# Patient Record
Sex: Male | Born: 1950 | Race: Black or African American | Hispanic: No | Smoking: Never smoker
Health system: Southern US, Community
[De-identification: ages and names within clinical notes are randomized; demographics above are authoritative.]

## PROBLEM LIST (undated history)

## (undated) DIAGNOSIS — G709 Myoneural disorder, unspecified: Secondary | ICD-10-CM

## (undated) DIAGNOSIS — F1910 Other psychoactive substance abuse, uncomplicated: Secondary | ICD-10-CM

## (undated) DIAGNOSIS — N183 Chronic kidney disease, stage 3 (moderate): Secondary | ICD-10-CM

## (undated) DIAGNOSIS — I10 Essential (primary) hypertension: Secondary | ICD-10-CM

## (undated) DIAGNOSIS — J45909 Unspecified asthma, uncomplicated: Secondary | ICD-10-CM

## (undated) DIAGNOSIS — K219 Gastro-esophageal reflux disease without esophagitis: Secondary | ICD-10-CM

## (undated) DIAGNOSIS — F1021 Alcohol dependence, in remission: Secondary | ICD-10-CM

## (undated) DIAGNOSIS — D518 Other vitamin B12 deficiency anemias: Secondary | ICD-10-CM

## (undated) DIAGNOSIS — E785 Hyperlipidemia, unspecified: Secondary | ICD-10-CM

## (undated) DIAGNOSIS — D509 Iron deficiency anemia, unspecified: Secondary | ICD-10-CM

## (undated) DIAGNOSIS — Z8719 Personal history of other diseases of the digestive system: Secondary | ICD-10-CM

## (undated) DIAGNOSIS — D649 Anemia, unspecified: Secondary | ICD-10-CM

## (undated) DIAGNOSIS — J309 Allergic rhinitis, unspecified: Secondary | ICD-10-CM

## (undated) DIAGNOSIS — F528 Other sexual dysfunction not due to a substance or known physiological condition: Secondary | ICD-10-CM

## (undated) DIAGNOSIS — E538 Deficiency of other specified B group vitamins: Secondary | ICD-10-CM

## (undated) DIAGNOSIS — M25569 Pain in unspecified knee: Secondary | ICD-10-CM

## (undated) DIAGNOSIS — Z86010 Personal history of colonic polyps: Secondary | ICD-10-CM

## (undated) HISTORY — DX: Essential (primary) hypertension: I10

## (undated) HISTORY — DX: Alcohol dependence, in remission: F10.21

## (undated) HISTORY — PX: OTHER SURGICAL HISTORY: SHX169

## (undated) HISTORY — DX: Unspecified asthma, uncomplicated: J45.909

## (undated) HISTORY — DX: Other vitamin B12 deficiency anemias: D51.8

## (undated) HISTORY — DX: Hyperlipidemia, unspecified: E78.5

## (undated) HISTORY — PX: VASECTOMY: SHX75

## (undated) HISTORY — DX: Other sexual dysfunction not due to a substance or known physiological condition: F52.8

## (undated) HISTORY — DX: Other psychoactive substance abuse, uncomplicated: F19.10

## (undated) HISTORY — DX: Gastro-esophageal reflux disease without esophagitis: K21.9

## (undated) HISTORY — DX: Anemia, unspecified: D64.9

## (undated) HISTORY — DX: Deficiency of other specified B group vitamins: E53.8

## (undated) HISTORY — DX: Pain in unspecified knee: M25.569

## (undated) HISTORY — PX: COLONOSCOPY: SHX174

## (undated) HISTORY — DX: Personal history of colonic polyps: Z86.010

## (undated) HISTORY — DX: Chronic kidney disease, stage 3 (moderate): N18.3

## (undated) HISTORY — DX: Morbid (severe) obesity due to excess calories: E66.01

## (undated) HISTORY — DX: Allergic rhinitis, unspecified: J30.9

## (undated) HISTORY — DX: Iron deficiency anemia, unspecified: D50.9

---

## 1998-09-12 ENCOUNTER — Emergency Department (HOSPITAL_COMMUNITY): Admission: EM | Admit: 1998-09-12 | Discharge: 1998-09-12 | Payer: Self-pay

## 1998-10-28 ENCOUNTER — Emergency Department (HOSPITAL_COMMUNITY): Admission: EM | Admit: 1998-10-28 | Discharge: 1998-10-28 | Payer: Self-pay | Admitting: Emergency Medicine

## 2000-05-26 ENCOUNTER — Emergency Department (HOSPITAL_COMMUNITY): Admission: EM | Admit: 2000-05-26 | Discharge: 2000-05-26 | Payer: Self-pay | Admitting: Emergency Medicine

## 2000-06-09 ENCOUNTER — Emergency Department (HOSPITAL_COMMUNITY): Admission: EM | Admit: 2000-06-09 | Discharge: 2000-06-10 | Payer: Self-pay | Admitting: Emergency Medicine

## 2000-06-10 ENCOUNTER — Encounter: Payer: Self-pay | Admitting: Emergency Medicine

## 2000-07-13 ENCOUNTER — Emergency Department (HOSPITAL_COMMUNITY): Admission: EM | Admit: 2000-07-13 | Discharge: 2000-07-13 | Payer: Self-pay | Admitting: Internal Medicine

## 2000-08-07 ENCOUNTER — Encounter: Payer: Self-pay | Admitting: Emergency Medicine

## 2000-08-07 ENCOUNTER — Emergency Department (HOSPITAL_COMMUNITY): Admission: EM | Admit: 2000-08-07 | Discharge: 2000-08-07 | Payer: Self-pay | Admitting: Emergency Medicine

## 2000-08-25 ENCOUNTER — Emergency Department (HOSPITAL_COMMUNITY): Admission: EM | Admit: 2000-08-25 | Discharge: 2000-08-25 | Payer: Self-pay | Admitting: Internal Medicine

## 2000-08-25 ENCOUNTER — Encounter: Payer: Self-pay | Admitting: Internal Medicine

## 2000-10-19 ENCOUNTER — Emergency Department (HOSPITAL_COMMUNITY): Admission: EM | Admit: 2000-10-19 | Discharge: 2000-10-20 | Payer: Self-pay | Admitting: Emergency Medicine

## 2000-10-21 ENCOUNTER — Emergency Department (HOSPITAL_COMMUNITY): Admission: EM | Admit: 2000-10-21 | Discharge: 2000-10-22 | Payer: Self-pay | Admitting: Emergency Medicine

## 2000-10-21 ENCOUNTER — Encounter: Payer: Self-pay | Admitting: Emergency Medicine

## 2000-12-17 ENCOUNTER — Emergency Department (HOSPITAL_COMMUNITY): Admission: EM | Admit: 2000-12-17 | Discharge: 2000-12-17 | Payer: Self-pay | Admitting: Emergency Medicine

## 2000-12-17 ENCOUNTER — Inpatient Hospital Stay (HOSPITAL_COMMUNITY): Admission: EM | Admit: 2000-12-17 | Discharge: 2000-12-21 | Payer: Self-pay | Admitting: Emergency Medicine

## 2000-12-20 ENCOUNTER — Encounter: Payer: Self-pay | Admitting: Internal Medicine

## 2001-08-18 ENCOUNTER — Encounter: Payer: Self-pay | Admitting: Pulmonary Disease

## 2001-08-18 ENCOUNTER — Ambulatory Visit (HOSPITAL_COMMUNITY): Admission: RE | Admit: 2001-08-18 | Discharge: 2001-08-18 | Payer: Self-pay | Admitting: Pulmonary Disease

## 2001-08-28 ENCOUNTER — Emergency Department (HOSPITAL_COMMUNITY): Admission: EM | Admit: 2001-08-28 | Discharge: 2001-08-28 | Payer: Self-pay | Admitting: Emergency Medicine

## 2001-08-28 ENCOUNTER — Encounter: Payer: Self-pay | Admitting: Emergency Medicine

## 2002-07-15 ENCOUNTER — Emergency Department (HOSPITAL_COMMUNITY): Admission: EM | Admit: 2002-07-15 | Discharge: 2002-07-16 | Payer: Self-pay | Admitting: Emergency Medicine

## 2002-07-16 ENCOUNTER — Encounter: Payer: Self-pay | Admitting: Emergency Medicine

## 2003-02-04 ENCOUNTER — Encounter: Payer: Self-pay | Admitting: Gastroenterology

## 2003-04-17 ENCOUNTER — Encounter: Payer: Self-pay | Admitting: Emergency Medicine

## 2003-04-17 ENCOUNTER — Emergency Department (HOSPITAL_COMMUNITY): Admission: EM | Admit: 2003-04-17 | Discharge: 2003-04-18 | Payer: Self-pay | Admitting: Emergency Medicine

## 2003-04-25 ENCOUNTER — Emergency Department (HOSPITAL_COMMUNITY): Admission: EM | Admit: 2003-04-25 | Discharge: 2003-04-25 | Payer: Self-pay | Admitting: Emergency Medicine

## 2004-06-21 ENCOUNTER — Encounter: Admission: RE | Admit: 2004-06-21 | Discharge: 2004-06-21 | Payer: Self-pay | Admitting: Internal Medicine

## 2004-06-30 ENCOUNTER — Encounter: Admission: RE | Admit: 2004-06-30 | Discharge: 2004-06-30 | Payer: Self-pay | Admitting: Internal Medicine

## 2005-11-28 ENCOUNTER — Ambulatory Visit: Payer: Self-pay | Admitting: Internal Medicine

## 2005-11-28 LAB — CONVERTED CEMR LAB: PSA: 0.57 ng/mL

## 2006-07-08 ENCOUNTER — Ambulatory Visit: Payer: Self-pay | Admitting: Internal Medicine

## 2006-07-14 ENCOUNTER — Ambulatory Visit: Payer: Self-pay | Admitting: Internal Medicine

## 2006-08-14 ENCOUNTER — Ambulatory Visit: Payer: Self-pay | Admitting: Gastroenterology

## 2006-09-02 ENCOUNTER — Encounter (INDEPENDENT_AMBULATORY_CARE_PROVIDER_SITE_OTHER): Payer: Self-pay | Admitting: Specialist

## 2006-09-02 ENCOUNTER — Ambulatory Visit: Payer: Self-pay | Admitting: Gastroenterology

## 2007-09-21 ENCOUNTER — Ambulatory Visit: Payer: Self-pay | Admitting: Internal Medicine

## 2007-09-21 LAB — CONVERTED CEMR LAB
ALT: 17 units/L (ref 0–53)
AST: 23 units/L (ref 0–37)
Albumin: 4 g/dL (ref 3.5–5.2)
Alkaline Phosphatase: 54 units/L (ref 39–117)
BUN: 6 mg/dL (ref 6–23)
Basophils Absolute: 0 10*3/uL (ref 0.0–0.1)
Basophils Relative: 0.5 % (ref 0.0–1.0)
Bilirubin Urine: NEGATIVE
Bilirubin, Direct: 0.1 mg/dL (ref 0.0–0.3)
CO2: 31 meq/L (ref 19–32)
Calcium: 9.3 mg/dL (ref 8.4–10.5)
Chloride: 107 meq/L (ref 96–112)
Cholesterol: 204 mg/dL (ref 0–200)
Creatinine, Ser: 1.3 mg/dL (ref 0.4–1.5)
Direct LDL: 126.7 mg/dL
Eosinophils Absolute: 0.1 10*3/uL (ref 0.0–0.6)
Eosinophils Relative: 1.5 % (ref 0.0–5.0)
GFR calc Af Amer: 74 mL/min
GFR calc non Af Amer: 61 mL/min
Glucose, Bld: 97 mg/dL (ref 70–99)
HCT: 35.2 % — ABNORMAL LOW (ref 39.0–52.0)
HDL: 39.8 mg/dL (ref >39.0)
Hemoglobin, Urine: NEGATIVE
Hemoglobin: 11.5 g/dL — ABNORMAL LOW (ref 13.0–17.0)
Ketones, ur: NEGATIVE mg/dL
Leukocytes, UA: NEGATIVE
Lymphocytes Relative: 41.4 % (ref 12.0–46.0)
MCHC: 32.8 g/dL (ref 30.0–36.0)
MCV: 80.2 fL (ref 78.0–100.0)
Monocytes Absolute: 0.4 10*3/uL (ref 0.2–0.7)
Monocytes Relative: 5.2 % (ref 3.0–11.0)
Neutro Abs: 3.8 10*3/uL (ref 1.4–7.7)
Neutrophils Relative %: 51.4 % (ref 43.0–77.0)
Nitrite: NEGATIVE
PSA: 0.6 ng/mL
PSA: 0.6 ng/mL (ref 0.10–4.00)
Platelets: 265 10*3/uL (ref 150–400)
Potassium: 4.5 meq/L (ref 3.5–5.1)
RBC: 4.39 M/uL (ref 4.22–5.81)
RDW: 14.2 % (ref 11.5–14.6)
Sodium: 145 meq/L (ref 135–145)
Specific Gravity, Urine: 1.015 (ref 1.000–1.03)
Total Bilirubin: 0.8 mg/dL (ref 0.3–1.2)
Total CHOL/HDL Ratio: 5.1
Total Protein, Urine: NEGATIVE mg/dL
Total Protein: 7.5 g/dL (ref 6.0–8.3)
Triglycerides: 188 mg/dL — ABNORMAL HIGH (ref 0–149)
Urine Glucose: NEGATIVE mg/dL
Urobilinogen, UA: 0.2 (ref 0.0–1.0)
VLDL: 38 mg/dL (ref 0–40)
WBC: 7.3 10*3/uL (ref 4.5–10.5)
pH: 7.5 (ref 5.0–8.0)

## 2007-09-24 ENCOUNTER — Ambulatory Visit: Payer: Self-pay | Admitting: Internal Medicine

## 2007-10-03 ENCOUNTER — Encounter: Payer: Self-pay | Admitting: Internal Medicine

## 2007-10-03 DIAGNOSIS — J309 Allergic rhinitis, unspecified: Secondary | ICD-10-CM | POA: Insufficient documentation

## 2007-10-03 DIAGNOSIS — Z860100 Personal history of colon polyps, unspecified: Secondary | ICD-10-CM | POA: Insufficient documentation

## 2007-10-03 DIAGNOSIS — F528 Other sexual dysfunction not due to a substance or known physiological condition: Secondary | ICD-10-CM

## 2007-10-03 DIAGNOSIS — I10 Essential (primary) hypertension: Secondary | ICD-10-CM | POA: Insufficient documentation

## 2007-10-03 DIAGNOSIS — J45909 Unspecified asthma, uncomplicated: Secondary | ICD-10-CM

## 2007-10-03 DIAGNOSIS — E785 Hyperlipidemia, unspecified: Secondary | ICD-10-CM

## 2007-10-03 DIAGNOSIS — F1021 Alcohol dependence, in remission: Secondary | ICD-10-CM | POA: Insufficient documentation

## 2007-10-03 DIAGNOSIS — K219 Gastro-esophageal reflux disease without esophagitis: Secondary | ICD-10-CM

## 2007-10-03 HISTORY — DX: Personal history of colon polyps, unspecified: Z86.0100

## 2007-10-03 HISTORY — DX: Gastro-esophageal reflux disease without esophagitis: K21.9

## 2007-10-03 HISTORY — DX: Allergic rhinitis, unspecified: J30.9

## 2007-10-03 HISTORY — DX: Hyperlipidemia, unspecified: E78.5

## 2007-10-03 HISTORY — DX: Personal history of colonic polyps: Z86.010

## 2007-10-03 HISTORY — DX: Morbid (severe) obesity due to excess calories: E66.01

## 2007-10-03 HISTORY — DX: Unspecified asthma, uncomplicated: J45.909

## 2007-10-03 HISTORY — DX: Essential (primary) hypertension: I10

## 2007-10-03 HISTORY — DX: Other sexual dysfunction not due to a substance or known physiological condition: F52.8

## 2007-10-03 HISTORY — DX: Alcohol dependence, in remission: F10.21

## 2007-10-08 ENCOUNTER — Ambulatory Visit: Payer: Self-pay | Admitting: Internal Medicine

## 2007-10-08 ENCOUNTER — Encounter: Payer: Self-pay | Admitting: Internal Medicine

## 2007-10-08 DIAGNOSIS — D649 Anemia, unspecified: Secondary | ICD-10-CM

## 2007-10-08 DIAGNOSIS — M25569 Pain in unspecified knee: Secondary | ICD-10-CM | POA: Insufficient documentation

## 2007-10-08 HISTORY — DX: Anemia, unspecified: D64.9

## 2007-10-08 HISTORY — DX: Pain in unspecified knee: M25.569

## 2007-10-13 ENCOUNTER — Encounter: Payer: Self-pay | Admitting: Internal Medicine

## 2007-12-17 HISTORY — PX: OTHER SURGICAL HISTORY: SHX169

## 2007-12-30 ENCOUNTER — Encounter: Payer: Self-pay | Admitting: Internal Medicine

## 2008-01-27 ENCOUNTER — Encounter: Payer: Self-pay | Admitting: Internal Medicine

## 2008-01-27 ENCOUNTER — Encounter: Admission: RE | Admit: 2008-01-27 | Discharge: 2008-01-27 | Payer: Self-pay | Admitting: Orthopedic Surgery

## 2008-02-24 ENCOUNTER — Encounter: Payer: Self-pay | Admitting: Internal Medicine

## 2008-04-27 ENCOUNTER — Encounter: Payer: Self-pay | Admitting: Internal Medicine

## 2008-06-20 ENCOUNTER — Telehealth (INDEPENDENT_AMBULATORY_CARE_PROVIDER_SITE_OTHER): Payer: Self-pay | Admitting: *Deleted

## 2008-09-27 ENCOUNTER — Ambulatory Visit: Payer: Self-pay | Admitting: Internal Medicine

## 2008-09-28 LAB — CONVERTED CEMR LAB
ALT: 14 units/L (ref 0–53)
AST: 19 units/L (ref 0–37)
Albumin: 4.1 g/dL (ref 3.5–5.2)
Alkaline Phosphatase: 52 units/L (ref 39–117)
BUN: 10 mg/dL (ref 6–23)
Basophils Absolute: 0.1 10*3/uL (ref 0.0–0.1)
Basophils Relative: 1.1 % (ref 0.0–3.0)
Bilirubin Urine: NEGATIVE
Bilirubin, Direct: 0.1 mg/dL (ref 0.0–0.3)
CO2: 30 meq/L (ref 19–32)
Calcium: 9 mg/dL (ref 8.4–10.5)
Chloride: 106 meq/L (ref 96–112)
Cholesterol: 174 mg/dL (ref 0–200)
Creatinine, Ser: 1.3 mg/dL (ref 0.4–1.5)
Direct LDL: 96.8 mg/dL
Eosinophils Absolute: 0.1 10*3/uL (ref 0.0–0.7)
Eosinophils Relative: 1.6 % (ref 0.0–5.0)
Folate: 5.8 ng/mL
GFR calc Af Amer: 73 mL/min
GFR calc non Af Amer: 61 mL/min
Glucose, Bld: 92 mg/dL (ref 70–99)
HCT: 33 % — ABNORMAL LOW (ref 39.0–52.0)
HDL: 37 mg/dL — ABNORMAL LOW (ref >39.0)
Hemoglobin, Urine: NEGATIVE
Hemoglobin: 11.2 g/dL — ABNORMAL LOW (ref 13.0–17.0)
Iron: 51 ug/dL (ref 42–165)
Ketones, ur: NEGATIVE mg/dL
Leukocytes, UA: NEGATIVE
Lymphocytes Relative: 38.8 % (ref 12.0–46.0)
MCHC: 33.9 g/dL (ref 30.0–36.0)
MCV: 80.7 fL (ref 78.0–100.0)
Monocytes Absolute: 0.4 10*3/uL (ref 0.1–1.0)
Monocytes Relative: 5.2 % (ref 3.0–12.0)
Neutro Abs: 3.6 10*3/uL (ref 1.4–7.7)
Neutrophils Relative %: 53.3 % (ref 43.0–77.0)
Nitrite: NEGATIVE
PSA: 0.55 ng/mL (ref 0.10–4.00)
Platelets: 245 10*3/uL (ref 150–400)
Potassium: 4.4 meq/L (ref 3.5–5.1)
RBC: 4.09 M/uL — ABNORMAL LOW (ref 4.22–5.81)
RDW: 14.4 % (ref 11.5–14.6)
Saturation Ratios: 12.2 % — ABNORMAL LOW (ref 20.0–50.0)
Sodium: 143 meq/L (ref 135–145)
Specific Gravity, Urine: 1.025 (ref 1.000–1.03)
TSH: 3.13 microintl units/mL (ref 0.35–5.50)
Total Bilirubin: 0.7 mg/dL (ref 0.3–1.2)
Total CHOL/HDL Ratio: 4.7
Total Protein, Urine: NEGATIVE mg/dL
Total Protein: 7.1 g/dL (ref 6.0–8.3)
Transferrin: 298.6 mg/dL (ref >=212.0)
Triglycerides: 246 mg/dL (ref 0–149)
Urine Glucose: NEGATIVE mg/dL
Urobilinogen, UA: 0.2 (ref 0.0–1.0)
VLDL: 49 mg/dL — ABNORMAL HIGH (ref 0–40)
Vitamin B-12: 181 pg/mL — ABNORMAL LOW (ref 211–911)
WBC: 6.9 10*3/uL (ref 4.5–10.5)
pH: 6 (ref 5.0–8.0)

## 2008-10-06 ENCOUNTER — Ambulatory Visit: Payer: Self-pay | Admitting: Internal Medicine

## 2008-10-06 DIAGNOSIS — E538 Deficiency of other specified B group vitamins: Secondary | ICD-10-CM

## 2008-10-06 HISTORY — DX: Deficiency of other specified B group vitamins: E53.8

## 2008-11-24 ENCOUNTER — Encounter: Payer: Self-pay | Admitting: Internal Medicine

## 2009-03-16 HISTORY — PX: OTHER SURGICAL HISTORY: SHX169

## 2009-04-18 ENCOUNTER — Inpatient Hospital Stay (HOSPITAL_COMMUNITY): Admission: RE | Admit: 2009-04-18 | Discharge: 2009-04-23 | Payer: Self-pay | Admitting: Orthopedic Surgery

## 2009-09-25 ENCOUNTER — Telehealth: Payer: Self-pay | Admitting: Internal Medicine

## 2009-09-26 ENCOUNTER — Telehealth: Payer: Self-pay | Admitting: Internal Medicine

## 2009-10-30 ENCOUNTER — Telehealth: Payer: Self-pay | Admitting: Internal Medicine

## 2009-11-23 ENCOUNTER — Ambulatory Visit: Payer: Self-pay | Admitting: Internal Medicine

## 2009-11-23 LAB — CONVERTED CEMR LAB
ALT: 18 units/L (ref 0–53)
AST: 22 units/L (ref 0–37)
Albumin: 3.9 g/dL (ref 3.5–5.2)
Alkaline Phosphatase: 52 units/L (ref 39–117)
BUN: 5 mg/dL — ABNORMAL LOW (ref 6–23)
Basophils Absolute: 0 10*3/uL (ref 0.0–0.1)
Basophils Relative: 0.7 % (ref 0.0–3.0)
Bilirubin Urine: NEGATIVE
Bilirubin, Direct: 0.1 mg/dL (ref 0.0–0.3)
CO2: 30 meq/L (ref 19–32)
Calcium: 9.2 mg/dL (ref 8.4–10.5)
Chloride: 107 meq/L (ref 96–112)
Cholesterol: 187 mg/dL (ref 0–200)
Creatinine, Ser: 1.4 mg/dL (ref 0.4–1.5)
Eosinophils Absolute: 0.1 10*3/uL (ref 0.0–0.7)
Eosinophils Relative: 1.5 % (ref 0.0–5.0)
GFR calc non Af Amer: 67.1 mL/min (ref >60)
Glucose, Bld: 94 mg/dL (ref 70–99)
HCT: 30.9 % — ABNORMAL LOW (ref 39.0–52.0)
HDL: 45.5 mg/dL (ref >39.00)
Hemoglobin, Urine: NEGATIVE
Hemoglobin: 10 g/dL — ABNORMAL LOW (ref 13.0–17.0)
Ketones, ur: NEGATIVE mg/dL
LDL Cholesterol: 118 mg/dL — ABNORMAL HIGH (ref 0–99)
Leukocytes, UA: NEGATIVE
Lymphocytes Relative: 41 % (ref 12.0–46.0)
Lymphs Abs: 2.6 10*3/uL (ref 0.7–4.0)
MCHC: 32.4 g/dL (ref 30.0–36.0)
MCV: 79 fL (ref 78.0–100.0)
Monocytes Absolute: 0.3 10*3/uL (ref 0.1–1.0)
Monocytes Relative: 5.5 % (ref 3.0–12.0)
Neutro Abs: 3.3 10*3/uL (ref 1.4–7.7)
Neutrophils Relative %: 51.3 % (ref 43.0–77.0)
Nitrite: NEGATIVE
PSA: 0.68 ng/mL (ref 0.10–4.00)
Platelets: 242 10*3/uL (ref 150.0–400.0)
Potassium: 4.5 meq/L (ref 3.5–5.1)
RBC: 3.91 M/uL — ABNORMAL LOW (ref 4.22–5.81)
RDW: 16.4 % — ABNORMAL HIGH (ref 11.5–14.6)
Sodium: 145 meq/L (ref 135–145)
Specific Gravity, Urine: 1.015 (ref 1.000–1.030)
TSH: 1.56 microintl units/mL (ref 0.35–5.50)
Total Bilirubin: 0.7 mg/dL (ref 0.3–1.2)
Total CHOL/HDL Ratio: 4
Total Protein, Urine: NEGATIVE mg/dL
Total Protein: 7.4 g/dL (ref 6.0–8.3)
Triglycerides: 120 mg/dL (ref 0.0–149.0)
Urine Glucose: NEGATIVE mg/dL
Urobilinogen, UA: 0.2 (ref 0.0–1.0)
VLDL: 24 mg/dL (ref 0.0–40.0)
WBC: 6.3 10*3/uL (ref 4.5–10.5)
pH: 6.5 (ref 5.0–8.0)

## 2009-11-24 LAB — CONVERTED CEMR LAB
Folate: 4.8 ng/mL
Iron: 38 ug/dL — ABNORMAL LOW (ref 42–165)
Saturation Ratios: 9.1 % — ABNORMAL LOW (ref 20.0–50.0)
Transferrin: 298.9 mg/dL (ref 212.0–360.0)
Vitamin B-12: 188 pg/mL — ABNORMAL LOW (ref 211–911)

## 2009-11-27 ENCOUNTER — Ambulatory Visit: Payer: Self-pay | Admitting: Internal Medicine

## 2009-11-27 DIAGNOSIS — D509 Iron deficiency anemia, unspecified: Secondary | ICD-10-CM | POA: Insufficient documentation

## 2009-11-27 HISTORY — DX: Iron deficiency anemia, unspecified: D50.9

## 2009-12-04 ENCOUNTER — Ambulatory Visit: Payer: Self-pay | Admitting: Gastroenterology

## 2009-12-04 ENCOUNTER — Encounter (INDEPENDENT_AMBULATORY_CARE_PROVIDER_SITE_OTHER): Payer: Self-pay | Admitting: *Deleted

## 2009-12-04 DIAGNOSIS — D518 Other vitamin B12 deficiency anemias: Secondary | ICD-10-CM

## 2009-12-04 HISTORY — DX: Other vitamin B12 deficiency anemias: D51.8

## 2009-12-04 LAB — CONVERTED CEMR LAB: IgA: 404 mg/dL — ABNORMAL HIGH (ref 68–378)

## 2009-12-05 LAB — CONVERTED CEMR LAB: Tissue Transglutaminase Ab, IgA: 1.3 units (ref <7)

## 2009-12-25 ENCOUNTER — Telehealth: Payer: Self-pay | Admitting: Internal Medicine

## 2010-01-03 ENCOUNTER — Ambulatory Visit: Payer: Self-pay | Admitting: Gastroenterology

## 2010-01-03 HISTORY — PX: POLYPECTOMY: SHX149

## 2010-01-03 LAB — HM COLONOSCOPY

## 2010-01-04 ENCOUNTER — Encounter: Payer: Self-pay | Admitting: Gastroenterology

## 2010-05-01 ENCOUNTER — Telehealth: Payer: Self-pay | Admitting: Internal Medicine

## 2011-01-02 ENCOUNTER — Telehealth: Payer: Self-pay | Admitting: Internal Medicine

## 2011-01-06 ENCOUNTER — Encounter: Payer: Self-pay | Admitting: Internal Medicine

## 2011-01-17 NOTE — Procedures (Signed)
Summary: Colonoscopy and biopsy   Colonoscopy  Procedure date:  09/02/2006  Findings:      Results: Polyp.  Results: Diverticulosis.       Location:  [facility].    Procedures Next Due Date:    Colonoscopy: 09/2011  Colonoscopy  Procedure date:  09/02/2006  Findings:      Results: Polyp.  Results: Diverticulosis.       Location:  [facility].    Procedures Next Due Date:    Colonoscopy: 09/2011 Patient Name: Darrell Hoffman, Darrell Hoffman MRN:  Procedure Procedures: Colonoscopy CPT: [phone].    with Hot Biopsy(s)CPT: [id number].  Personnel: Endoscopist: Venita Lick. Russella Dar, MD, Clementeen Graham.  Exam Location: Exam performed in Outpatient Clinic. Outpatient  Patient Consent: Procedure, Alternatives, Risks and Benefits discussed, consent obtained, from patient. Consent was obtained by the RN.  Indications  Surveillance of: Adenomatous Polyp(s). Initial polypectomy was performed in 2004. in Feb. Largest polyp removed was > 19 mm. Pathology of worst  polyp: tubulovillous adenoma.  History  Current Medications: Patient is not currently taking Coumadin.  Pre-Exam Physical: Performed Sep 02, 2006. Cardio-pulmonary exam, Rectal exam, HEENT exam , Abdominal exam, Mental status exam WNL.  Comments: Pt. history reviewed/updated, physical exam performed prior to initiation of sedation?Yes Exam Exam: Extent of exam reached: Cecum, extent intended: Cecum.  The cecum was identified by appendiceal orifice and IC valve. Time to Cecum: 00:02: 04. Time for Withdrawl: 00:09:35. Colon retroflexion performed. ASA Classification: II. Tolerance: excellent.  Monitoring: Pulse and BP monitoring, Oximetry used. Supplemental O2 given.  Colon Prep Used MoviPrep for colon prep. Prep results: excellent.  Sedation Meds: Patient assessed and found to be appropriate for moderate (conscious) sedation. Fentanyl 75 mcg. given IV. Versed 6 mg. given IV.  Findings DIVERTICULOSIS: Transverse  Colon. Not bleeding. ICD9: Diverticulosis: 562.10. Comments: single wide mouthed diverticulum.  POLYP: Transverse Colon, Maximum size: 3 mm. sessile polyp. Procedure:  hot biopsy, removed, retrieved, Polyp sent to pathology. ICD9: Colon Polyps: 211.3.  NORMAL EXAM: Cecum to Hepatic Flexure.  NORMAL EXAM: Splenic Flexure to Rectum.   Assessment  Diagnoses: 562.10: Diverticulosis.  211.3: Colon Polyps.   Events  Unplanned Interventions: No intervention was required.  Unplanned Events: There were no complications. Plans  Post Exam Instructions: Post sedation instructions given. No aspirin or non-steroidal containing medications: 2 weeks.  Medication Plan: Await pathology.  Patient Education: Patient given standard instructions for: Polyps. Diverticulosis.  Disposition: After procedure patient sent to recovery. After recovery patient sent home.  Scheduling/Referral: Colonoscopy, to [facility] T. Russella Dar, MD, [facility], around Sep 03, 2011.    This report was created from the original endoscopy report, which was reviewed and signed by the above listed endoscopist.    cc: [name], MD         SP Surgical Pathology - STATUS: Final             By: Windy Kalata,      Perform Date: 104Sep07 00:01  Ordered By: Rica Records Date: 19Sep07 14:06  Facility: [facility]                               Department: CPATH  Service Report Text  [facility]   [address] [address]   [place], [place] [address]   Telephone [phone] or [phone] Fax [phone]  REPORT OF SURGICAL PATHOLOGY    Case #: [id number]   Patient Name: [name], [name]   Office Chart Number: [id number]    MRN: [id number]   Pathologist: Alden Server A. Delila Spence, MD   DOB/Age 19-Oct-1951 (Age: 60) Gender: M   Date Taken: 09/02/2006   Date Received: 09/03/2006    FINAL DIAGNOSIS    ***MICROSCOPIC EXAMINATION AND DIAGNOSIS***    TRANSVERSE COLON, POLYP(S):  HYPERPLASTIC POLYP(S). NO   ADENOMATOUS CHANGE OR MALIGNANCY IDENTIFIED.    cf   Date Reported: 09/04/2006 Alden Server A. Delila Spence, MD   *** Electronically Signed Out By EAA ***    Clinical information   History of colon polyp. R/O adenomatous changes (jy)    specimen(s) obtained   Colon, polyp(s), transverse    Gross Description   Received in formalin is a tan, soft tissue fragment that is   submitted in toto. Size: 0.2 cm. One block.   (TB:caf 09/03/06)    cf/

## 2011-01-17 NOTE — Consult Note (Signed)
Summary: [facility]   Imported By: Esmeralda Links D'jimraou 01/05/2008 12:02:12  _____________________________________________________________________  External Attachment:    Type:   Image     Comment:   External Document

## 2011-01-17 NOTE — Progress Notes (Signed)
  Phone Note Call from Patient Call back at Home Phone [phone]   Caller: [phone] Call For: [name] MD Summary of Call: Pt sched appt for CPX w/Dr Jonny Ruiz, the next available slot is April,pt made appt in April. Pt needs heartburn meds called in to pharmacy as he is out. Initial call taken by: Verdell Face,  January 02, 2011 9:48 AM    Prescriptions: OMEPRAZOLE 20 MG CPDR (OMEPRAZOLE) 1 by mouth 2 times a day  #60 x 4   Entered by:   Scharlene Gloss CMA (AAMA)   Authorized by:   [name] MD   Signed by:   Scharlene Gloss CMA (AAMA) on 01/02/2011   Method used:   Faxed to ...       [facility] [phone]* (retail)       [address]       [address]       [place], [place]  [address]       Ph: [phone]       Fax: [phone]   RxID:   [id number]

## 2011-01-17 NOTE — Assessment & Plan Note (Signed)
Summary: CPX/ NWS /#   Vital Signs:  Patient profile:   60 year old male Height:      65 inches Weight:      218 pounds BMI:     36.41 O2 Sat:      98 % on Room air Temp:     98.5 degrees F oral Pulse rate:   74 / minute BP sitting:   142 / 84  (left arm) Cuff size:   large  Vitals Entered ByZella Ball Ewing (November 27, 2009 10:50 AM)  O2 Flow:  Room air  CC: Adult Physical/RE   CC:  Adult Physical/RE.  History of Present Illness: overall doing well, no complaints  Problems Prior to Update: 1)  Anemia-iron Deficiency  (ICD-280.9) 2)  B12 Deficiency  (ICD-266.2) 3)  Preventive Health Care  (ICD-V70.0) 4)  Knee Pain, Right  (ICD-719.46) 5)  Family History of Cad Male 1st Degree Relative <60  (ICD-V16.49) 6)  Anemia-nos  (ICD-285.9) 7)  Alcohol Abuse, Hx of  (ICD-V11.3) 8)  Colonic Polyps, Hx of  (ICD-V12.72) 9)  Hyperlipidemia  (ICD-272.4) 10)  Hypertension  (ICD-401.9) 11)  Erectile Dysfunction  (ICD-302.72) 12)  Morbid Obesity  (ICD-278.01) 13)  Allergic Rhinitis  (ICD-477.9) 14)  Gerd  (ICD-530.81) 15)  Asthma  (ICD-493.90)  Medications Prior to Update: 1)  Advair Diskus 500-50 Mcg/dose Misc (Fluticasone-Salmeterol) .Marland Kitchen.. 1 Puff Two Times A Day 2)  Cialis 20 Mg Tabs (Tadalafil) .Marland Kitchen.. 1 By Mouth Once Daily As Needed 3)  Fluticasone Propionate 50 Mcg/act Susp (Fluticasone Propionate) .... 2 Spray/side Once Daily 4)  Lansoprazole 30 Mg Cpdr (Lansoprazole) .Marland Kitchen.. 1po Once Daily 5)  Pravastatin Sodium 40 Mg Tabs (Pravastatin Sodium) .... Take 1 Tablet By Mouth Once A Day 6)  Proventil Hfa 108 (90 Base) Mcg/act Aers (Albuterol Sulfate) .... Inhale 2 Puff As Directed Four Times A Day 7)  Singulair 10 Mg Tabs (Montelukast Sodium) .Marland Kitchen.. 1 By Mouth Once Daily 8)  Diclofenac Sodium 75 Mg  Tbec (Diclofenac Sodium) .Marland Kitchen.. 1 By Mouth Two Times A Day Prn 9)  Cyanocobalamin 1000 Mcg Q Mo 10)  Adult Aspirin Ec Low Strength 81 Mg Tbec (Aspirin) .Marland Kitchen.. 1 By Mouth Once Daily 11)   Omeprazole 20 Mg Cpdr (Omeprazole) .Marland Kitchen.. 1 By Mouth 2 Times A Day  Current Medications (verified): 1)  Advair Diskus 500-50 Mcg/dose Misc (Fluticasone-Salmeterol) .Marland Kitchen.. 1 Puff Two Times A Day 2)  Cialis 20 Mg Tabs (Tadalafil) .Marland Kitchen.. 1 By Mouth Once Daily As Needed 3)  Fluticasone Propionate 50 Mcg/act Susp (Fluticasone Propionate) .... 2 Spray/side Once Daily 4)  Lansoprazole 30 Mg Cpdr (Lansoprazole) .Marland Kitchen.. 1po Once Daily 5)  Pravastatin Sodium 40 Mg Tabs (Pravastatin Sodium) .... Take 1 Tablet By Mouth Once A Day 6)  Proventil Hfa 108 (90 Base) Mcg/act Aers (Albuterol Sulfate) .... Inhale 2 Puff As Directed Four Times A Day 7)  Singulair 10 Mg Tabs (Montelukast Sodium) .Marland Kitchen.. 1 By Mouth Once Daily 8)  Diclofenac Sodium 75 Mg  Tbec (Diclofenac Sodium) .Marland Kitchen.. 1 By Mouth Two Times A Day Prn 9)  Cyanocobalamin 1000 Mcg Q Mo 10)  Adult Aspirin Ec Low Strength 81 Mg Tbec (Aspirin) .Marland Kitchen.. 1 By Mouth Once Daily 11)  Omeprazole 20 Mg Cpdr (Omeprazole) .Marland Kitchen.. 1 By Mouth 2 Times A Day 12)  Ferrous Sulfate 325 (65 Fe) Mg Tabs (Ferrous Sulfate) .Marland Kitchen.. 1po Once Daily  Allergies (verified): No Known Drug Allergies  Past History:  Family History: Last updated: 10/06/2008 Family History of CAD Male  1st degree relative - MI at 63 yo sister with SLE  2 cousins with DM mother with cancer ? breast ocusin and uncle with ETOH sister with HTN  Social History: Last updated: 10/06/2008 Never Smoked Alcohol use-no Divorced 2 children work Games developer - [employer]  Risk Factors: Smoking Status: never (10/08/2007)  Past Medical History: Asthma GERD Allergic rhinitis Obesity Erectile Dysfunction Hypertension Hyperlipidemia Colonic polyps, hx of Hx of Alcohol Dependence Anemia-NOS Anemia-iron deficiency  Past Surgical History: Vasectomy Bilateral Hydrocele Repair s/p right knee surgury   - TKR  - april 2010 s/p left knee arthroscopy 2009  Review of Systems  The patient denies  anorexia, fever, weight loss, weight gain, vision loss, decreased hearing, hoarseness, chest pain, syncope, dyspnea on exertion, peripheral edema, prolonged cough, headaches, hemoptysis, abdominal pain, melena, hematochezia, severe indigestion/heartburn, hematuria, incontinence, muscle weakness, suspicious skin lesions, transient blindness, difficulty walking, depression, unusual weight change, abnormal bleeding, enlarged lymph nodes, and angioedema.         all otherwise negative per pt - except for some hand joint pains off and on  Physical Exam  General:  alert and overweight-appearing.   Head:  normocephalic and atraumatic.   Eyes:  vision grossly intact, pupils equal, and pupils round.   Ears:  R ear normal and L ear normal.   Nose:  no external deformity and no nasal discharge.   Mouth:  no gingival abnormalities and pharynx pink and moist.   Neck:  supple and no masses.   Lungs:  normal respiratory effort and normal breath sounds.   Heart:  normal rate and regular rhythm.   Abdomen:  soft, non-tender, and normal bowel sounds.   Msk:  no joint tenderness and no joint swelling.   Extremities:  no edema, no erythema  Neurologic:  cranial nerves II-XII intact and strength normal in all extremities.     Impression & Recommendations:  Problem # 1:  Preventive Health Care (ICD-V70.0)  Overall doing well, up to date, counseled on routine health concerns for screening and prevention, immunizations up to date or declined, labs reviewed, ecg reviewed  Orders: EKG w/ Interpretation (93000)  Problem # 2:  B12 DEFICIENCY (ICD-266.2) to start b12 shots monthly  Problem # 3:  ANEMIA-IRON DEFICIENCY (ICD-280.9)  His updated medication list for this problem includes:    Ferrous Sulfate 325 (65 Fe) Mg Tabs (Ferrous sulfate) .Marland Kitchen... 1po once daily  Orders: Gastroenterology Referral (GI)  Complete Medication List: 1)  Advair Diskus 500-50 Mcg/dose Misc (Fluticasone-salmeterol) .Marland Kitchen.. 1 puff  two times a day 2)  Cialis 20 Mg Tabs (Tadalafil) .Marland Kitchen.. 1 by mouth once daily as needed 3)  Fluticasone Propionate 50 Mcg/act Susp (Fluticasone propionate) .... 2 spray/side once daily 4)  Lansoprazole 30 Mg Cpdr (Lansoprazole) .Marland Kitchen.. 1po once daily 5)  Pravastatin Sodium 40 Mg Tabs (Pravastatin sodium) .... Take 1 tablet by mouth once a day 6)  Proventil Hfa 108 (90 Base) Mcg/act Aers (Albuterol sulfate) .... Inhale 2 puff as directed four times a day 7)  Singulair 10 Mg Tabs (Montelukast sodium) .Marland Kitchen.. 1 by mouth once daily 8)  Diclofenac Sodium 75 Mg Tbec (Diclofenac sodium) .Marland Kitchen.. 1 by mouth two times a day prn 9)  Cyanocobalamin 1000 Mcg Q Mo  10)  Adult Aspirin Ec Low Strength 81 Mg Tbec (Aspirin) .Marland Kitchen.. 1 by mouth once daily 11)  Omeprazole 20 Mg Cpdr (Omeprazole) .Marland Kitchen.. 1 by mouth 2 times a day 12)  Ferrous Sulfate 325 (65 Fe) Mg Tabs (Ferrous  sulfate) .Marland Kitchen.. 1po once daily  Other Orders: Admin 1st Vaccine (91478) Flu Vaccine 66yrs + [phone])  Patient Instructions: 1)  you had the flu shot today 2)  your EKG was good today 3)  you had the B12 shot today 4)  please make nurse visit appt's for montly b12 shots in the future 5)  start OTC iron sulfate 325 nmg - 1 per day 6)  You will be contacted about the referral(s) to: GI 7)  Please schedule a follow-up appointment in 1 year or sooner if needed 8)  Check your Blood Pressure regularly. If it is above 140/90: you should make an appointment.    Flu Vaccine Consent Questions     Do you have a history of severe allergic reactions to this vaccine? no    Any prior history of allergic reactions to egg and/or gelatin? no    Do you have a sensitivity to the preservative Thimersol? no    Do you have a past history of Guillan-Barre Syndrome? no    Do you currently have an acute febrile illness? no    Have you ever had a severe reaction to latex? no    Vaccine information given and explained to patient? yes    Are you currently pregnant? no    Lot  Number:[id number]   Exp Date:06/14/2010   Site Given  Left Deltoid IMflu   Appended Document: Immunization Entry      Medication Administration  Injection # 1:    Medication: Vit B12 1000 mcg    Diagnosis: B12 DEFICIENCY (ICD-266.2)    Route: IM    Site: R deltoid    Exp Date: 08/2011    Lot #: [id number]    Mfr: [employer] Regent    Given by: Zella Ball Ewing (November 27, 2009 11:48 AM)  Orders Added: 1)  Admin of Therapeutic Inj  intramuscular or subcutaneous [96372] 2)  Vit B12 1000 mcg [J3420]

## 2011-01-17 NOTE — Progress Notes (Signed)
Summary: nexium   Phone Note Call from Patient Call back at Home Phone [phone]   Caller: [phone] Call For: Bradly Bienenstock Summary of Call: per pt call need an Emergency  supply  of nexium  called into his pharmacy until prior authorization is completed . call in 10 pill loaner   to [facility] [address]  [phone]  June 20, 2008 2:31 PM called  [employer] [phone] stated peior authorization  form will take up to 24-48 to fax    Initial call taken by: Shelbie Proctor,  June 20, 2008 4:34 PM  Follow-up for Phone Call        Nexium 40 Take 1 tablet by mouth once a day  called in to pharmacy per pt request. Pharmacy called back stating that this is nmot a refill request. pt need PA Follow-up by: Rock Nephew CMA,  June 21, 2008 8:58 AM  Additional Follow-up for Phone Call Additional follow up Details #1::        June 21, 2008 9:26 AM  called insurance company  stated that forms take about 24 -48 to fax pt stated forms were faxed i called insurance company did not fax yet informed pt that a soon as we received forms we will notifiey him asap pt wanted to do over the phone  but did not know pt dx  for the nexium has to call for pt chart  . asked to fax form so the dr could gv the dx Additional Follow-up by: Shelbie Proctor,  June 21, 2008 9:28 AM    Additional Follow-up for Phone Call Additional follow up Details #2::     is there  any samples or medication  that we can provide for the pt until prior authorization is approved for his nexium . pt called  4 times already. upset pls advised.Marland KitchenMarland KitchenJuly  7, 2009 10:18 AM  pt  called  again was very  rude and  Argumentative.  that we the office so slow with the forms filling them out which we havent received them , so  did prior authorization over the phone dx was gerd . spoke with a  representative bcbs insurance approved pt until 2012 for nexium will contact pt pharmacy to inform [facility]  [phone]. now prior authorization has been  completed Follow-up by: Shelbie Proctor,  June 21, 2008 10:01 AM    Prescriptions: NEXIUM 40 MG CPDR (ESOMEPRAZOLE MAGNESIUM)   #10 x 0   Entered by:   Rock Nephew CMA   Authorized by:   [name] MD   Signed by:   Rock Nephew CMA on 06/21/2008   Method used:   Telephoned to ...       [facility] # [phone]*       [address]       [place], [place]  [address]       Ph: [phone] or [phone]       Fax: [phone]   RxID:   [id number]

## 2011-01-17 NOTE — Procedures (Signed)
Summary: Upper Endoscopy  Patient: [name] Note: All result statuses are Final unless otherwise noted.  Tests: (1) Upper Endoscopy (EGD)   EGD Upper Endoscopy       DONE     [facility]     [address]     [place], [place]  [address]           ENDOSCOPY PROCEDURE REPORT           PATIENT:  Darrell Hoffman, Darrell Hoffman  MR#:  [id number]     BIRTHDATE:  March 14, 1951, 58 yrs. old  GENDER:  male           ENDOSCOPIST:  Judie Petit T. Russella Dar, MD, [facility]           PROCEDURE DATE:  01/03/2010     PROCEDURE:  EGD with biopsy     ASA CLASS:  Class II     INDICATIONS:  iron deficiency anemia, GERD           MEDICATIONS:  There was residual sedation effect present from     prior procedure, Fentanyl 50 mcg IV, Versed 4 mg IV     TOPICAL ANESTHETIC:  Exactacain Spray           DESCRIPTION OF PROCEDURE:   After the risks benefits and     alternatives of the procedure were thoroughly explained, informed     consent was obtained.  The LB [id number] endoscope was     introduced through the mouth and advanced to the second portion of     the duodenum, without limitations.  The instrument was slowly     withdrawn as the mucosa was fully examined.     <<PROCEDUREIMAGES>>           A hiatal hernia was found in the fundus. It was large. It was 8 cm     in length. Cameron erosions were not noted.  The esophagus and     gastroesophageal junction were completely normal in appearance.     The duodenal bulb was normal in appearance, as was the postbulbar     duodenum. Random biopsies were obtained and sent to pathology.     The stomach was entered and closely examined. The pylorus, antrum,     angularis, and lesser curvature were well visualized and were     normal. The stomach wall was normally distensable. The scope     passed easily through the pylorus into the duodenum.     Retroflexed views revealed  findings as previously described.  The     scope was then withdrawn from the patient and the  procedure     completed.           COMPLICATIONS:  None     ENDOSCOPIC IMPRESSION:     1) 8 cm hiatal hernia in the fundus           RECOMMENDATIONS:     1) await pathology results     2) anti-reflux regimen     3) continue PPI     4) follow up with Dr. Alita Chyle T. Russella Dar, MD, Clement[facility] Graham           CC:  [name], MD           n.     Rosalie DoctorVenita Lick. Lawana Hartzell at 01/03/2010 11:24 AM           Drue Second,  [phone]  Note: An exclamation mark (!) indicates a result that was not dispersed into the flowsheet. Document Creation Date: 01/03/2010 11:25 AM _______________________________________________________________________  (1) Order result status: Final Collection or observation date-time: 01/03/2010 11:19 Requested date-time:  Receipt date-time:  Reported date-time:  Referring Physician:   Ordering Physician: Claudette Head [id number]) Specimen Source:  Source: Launa Grill Order Number: [id number] Lab site:

## 2011-01-17 NOTE — Progress Notes (Signed)
  Phone Note Refill Request  on October 30, 2009 8:47 AM  Refills Requested: Medication #1:  SINGULAIR 10 MG TABS 1 by mouth once daily   Dosage confirmed as above?Dosage Confirmed   Notes: [facility] Initial call taken by: Scharlene Gloss,  October 30, 2009 8:48 AM    Prescriptions: SINGULAIR 10 MG TABS (MONTELUKAST SODIUM) 1 by mouth once daily  #30 x 11   Entered by:   Zella Ball Ewing   Authorized by:   [name] MD   Signed by:   Scharlene Gloss on 10/30/2009   Method used:   Electronically to        [facility] [address] [phone]* (retail)       [address]       [place], [place]  [address]       Ph: [phone] or [phone]       Fax: [phone]   RxID:   [id number]

## 2011-01-17 NOTE — Procedures (Signed)
Summary: Colonoscopy   Colonoscopy  Procedure date:  02/04/2003  Findings:      Results: Polyp.  Location:  [facility].    Procedures Next Due Date:    Colonoscopy: 02/2006  Colonoscopy  Procedure date:  02/04/2003  Findings:      Results: Polyp.  Location:  [facility].    Procedures Next Due Date:    Colonoscopy: 02/2006 Patient Name: Darrell Hoffman, Darrell Hoffman MRN:  Procedure Procedures: Colonoscopy CPT: [phone].    with Hot Biopsy(s)CPT: [id number].    with polypectomy. CPT: [id number].  Personnel: Endoscopist: Venita Lick. Russella Dar, MD, Clementeen Graham.  Referred By: [name], MD.  Exam Location: Exam performed in Outpatient Clinic. Outpatient  Patient Consent: Procedure, Alternatives, Risks and Benefits discussed, consent obtained, from patient. Consent was obtained by the RN.  Indications  Average Risk Screening Routine.  History  Pre-Exam Physical: Performed Feb 04, 2003. Entire physical exam was normal.  Exam Exam: Extent of exam reached: Cecum, extent intended: Cecum.  The cecum was identified by appendiceal orifice and IC valve. Colon retroflexion performed. ASA Classification: II. Tolerance: good.  Monitoring: Pulse and BP monitoring, Oximetry used. Supplemental O2 given.  Colon Prep Used Golytely for colon prep. Prep results: good.  Sedation Meds: Patient assessed and found to be appropriate for moderate (conscious) sedation. Fentanyl 100 mcg. given IV. Versed 8 mg. given IV.  Findings POLYP: Transverse Colon, Maximum size: 20 mm. pedunculated polyp. Procedure:  snare with cautery, removed, retrieved, Polyp sent to pathology. ICD9: Colon Polyps: 211.3.  NORMAL EXAM: Descending Colon to Splenic Flexure.  NORMAL EXAM: Cecum to Hepatic Flexure.  MULTIPLE POLYPS: Sigmoid Colon. minimum size 2 mm, maximum size 3 mm. Procedure:  hot biopsy, removed, retrieved, 2 polyps Polyps sent to pathology. ICD9: Colon Polyps: 211.3.  NORMAL EXAM: Rectum.     Assessment  Diagnoses: 211.3: Colon Polyps.   Events  Unplanned Interventions: No intervention was required.  Unplanned Events: There were no complications. Plans  Post Exam Instructions: No aspirin or non-steroidal containing medications: 2 weeks.  Medication Plan: Await pathology. Continue current medications.  Patient Education: Patient given standard instructions for: Polyps.  Disposition: After procedure patient sent to recovery. After recovery patient sent home.  Scheduling/Referral: Colonoscopy, to [facility] T. Russella Dar, MD, [facility], around Feb 04, 2006.  Primary Care Provider, to [name], MD,    This report was created from the original endoscopy report, which was reviewed and signed by the above listed endoscopist.    UE:AVWUJ Ellin Mayhew, MD

## 2011-01-17 NOTE — Progress Notes (Signed)
Summary: Rx refill req  Phone Note Call from Patient   Caller: Patient [phone] Summary of Call: pt called requesting refill of Albuterol. Rx was removed 12/10 by Dr. Ardell Isaacs office. Okay to put back on med list and refill? Initial call taken by: Margaret Pyle, CMA,  May 01, 2010 11:08 AM  Follow-up for Phone Call        ok - to robin to handle Follow-up by: [name] MD,  May 01, 2010 12:51 PM    New/Updated Medications: PROVENTIL HFA 108 (90 BASE) MCG/ACT AERS (ALBUTEROL SULFATE) inhale 2 puffs as directed four times a day Prescriptions: PROVENTIL HFA 108 (90 BASE) MCG/ACT AERS (ALBUTEROL SULFATE) inhale 2 puffs as directed four times a day  #1 x 8   Entered by:   Scharlene Gloss   Authorized by:   [name] MD   Signed by:   Scharlene Gloss on 05/01/2010   Method used:   Faxed to ...       [facility] [phone]* (retail)       [address]       [address]       [place], [place]  [address]       Ph: [phone]       Fax: [phone]   RxID:   [id number]

## 2011-01-17 NOTE — Consult Note (Signed)
Summary: [facility]   Imported By: Maryln Gottron 05/03/2008 15:52:28  _____________________________________________________________________  External Attachment:    Type:   Image     Comment:   External Document

## 2011-01-17 NOTE — Assessment & Plan Note (Signed)
Medications Added ADVAIR DISKUS 500-50 MCG/DOSE MISC (FLUTICASONE-SALMETEROL)  CIALIS 20 MG TABS (TADALAFIL)  FLUTICASONE PROPIONATE 50 MCG/ACT SUSP (FLUTICASONE PROPIONATE)  NEXIUM 40 MG CPDR (ESOMEPRAZOLE MAGNESIUM)  PRAVASTATIN SODIUM 40 MG TABS (PRAVASTATIN SODIUM) Take 1 tablet by mouth once a day PROVENTIL HFA 108 (90 BASE) MCG/ACT AERS (ALBUTEROL SULFATE) Inhale 2 puff as directed four times a day SINGULAIR 10 MG TABS (MONTELUKAST SODIUM)  DICLOFENAC SODIUM 75 MG  TBEC (DICLOFENAC SODIUM) 1 by mouth two times a day prn      Allergies Added: NKDA  Vital Signs:  Patient Profile:   60 Years Old Male Height:     67 inches Weight:      226 pounds Temp:     98.5 degrees F Pulse rate:   82 / minute BP sitting:   130 / 81  Vitals Entered By: [name] MD (October 08, 2007 4:32 PM)                 History of Present Illness: here with 1 wk gradually increasing medial right knee pain without swelling, twisting or trauma or fall, no fever  Current Allergies (reviewed today): No known allergies  Updated/Current Medications (including changes made in today's visit):  ADVAIR DISKUS 500-50 MCG/DOSE MISC (FLUTICASONE-SALMETEROL)  CIALIS 20 MG TABS (TADALAFIL)  FLUTICASONE PROPIONATE 50 MCG/ACT SUSP (FLUTICASONE PROPIONATE)  NEXIUM 40 MG CPDR (ESOMEPRAZOLE MAGNESIUM)  PRAVASTATIN SODIUM 40 MG TABS (PRAVASTATIN SODIUM) Take 1 tablet by mouth once a day PROVENTIL HFA 108 (90 BASE) MCG/ACT AERS (ALBUTEROL SULFATE) Inhale 2 puff as directed four times a day SINGULAIR 10 MG TABS (MONTELUKAST SODIUM)  DICLOFENAC SODIUM 75 MG  TBEC (DICLOFENAC SODIUM) 1 by mouth two times a day prn   Past Medical History:    Reviewed history from 10/03/2007 and no changes required:       Asthma       GERD       Allergic rhinitis       Obesity       Erectile Dysfunction       Hypertension       Hyperlipidemia       Colonic polyps, hx of       Hx of Alcohol Dependence        Anemia-NOS  Past Surgical History:    Reviewed history from 10/03/2007 and no changes required:       Vasectomy       Bilateral Hydrocele Repair   Family History:    Reviewed history and no changes required:       Family History of CAD Male 1st degree relative <60  Social History:    Reviewed history and no changes required:       Never Smoked       Alcohol use-no   Risk Factors:  Tobacco use:  never Alcohol use:  no    Physical Exam  General:     Well-developed,well-nourished,in no acute distress; alert,appropriate and cooperative throughout examination Neck:     No deformities, masses, or tenderness noted. Lungs:     Normal respiratory effort, chest expands symmetrically. Lungs are clear to auscultation, no crackles or wheezes. Heart:     Normal rate and regular rhythm. S1 and S2 normal without gallop, murmur, click, rub or other extra sounds. Extremities:     right knee with medial joint line tenderness only, no swelling, has from    Impression & Recommendations:  Problem # 1:  KNEE PAIN, RIGHT (ICD-719.46)  His  updated medication list for this problem includes:    Diclofenac Sodium 75 Mg Tbec (Diclofenac sodium) .Marland Kitchen... 1 by mouth two times a day as needed tx with above, suspect medial OA changes and pain related - refer Ortho per pt request   Complete Medication List: 1)  Advair Diskus 500-50 Mcg/dose Misc (Fluticasone-salmeterol) 2)  Cialis 20 Mg Tabs (Tadalafil) 3)  Fluticasone Propionate 50 Mcg/act Susp (Fluticasone propionate) 4)  Nexium 40 Mg Cpdr (Esomeprazole magnesium) 5)  Pravastatin Sodium 40 Mg Tabs (Pravastatin sodium) .... Take 1 tablet by mouth once a day 6)  Proventil Hfa 108 (90 Base) Mcg/act Aers (Albuterol sulfate) .... Inhale 2 puff as directed four times a day 7)  Singulair 10 Mg Tabs (Montelukast sodium) 8)  Diclofenac Sodium 75 Mg Tbec (Diclofenac sodium) .Marland Kitchen.. 1 by mouth two times a day prn     Prescriptions: DICLOFENAC SODIUM  75 MG  TBEC (DICLOFENAC SODIUM) 1 by mouth two times a day prn  #60 x 11   Entered and Authorized by:   [name] MD   Signed by:   [name] MD on 10/08/2007   Method used:   Electronically sent to ...       [facility] # [phone]*       [address]       [place], [place]  [address]       Ph: [phone] or [phone]       Fax: [phone]   RxID:   [id number]  ]

## 2011-01-17 NOTE — Assessment & Plan Note (Signed)
Summary: CPX / $ 50 /CD   Vital Signs:  Patient Profile:   60 Years Old Male Height:     67 inches Weight:      215.2 pounds O2 Sat:      95 % O2 treatment:    Room Air Temp:     98.7 degrees F oral Pulse rate:   75 / minute BP sitting:   158 / 106  (left arm) Cuff size:   regular  Vitals Entered By: Payton Spark CMA (October 06, 2008 2:11 PM)                 Preventive Care Screening  Last Tetanus Booster:    Date:  03/11/2000    Results:  given   Colonoscopy:    Next Due:  09/2011  Last Flu Shot:    Date:  10/06/2008    Results:  Fluvax Non-MCR    Chief Complaint:  cpx.  History of Present Illness: does not check BP anywhere else; wt down over 10 lbs in one year with diet only, no real excercise as he still has pain to the right knee despite surgury; o/w doing ok, no other complaints    Updated Prior Medication List: ADVAIR DISKUS 500-50 MCG/DOSE MISC (FLUTICASONE-SALMETEROL) 1 puff two times a day CIALIS 20 MG TABS (TADALAFIL) 1 by mouth once daily as needed FLUTICASONE PROPIONATE 50 MCG/ACT SUSP (FLUTICASONE PROPIONATE) 2 spray/side once daily NEXIUM 40 MG CPDR (ESOMEPRAZOLE MAGNESIUM) 1 by mouth once daily PRAVASTATIN SODIUM 40 MG TABS (PRAVASTATIN SODIUM) Take 1 tablet by mouth once a day PROVENTIL HFA 108 (90 BASE) MCG/ACT AERS (ALBUTEROL SULFATE) Inhale 2 puff as directed four times a day SINGULAIR 10 MG TABS (MONTELUKAST SODIUM) 1 by mouth once daily DICLOFENAC SODIUM 75 MG  TBEC (DICLOFENAC SODIUM) 1 by mouth two times a day prn * CYANOCOBALAMIN 1000 MCG Q MO  ADULT ASPIRIN EC LOW STRENGTH 81 MG TBEC (ASPIRIN) 1 by mouth once daily  Current Allergies (reviewed today): No known allergies   Past Medical History:    Reviewed history from 10/08/2007 and no changes required:       Asthma       GERD       Allergic rhinitis       Obesity       Erectile Dysfunction       Hypertension       Hyperlipidemia       Colonic polyps, hx of       Hx of  Alcohol Dependence       Anemia-NOS  Past Surgical History:    Reviewed history from 10/03/2007 and no changes required:       Vasectomy       Bilateral Hydrocele Repair       s/p right knee surgury -    Family History:    Reviewed history from 10/08/2007 and no changes required:       Family History of CAD Male 1st degree relative - MI at 76 yo       sister with SLE        2 cousins with DM       mother with cancer ? breast       ocusin and uncle with ETOH       sister with HTN  Social History:    Reviewed history from 10/08/2007 and no changes required:       Never Smoked       Alcohol use-no  Divorced       2 children       work Games developer - post [employer]    Review of Systems  The patient denies anorexia, fever, weight loss, weight gain, vision loss, decreased hearing, hoarseness, chest pain, syncope, dyspnea on exertion, peripheral edema, prolonged cough, headaches, hemoptysis, abdominal pain, melena, hematochezia, severe indigestion/heartburn, hematuria, incontinence, muscle weakness, suspicious skin lesions, transient blindness, difficulty walking, depression, unusual weight change, abnormal bleeding, enlarged lymph nodes, angioedema, and breast masses.         all otherwise negative    Physical Exam  General:     alert and overweight-appearing.   Head:     Normocephalic and atraumatic without obvious abnormalities. No apparent alopecia or balding. Eyes:     No corneal or conjunctival inflammation noted. EOMI. Perrla. Ears:     External ear exam shows no significant lesions or deformities.  Otoscopic examination reveals clear canals, tympanic membranes are intact bilaterally without bulging, retraction, inflammation or discharge. Hearing is grossly normal bilaterally. Nose:     External nasal examination shows no deformity or inflammation. Nasal mucosa are pink and moist without lesions or exudates. Mouth:     Oral mucosa and oropharynx without  lesions or exudates.  Teeth in good repair. Neck:     No deformities, masses, or tenderness noted. Lungs:     Normal respiratory effort, chest expands symmetrically. Lungs are clear to auscultation, no crackles or wheezes. Heart:     Normal rate and regular rhythm. S1 and S2 normal without gallop, murmur, click, rub or other extra sounds. Abdomen:     Bowel sounds positive,abdomen soft and non-tender without masses, organomegaly or hernias noted. Genitalia:     Testes bilaterally descended without nodularity, tenderness or masses. No scrotal masses or lesions. No penis lesions or urethral discharge. Msk:     no joint tenderness and no joint swelling.   Extremities:     no edema, no ulcers  Neurologic:     cranial nerves II-XII intact and strength normal in all extremities.      Impression & Recommendations:  Problem # 1:  Preventive Health Care (ICD-V70.0) Overall doing well, up to date, counseled on routine health concerns for screening and prevention, immunizations up to date or declined, labs reviewed, ecg reviewed    Orders: EKG w/ Interpretation (93000)   Problem # 2:  HYPERTENSION (ICD-401.9)  BP today: 158/106 Prior BP: 130/81 (10/08/2007)  Labs Reviewed: Creat: 1.3 (09/27/2008) Chol: 174 (09/27/2008)   HDL: 37.0 (09/27/2008)   LDL: DEL (09/27/2008)   TG: 246 (09/27/2008) stable overall by hx and exam, ok to continue meds/tx as is - declines meds today  Problem # 3:  B12 DEFICIENCY (ICD-266.2) for b12 shot today, and f/u monthly for b12 shots  Complete Medication List: 1)  Advair Diskus 500-50 Mcg/dose Misc (Fluticasone-salmeterol) .Marland Kitchen.. 1 puff two times a day 2)  Cialis 20 Mg Tabs (Tadalafil) .Marland Kitchen.. 1 by mouth once daily as needed 3)  Fluticasone Propionate 50 Mcg/act Susp (Fluticasone propionate) .... 2 spray/side once daily 4)  Nexium 40 Mg Cpdr (Esomeprazole magnesium) .Marland Kitchen.. 1 by mouth once daily 5)  Pravastatin Sodium 40 Mg Tabs (Pravastatin sodium) .... Take 1  tablet by mouth once a day 6)  Proventil Hfa 108 (90 Base) Mcg/act Aers (Albuterol sulfate) .... Inhale 2 puff as directed four times a day 7)  Singulair 10 Mg Tabs (Montelukast sodium) .Marland Kitchen.. 1 by mouth once daily 8)  Diclofenac Sodium 75 Mg Tbec (  Diclofenac sodium) .Marland Kitchen.. 1 by mouth two times a day prn 9)  Cyanocobalamin 1000 Mcg Q Mo  10)  Adult Aspirin Ec Low Strength 81 Mg Tbec (Aspirin) .Marland Kitchen.. 1 by mouth once daily  Other Orders: Influenza Vaccine NON MCR (98119) Vit B12 1000 mcg (J3420) Admin of Therapeutic Inj  intramuscular or subcutaneous (14782)   Patient Instructions: 1)  you received the flu shot today 2)  you also received the first B12 shot today 3)  please return every month for the nurse to repeat the B12 shot 4)  your EKG and blood tests were otherwise good today 5)  Continue all medications that you may have been taking previously 6)  Please schedule a follow-up appointment in 1 year or sooner if needed 7)  Take an Aspirin every day - 81mg  - 1 per day- COATED only   Prescriptions: DICLOFENAC SODIUM 75 MG  TBEC (DICLOFENAC SODIUM) 1 by mouth two times a day prn  #60 x 11   Entered and Authorized by:   [name] MD   Signed by:   [name] MD on 10/06/2008   Method used:   Print then Give to Patient   RxID:   [id number] SINGULAIR 10 MG TABS (MONTELUKAST SODIUM) 1 by mouth once daily  #30 x 11   Entered and Authorized by:   [name] MD   Signed by:   [name] MD on 10/06/2008   Method used:   Print then Give to Patient   RxID:   [id number] PROVENTIL HFA 108 (90 BASE) MCG/ACT AERS (ALBUTEROL SULFATE) Inhale 2 puff as directed four times a day  #1 x 11   Entered and Authorized by:   [name] MD   Signed by:   [name] MD on 10/06/2008   Method used:   Print then Give to Patient   RxID:   [id number] FLUTICASONE PROPIONATE 50 MCG/ACT SUSP (FLUTICASONE PROPIONATE) 2 spray/side once daily  #1 x 11   Entered and Authorized by:   [name] MD   Signed by:   [name] MD on 10/06/2008   Method used:   Print then Give to Patient   RxID:   [id number] CIALIS 20 MG TABS (TADALAFIL) 1 by mouth once daily as needed  #5 x 11   Entered and Authorized by:   [name] MD   Signed by:   [name] MD on 10/06/2008   Method used:   Print then Give to Patient   RxID:   [id number] PRAVASTATIN SODIUM 40 MG TABS (PRAVASTATIN SODIUM) Take 1 tablet by mouth once a day  #30 x 11   Entered and Authorized by:   [name] MD   Signed by:   [name] MD on 10/06/2008   Method used:   Print then Give to Patient   RxID:   [id number] NEXIUM 40 MG CPDR (ESOMEPRAZOLE MAGNESIUM) 1 by mouth once daily  #30 x 11   Entered and Authorized by:   [name] MD   Signed by:   [name] MD on 10/06/2008   Method used:   Print then Give to Patient   RxID:   [id number] ADVAIR DISKUS 500-50 MCG/DOSE MISC (FLUTICASONE-SALMETEROL) 1 puff two times a day  #1 x 11   Entered and Authorized by:   [name] MD   Signed by:   [name] MD on 10/06/2008  Method used:   Print then Give to Patient   RxID:   [id number]  ]  Influenza Vaccine    Vaccine Type: Fluvax Non-MCR    Site: left deltoid    Mfr: GlaxoSmithKline    Dose: 0.5 ml    Route: IM    Given by: Payton Spark CMA    Exp. Date: 06/14/2009    Lot #: [id number]    VIS given: 07/09/07 version given October 06, 2008.  Flu Vaccine Consent Questions    Do you have a history of severe allergic reactions to this vaccine? no    Any prior history of allergic reactions to egg and/or gelatin? no    Do you have a sensitivity to the preservative Thimersol? no    Do you have a past history of Guillan-Barre Syndrome? no    Do you currently have an acute febrile illness? no    Have you ever had a severe reaction to latex? no    Vaccine information given and explained to patient? yes     Medication Administration  Injection # 1:    Medication:  Vit B12 1000 mcg    Diagnosis: ANEMIA-NOS (ICD-285.9)    Route: IM    Site: L deltoid    Exp Date: 06/14/2010    Lot #: [id number]    Mfr: [employer]    Patient tolerated injection without complications    Given by: Payton Spark CMA (October 06, 2008 3:32 PM)  Orders Added: 1)  Influenza Vaccine NON MCR [00028] 2)  EKG w/ Interpretation [93000] 3)  Vit B12 1000 mcg [J3420] 4)  Admin of Therapeutic Inj  intramuscular or subcutaneous [96372] 5)  Est. Patient 40-64 years [09323]

## 2011-01-17 NOTE — Progress Notes (Signed)
Summary: rx refill  Phone Note Call from Patient   Caller: Patient [phone] Summary of Call: pt called requesting refill be sent to [facility] on [address]. returned pt called to get specific Rx's to be refilled. left message on machine for pt to return my call  Initial call taken by: Margaret Pyle, CMA,  December 25, 2009 10:17 AM  Follow-up for Phone Call        pt informed Follow-up by: Margaret Pyle, CMA,  December 25, 2009 12:07 PM    Prescriptions: OMEPRAZOLE 20 MG CPDR (OMEPRAZOLE) 1 by mouth 2 times a day  #60 x 11   Entered by:   Margaret Pyle, CMA   Authorized by:   [name] MD   Signed by:   Margaret Pyle, CMA on 12/25/2009   Method used:   Electronically to        [facility] [phone]* (retail)       [address]       [address]       [place], [place]  [address]       Ph: [phone]       Fax: [phone]   RxID:   [id number] SINGULAIR 10 MG TABS (MONTELUKAST SODIUM) 1 by mouth once daily  #30 x 11   Entered by:   Margaret Pyle, CMA   Authorized by:   [name] MD   Signed by:   Margaret Pyle, CMA on 12/25/2009   Method used:   Electronically to        [facility] [phone]* (retail)       [address]       [address]       [place], [place]  [address]       Ph: [phone]       Fax: [phone]   RxID:   [id number] CIALIS 20 MG TABS (TADALAFIL) 1 by mouth once daily as needed  #5 x 11   Entered by:   Margaret Pyle, CMA   Authorized by:   [name] MD   Signed by:   Margaret Pyle, CMA on 12/25/2009   Method used:   Electronically to        [facility] [phone]* (retail)       [address]       [address]       [place], [place]  [address]       Ph: [phone]       Fax: [phone]   RxID:   [id number] ADVAIR DISKUS 500-50 MCG/DOSE MISC (FLUTICASONE-SALMETEROL) 1 puff two times a day  #1 mth x 11   Entered by:   Margaret Pyle, CMA   Authorized by:   [name] MD   Signed by:   Margaret Pyle, CMA on 12/25/2009   Method used:   Electronically to        [facility] [phone]* (retail)       [address]       [address]       [place], [place]  [address]       Ph: [phone]       Fax: [phone]   RxID:   [id number]

## 2011-01-17 NOTE — Letter (Signed)
Summary: Patient Notice-Hyperplastic Polyps  [facility]  [address] [place], [place] [address]   Phone: [phone]  Fax: [phone]        January 04, 2010 MRN: [id number]    [facility] [address] [place], [place]  [address]    Dear Darrell Hoffman,  I am pleased to inform you that the colon polyp(s) removed during your recent colonoscopy was (were) found to be hyperplastic. These types of polyps are NOT pre-cancerous.  The duodenal biopsies showed mild duodenitis.  It is my recommendation that you have a repeat colonoscopy examination in 3 years for routine colorectal cancer screening.  Should you develop new or worsening symptoms of abdominal pain, bowel habit changes or bleeding from the rectum or bowels, please schedule an evaluation with either your primary care physician or with me.  Continue treatment plan as outlined the day of your exam.  Please call us if you are having persistent problems or have questions about your condition that have not been fully answered at this time.  Sincerely,  Darrell Dare MD [facility]  This letter has been electronically signed by your physician.  Appended Document: Patient Notice-Hyperplastic Polyps Letter mailed 1.21.11

## 2011-01-17 NOTE — Procedures (Signed)
Summary: Colonoscopy  Patient: Darrell Hoffman Note: All result statuses are Final unless otherwise noted.  Tests: (1) Colonoscopy (COL)   COL Colonoscopy           DONE     [facility]     [address]     [place], [place]  [address]           COLONOSCOPY PROCEDURE REPORT           PATIENT:  Darrell Hoffman, Darrell Hoffman  MR#:  [id number]     BIRTHDATE:  [birth year], 58 yrs. old  GENDER:  male           ENDOSCOPIST:  Judie Petit T. 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           MEDICATIONS:   Fentanyl 50 mcg IV, Versed 6 mg IV           DESCRIPTION OF PROCEDURE:   After the risks benefits and     alternatives of the procedure were thoroughly explained, informed     consent was obtained.  Digital rectal exam was performed and     revealed no abnormalities.   The LB [id number] endoscope     was introduced through the anus and advanced to the cecum, which     was identified by both the appendix and ileocecal valve, without     limitations.  The quality of the prep was excellent, using     MoviPrep.  The instrument was then slowly withdrawn as the colon     was fully examined.     <<PROCEDUREIMAGES>>           FINDINGS:  Two polyps were found in the ascending colon. They were     5 mm in size. Polyps were snared without cautery. Retrieval was     successful for one of the polyps and the other was not retrieved.     Three polyps were found in the sigmoid colon. They were 3 - 4 mm in     size. Polyps were snared without cautery. Retrieval was     successful.  Mild diverticulosis was found in the transverse     colon.  This was otherwise a normal examination of the colon.     Retroflexed views in the rectum revealed no abnormalities.  The     time to cecum =  1.5  minutes. The scope was then  withdrawn (time     =  11.25  min) from the patient and the procedure completed.           COMPLICATIONS:  None           ENDOSCOPIC IMPRESSION:     1) 5 mm Two polyps in the ascending colon     2) 3 - 4 mm Three polyps in the sigmoid colon     3) Mild diverticulosis in the transverse colon           RECOMMENDATIONS:     1) Await pathology results     2) Repeat Colonoscopy in 3 years.     3) High fiber diet     4) Upper endoscopy today  Venita Lick. Russella Dar, MD, Clementeen Graham           CC: [name], MD           n.     Rosalie DoctorVenita Lick. [facility] at 01/03/2010 11:11 AM           Drue Second, [id number]  Note: An exclamation mark (!) indicates a result that was not dispersed into the flowsheet. Document Creation Date: 01/03/2010 11:11 AM _______________________________________________________________________  (1) Order result status: Final Collection or observation date-time: 01/03/2010 11:07 Requested date-time:  Receipt date-time:  Reported date-time:  Referring Physician:   Ordering Physician: Claudette Head [phone]) Specimen Source:  Source: Launa Grill Order Number: [id number] Lab site:   Appended Document: Colonoscopy     Procedures Next Due Date:    Colonoscopy: 12/2012

## 2011-01-17 NOTE — Progress Notes (Signed)
Summary: PA Lansoprazole  Phone Note From Pharmacy   Caller: Johny Sax [phone] Summary of Call: PA is required for Lansoprazole. Pt prescription plan says omeprazole and prilosec are covered drugs. please advise Initial call taken by: Margaret Pyle, CMA,  September 26, 2009 11:14 AM  Follow-up for Phone Call        pt not seen since 12/09 - please call pt - ? ok to try switch to omeprazole 20 mg - 2 per day? Follow-up by: [name] MD,  September 26, 2009 11:18 AM  Additional Follow-up for Phone Call Additional follow up Details #1::        left message on machine for pt to return my call  Additional Follow-up by: Margaret Pyle, CMA,  September 26, 2009 11:26 AM    Additional Follow-up for Phone Call Additional follow up Details #2::    pt ok'd for omemprazole, rx sent to pt pharmacy Follow-up by: Margaret Pyle, CMA,  September 26, 2009 2:32 PM  New/Updated Medications: OMEPRAZOLE 20 MG CPDR (OMEPRAZOLE) 1 by mouth 2 times a day Prescriptions: OMEPRAZOLE 20 MG CPDR (OMEPRAZOLE) 1 by mouth 2 times a day  #60 x 5   Entered by:   Margaret Pyle, CMA   Authorized by:   [name] MD   Signed by:   Margaret Pyle, CMA on 09/26/2009   Method used:   Electronically to        [facility] [phone]* (retail)       [address]       [address]       [place], [place]  [address]       Ph: [phone]       Fax: [phone]   RxID:   [id number]

## 2011-01-17 NOTE — Letter (Signed)
Summary: [facility] Instructions  [facility]  [address] [place], [place] [address]   Phone: [phone]  Fax: [phone]       Darrell Hoffman    20-Apr-1960    MRN: [id number]        Procedure Day Dorna Bloom: Wednesday January 19th, 2011     Arrival Time: 9:30am     Procedure Time: 10:30am     Location of Procedure:                    _ x_   [facility] Center (4th Floor)                        PREPARATION FOR COLONOSCOPY WITH MOVIPREP   Starting 5 days prior to your procedure  12/27/09 do not eat nuts, seeds, popcorn, corn, beans, peas,  salads, or any raw vegetables.  Do not take any fiber supplements (e.g. Metamucil, Citrucel, and Benefiber).  THE DAY BEFORE YOUR PROCEDURE         DATE:  Tuesday   DAY:  01/02/10   1.  Drink clear liquids the entire day-NO SOLID FOOD  2.  Do not drink anything colored red or purple.  Avoid juices with pulp.  No orange juice.  3.  Drink at least 64 oz. (8 glasses) of fluid/clear liquids during the day to prevent dehydration and help the prep work efficiently.  CLEAR LIQUIDS INCLUDE: Water Jello Ice Popsicles Tea (sugar ok, no milk/cream) Powdered fruit flavored drinks Coffee (sugar ok, no milk/cream) Gatorade Juice: apple, white grape, white cranberry  Lemonade Clear bullion, consomm, broth Carbonated beverages (any kind) Strained chicken noodle soup Hard Candy                             4.  In the morning, mix first dose of MoviPrep solution:    Empty 1 Pouch A and 1 Pouch B into the disposable container    Add lukewarm drinking water to the top line of the container. Mix to dissolve    Refrigerate (mixed solution should be used within 24 hrs)  5.  Begin drinking the prep at 5:00 p.m. The MoviPrep container is divided by 4 marks.   Every 15 minutes drink the solution down to the next mark (approximately 8 oz) until the full liter is complete.   6.  Follow completed prep with 16 oz of clear liquid of your  choice (Nothing red or purple).  Continue to drink clear liquids until bedtime.  7.  Before going to bed, mix second dose of MoviPrep solution:    Empty 1 Pouch A and 1 Pouch B into the disposable container    Add lukewarm drinking water to the top line of the container. Mix to dissolve    Refrigerate  THE DAY OF YOUR PROCEDURE      DATE: Wednesday   DAY: 01/03/10 _  Beginning at  5:30 a.m. (5 hours before procedure):         1. Every 15 minutes, drink the solution down to the next mark (approx 8 oz) until the full liter is complete.  2. Follow completed prep with 16 oz. of clear liquid of your choice.    3. You may drink clear liquids until  8:30am  (2 HOURS BEFORE PROCEDURE).   MEDICATION INSTRUCTIONS  Unless otherwise instructed, you should take regular prescription medications with a small sip of  water   as early as possible the morning of your procedure.    Additional medication instructions:  Stop iron 7 days before procedure.         OTHER INSTRUCTIONS  You will need a responsible adult at least 59 years of age to accompany you and drive you home.   This person must remain in the waiting room during your procedure.  Wear loose fitting clothing that is easily removed.  Leave jewelry and other valuables at home.  However, you may wish to bring a book to read or  an iPod/MP3 player to listen to music as you wait for your procedure to start.  Remove all body piercing jewelry and leave at home.  Total time from sign-in until discharge is approximately 2-3 hours.  You should go home directly after your procedure and rest.  You can resume normal activities the  day after your procedure.  The day of your procedure you should not:   Drive   Make legal decisions   Operate machinery   Drink alcohol   Return to work  You will receive specific instructions about eating, activities and medications before you leave.    The above instructions have been  reviewed and explained to me by   Marchelle Folks.     I fully understand and can verbalize these instructions _____________________________ Date _________

## 2011-01-17 NOTE — Progress Notes (Signed)
Summary: [facility]   Imported By: Esmeralda Links D'jimraou 10/22/2007 16:14:56  _____________________________________________________________________  External Attachment:    Type:   Image     Comment:   External Document

## 2011-01-17 NOTE — Progress Notes (Signed)
Summary: PA Nexium  Phone Note From Pharmacy   Caller: [facility] [address] Summary of Call: PA required for Nexium, Can rx be cahnge to generic Initial call taken by: Margaret Pyle, CMA,  September 25, 2009 10:57 AM  Follow-up for Phone Call        call pt  - Nexium no longer covered by insurance;  can we change to generic prevacid? Follow-up by: [name] MD,  September 25, 2009 12:31 PM  Additional Follow-up for Phone Call Additional follow up Details #1::        left message on machine for pt to return my call  Additional Follow-up by: Margaret Pyle, CMA,  September 25, 2009 1:28 PM    Additional Follow-up for Phone Call Additional follow up Details #2::    pt is willing to change rx, same pharmacy. Follow-up by: Margaret Pyle, CMA,  September 25, 2009 1:39 PM  Additional Follow-up for Phone Call Additional follow up Details #3:: Details for Additional Follow-up Action Taken: done escript sent to wrong pharm, rx re-sent, Ferdie Ping Additional Follow-up by: [name] MD,  September 25, 2009 1:51 PM  New/Updated Medications: LANSOPRAZOLE 30 MG CPDR (LANSOPRAZOLE) 1po once daily Prescriptions: LANSOPRAZOLE 30 MG CPDR (LANSOPRAZOLE) 1po once daily  #30 x 11   Entered by:   Margaret Pyle, CMA   Authorized by:   [name] MD   Signed by:   Margaret Pyle, CMA on 09/25/2009   Method used:   Electronically to        [facility] [phone]* (retail)       [address]       [address]       [place], [place]  [address]       Ph: [phone]       Fax: [phone]   RxID:   [id number] LANSOPRAZOLE 30 MG CPDR (LANSOPRAZOLE) 1po once daily  #30 x 11   Entered and Authorized by:   [name] MD   Signed by:   [name] MD on 09/25/2009   Method used:   Electronically to        [facility] [address] [address] [phone]* (retail)       [address]       [place], [place]  [address]       Ph: [phone] or  [phone]       Fax: [phone]   RxID:   [id number]

## 2011-01-17 NOTE — Assessment & Plan Note (Signed)
Summary: iron def anemia...em   History of Present Illness Visit Type: Initial Consult Primary GI MD: Elie Goody MD [facility] Primary Natalee Tomkiewicz: Oliver Barre, MD Requesting Kholton Coate: Oliver Barre, MD Chief Complaint: Patient here for further evaluation of iron deficiency anemia. He states that he has reflux which is controlled on PPI's. He denies any other GI symptoms. No fatigue or shortness of breath. History of Present Illness:   This is a 60 year old male recently found to have an iron and B12 deficiency anemia. He has a prior history of a tubulovillous adenoma removed in 2004. He has referred is well controlled on omeprazole 40 mg daily. Has not previously had upper endoscopy. His last colonoscopy was in September 2007 showed diverticulosis and a small colon polyp.   GI Review of Systems    Reports acid reflux and  heartburn.      Denies abdominal pain, belching, bloating, chest pain, dysphagia with liquids, dysphagia with solids, loss of appetite, nausea, vomiting, vomiting blood, weight loss, and  weight gain.        Denies anal fissure, black tarry stools, change in bowel habit, constipation, diarrhea, diverticulosis, fecal incontinence, heme positive stool, hemorrhoids, irritable bowel syndrome, jaundice, light color stool, liver problems, rectal bleeding, and  rectal pain. Preventive Screening-Counseling & Management  Alcohol-Tobacco     Smoking Status: never  Caffeine-Diet-Exercise     Does Patient Exercise: no      Drug Use:  yes.     Current Medications (verified): 1)  Advair Diskus 500-50 Mcg/dose Misc (Fluticasone-Salmeterol) .Marland Kitchen.. 1 Puff Two Times A Day 2)  Cialis 20 Mg Tabs (Tadalafil) .Marland Kitchen.. 1 By Mouth Once Daily As Needed 3)  Singulair 10 Mg Tabs (Montelukast Sodium) .Marland Kitchen.. 1 By Mouth Once Daily 4)  Cyanocobalamin 1000 Mcg/ml .... Inject 1 Ml Im Once Per Month 5)  Omeprazole 20 Mg Cpdr (Omeprazole) .Marland Kitchen.. 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  Alcohol Use - yes-rare Illicit Drug Use - yes-marijuana-daily Patient does not get regular exercise.  Drug Use:  yes Does Patient Exercise:  no  Review of Systems       The patient complains of anemia, arthritis/joint pain, and muscle pains/cramps.         The pertinent positives and negatives are noted as above and in the HPI. All other ROS were reviewed and were negative.   Vital Signs:  Patient profile:   60 year old male Height:      65 inches Weight:      222 pounds BMI:     37.08 BSA:     2.07 Pulse rate:   72 / minute Pulse rhythm:   regular BP sitting:   140 / 90  (right arm)  Vitals Entered By: Hortense Ramal CMA Duncan Dull) (December 04, 2009 10:34 AM)  Physical Exam  General:  Well developed, well nourished, no acute distress. obese.   Head:  Normocephalic and atraumatic. Eyes:  PERRLA, no icterus. Ears:  Normal auditory acuity. Mouth:  No deformity or lesions, dentition normal. Neck:  Supple; no masses or thyromegaly. Chest Wall:  Symmetrical,  no deformities . Lungs:  Clear throughout to auscultation. Heart:  Regular rate and rhythm; no murmurs, rubs,  or bruits. Abdomen:  Soft, nontender and nondistended.  No masses, hepatosplenomegaly or hernias noted. Normal bowel sounds. Rectal:  deferred until time of colonoscopy.   Msk:  Symmetrical with no gross deformities. Normal posture. Pulses:  Normal pulses noted. Extremities:  No clubbing, cyanosis, edema or deformities noted. Neurologic:  Alert and  oriented x4;  grossly normal neurologically. Cervical Nodes:  No significant cervical adenopathy. Inguinal Nodes:  No significant inguinal adenopathy. Psych:  Alert and cooperative. Normal mood and affect.  Impression & Recommendations:  Problem # 1:  ANEMIA-IRON DEFICIENCY (ICD-280.9) Iron deficiency anemia. Rule out occult gastrointestinal blood loss or poor iron absorption. Rule out celiac disease. The risks, benefits and alternatives to colonoscopy with possible biopsy and possible polypectomy were discussed with the patient and they consent to proceed. The procedure will be scheduled electively. The risks, benefits and alternatives to endoscopy with possible biopsy and possible dilation were discussed with the patient and they consent to proceed. The procedure will be scheduled electively. Orders: Colon/Endo (Colon/Endo) TLB-IgA (Immunoglobulin A) (82784-IGA) T-Sprue Panel (Celiac Disease Aby Eval) (83516x3/[address])  Problem # 2:  COLONIC POLYPS, HX OF (ICD-V12.72) Personal history of a tubulovillous adenoma in 2004. Colonoscopy as above.  Problem # 3:  GERD (ICD-530.81) Chronic GERD. Continue omeprazole 40 mg daily, and standard antireflux measures. Screen for Barrett's as in problem #1.  Problem # 4:  ANEMIA-B12 DEFICIENCY (ICD-281.1) Replacement as planned by Dr. Jonny Ruiz.  Patient Instructions: 1)  Get your labs drawn today in the basement.  2)  Colonoscopy brochure given.  3)  Conscious Sedation brochure given.  4)  Upper Endoscopy brochure given.  5)  Copy sent to : Oliver Barre, MD 6)  The medication list was reviewed and reconciled.  All changed / newly prescribed medications were  explained.  A complete medication list was provided to the patient / caregiver.  Prescriptions: MOVIPREP 100 GM  SOLR (PEG-KCL-NACL-NASULF-NA ASC-C) As per prep instructions.  #1 x 0   Entered by:   Christie Nottingham CMA (AAMA)   Authorized by:   Meryl Dare MD [facility]   Signed by:   Christie Nottingham CMA (AAMA) on 12/04/2009   Method used:   Electronically to        [facility] [address] [phone]* (retail)       [address]       [address]       [place], [place]  [address]       Ph: [phone]       Fax: [phone]   RxID:   [id number]

## 2011-01-17 NOTE — Consult Note (Signed)
Summary: [facility]   Imported By: Lanelle Bal 02/11/2008 15:09:39  _____________________________________________________________________  External Attachment:    Type:   Image     Comment:   External Document

## 2011-01-17 NOTE — Consult Note (Signed)
Summary: [facility]   Imported By: Maryln Gottron 03/08/2008 14:00:38  _____________________________________________________________________  External Attachment:    Type:   Image     Comment:   External Document

## 2011-02-07 ENCOUNTER — Telehealth: Payer: Self-pay | Admitting: Internal Medicine

## 2011-02-12 NOTE — Progress Notes (Signed)
Summary: Request for Refills  Phone Note Call from Patient   Caller: Patient Reason for Call: Refill Medication Summary of Call: Pt called to request Rx refills that are going to be needed before his OV appt. on 04/02/11.Marland KitchenPt did not state in msge which Rxs are needed.Juliann Pulse number left [phone] Initial call taken by: Burnard Leigh [facility]),  February 07, 2011 11:31 AM    Prescriptions: PROVENTIL HFA 108 (90 BASE) MCG/ACT AERS (ALBUTEROL SULFATE) inhale 2 puffs as directed four times a day  #1 x 1   Entered by:   Margaret Pyle, CMA   Authorized by:   [name] MD   Signed by:   Margaret Pyle, CMA on 02/07/2011   Method used:   Electronically to        [facility] [phone]* (retail)       [address]       [address]       [place], [place]  [address]       Ph: [phone]       Fax: [phone]   RxID:   [id number] OMEPRAZOLE 20 MG CPDR (OMEPRAZOLE) 1 by mouth 2 times a day  #60 x 1   Entered by:   Margaret Pyle, CMA   Authorized by:   [name] MD   Signed by:   Margaret Pyle, CMA on 02/07/2011   Method used:   Electronically to        [facility] [address] [phone]* (retail)       [address]       [address]       [place], [place]  [address]       Ph: [phone]       Fax: [phone]   RxID:   [id number] SINGULAIR 10 MG TABS (MONTELUKAST SODIUM) 1 by mouth once daily  #30 x 1   Entered by:   Margaret Pyle, CMA   Authorized by:   [name] MD   Signed by:   Margaret Pyle, CMA on 02/07/2011   Method used:   Electronically to        [facility] [address] [phone]* (retail)       [address]       [address]       [place], [place]  [address]       Ph: [phone]       Fax: [phone]   RxID:   [id number] CIALIS 20 MG TABS (TADALAFIL) 1 by mouth once daily as needed  #5 x 1   Entered by:   Margaret Pyle, CMA   Authorized by:   [name] MD   Signed  by:   Margaret Pyle, CMA on 02/07/2011   Method used:   Electronically to        [facility] [address] [phone]* (retail)       [address]       [address]       [place], [place]  [address]       Ph: [phone]       Fax: [phone]   RxID:   [id number] ADVAIR DISKUS 500-50 MCG/DOSE MISC (FLUTICASONE-SALMETEROL) 1 puff two times a day  #1 x 1   Entered by:   Margaret Pyle, CMA   Authorized by:   [name] MD   Signed by:   Margaret Pyle, CMA on 02/07/2011   Method used:   Electronically to  [facility] [address] [phone]* (retail)       [address]       [address]       [place], [place]  [address]       Ph: [phone]       Fax: [phone]   RxID:   [id number]

## 2011-03-15 ENCOUNTER — Other Ambulatory Visit (INDEPENDENT_AMBULATORY_CARE_PROVIDER_SITE_OTHER): Payer: Federal, State, Local not specified - PPO

## 2011-03-15 DIAGNOSIS — Z0389 Encounter for observation for other suspected diseases and conditions ruled out: Secondary | ICD-10-CM

## 2011-03-15 DIAGNOSIS — Z0000 Encounter for general adult medical examination without abnormal findings: Secondary | ICD-10-CM

## 2011-03-15 LAB — HEPATIC FUNCTION PANEL
ALT: 15 U/L (ref 0–53)
AST: 22 U/L (ref 0–37)
Albumin: 3.9 g/dL (ref 3.5–5.2)
Alkaline Phosphatase: 52 U/L (ref 39–117)
Bilirubin, Direct: 0.1 mg/dL (ref 0.0–0.3)
Total Bilirubin: 0.6 mg/dL (ref 0.3–1.2)
Total Protein: 7.1 g/dL (ref 6.0–8.3)

## 2011-03-15 LAB — CBC WITH DIFFERENTIAL/PLATELET
Basophils Absolute: 0 10*3/uL (ref 0.0–0.1)
Basophils Relative: 0.2 % (ref 0.0–3.0)
Eosinophils Absolute: 0.1 10*3/uL (ref 0.0–0.7)
Eosinophils Relative: 1.9 % (ref 0.0–5.0)
HCT: 33.5 % — ABNORMAL LOW (ref 39.0–52.0)
Hemoglobin: 10.9 g/dL — ABNORMAL LOW (ref 13.0–17.0)
Lymphocytes Relative: 41.3 % (ref 12.0–46.0)
Lymphs Abs: 2.5 10*3/uL (ref 0.7–4.0)
MCHC: 32.6 g/dL (ref 30.0–36.0)
MCV: 79.3 fl (ref 78.0–100.0)
Monocytes Absolute: 0.3 10*3/uL (ref 0.1–1.0)
Monocytes Relative: 5.1 % (ref 3.0–12.0)
Neutro Abs: 3.1 10*3/uL (ref 1.4–7.7)
Neutrophils Relative %: 51.5 % (ref 43.0–77.0)
Platelets: 231 10*3/uL (ref 150.0–400.0)
RBC: 4.23 Mil/uL (ref 4.22–5.81)
RDW: 15.7 % — ABNORMAL HIGH (ref 11.5–14.6)
WBC: 6 10*3/uL (ref 4.5–10.5)

## 2011-03-15 LAB — URINALYSIS
Bilirubin Urine: NEGATIVE
Hgb urine dipstick: NEGATIVE
Ketones, ur: NEGATIVE
Leukocytes, UA: NEGATIVE
Nitrite: NEGATIVE
Specific Gravity, Urine: >=1.03 (ref 1.000–1.030)
Total Protein, Urine: NEGATIVE
Urine Glucose: NEGATIVE
Urobilinogen, UA: 0.2 (ref 0.0–1.0)
pH: 5.5 (ref 5.0–8.0)

## 2011-03-15 LAB — BASIC METABOLIC PANEL
BUN: 10 mg/dL (ref 6–23)
CO2: 28 mEq/L (ref 19–32)
Calcium: 9.2 mg/dL (ref 8.4–10.5)
Chloride: 108 mEq/L (ref 96–112)
Creatinine, Ser: 1.6 mg/dL — ABNORMAL HIGH (ref 0.4–1.5)
GFR: 59.39 mL/min — ABNORMAL LOW (ref >60.00)
Glucose, Bld: 92 mg/dL (ref 70–99)
Potassium: 4.5 mEq/L (ref 3.5–5.1)
Sodium: 142 mEq/L (ref 135–145)

## 2011-03-15 LAB — LIPID PANEL
Cholesterol: 178 mg/dL (ref 0–200)
HDL: 41.8 mg/dL (ref >39.00)
LDL Cholesterol: 112 mg/dL — ABNORMAL HIGH (ref 0–99)
Total CHOL/HDL Ratio: 4
Triglycerides: 119 mg/dL (ref 0.0–149.0)
VLDL: 23.8 mg/dL (ref 0.0–40.0)

## 2011-03-15 LAB — PSA: PSA: 0.61 ng/mL (ref 0.10–4.00)

## 2011-03-15 LAB — TSH: TSH: 1.88 u[IU]/mL (ref 0.35–5.50)

## 2011-03-25 ENCOUNTER — Other Ambulatory Visit: Payer: Self-pay | Admitting: Internal Medicine

## 2011-03-25 DIAGNOSIS — Z0389 Encounter for observation for other suspected diseases and conditions ruled out: Secondary | ICD-10-CM

## 2011-03-25 DIAGNOSIS — Z0000 Encounter for general adult medical examination without abnormal findings: Secondary | ICD-10-CM

## 2011-03-26 ENCOUNTER — Other Ambulatory Visit: Payer: Self-pay

## 2011-03-26 LAB — BASIC METABOLIC PANEL
BUN: 8 mg/dL (ref 6–23)
BUN: 8 mg/dL (ref 6–23)
CO2: 26 mEq/L (ref 19–32)
CO2: 30 mEq/L (ref 19–32)
Calcium: 8.2 mg/dL — ABNORMAL LOW (ref 8.4–10.5)
Calcium: 8.4 mg/dL (ref 8.4–10.5)
Chloride: 100 mEq/L (ref 96–112)
Chloride: 101 mEq/L (ref 96–112)
Creatinine, Ser: 1.15 mg/dL (ref 0.4–1.5)
Creatinine, Ser: 1.35 mg/dL (ref 0.4–1.5)
GFR calc Af Amer: >60 mL/min (ref >60)
GFR calc Af Amer: >60 mL/min (ref >60)
GFR calc non Af Amer: 55 mL/min — ABNORMAL LOW (ref >60)
GFR calc non Af Amer: >60 mL/min (ref >60)
Glucose, Bld: 128 mg/dL — ABNORMAL HIGH (ref 70–99)
Glucose, Bld: 145 mg/dL — ABNORMAL HIGH (ref 70–99)
Potassium: 4.1 mEq/L (ref 3.5–5.1)
Potassium: 4.3 mEq/L (ref 3.5–5.1)
Sodium: 134 mEq/L — ABNORMAL LOW (ref 135–145)
Sodium: 137 mEq/L (ref 135–145)

## 2011-03-26 LAB — PROTIME-INR
INR: 1.3 (ref 0.00–1.49)
INR: 2.1 — ABNORMAL HIGH (ref 0.00–1.49)
INR: 2.3 — ABNORMAL HIGH (ref 0.00–1.49)
INR: 2.4 — ABNORMAL HIGH (ref 0.00–1.49)
INR: 2.5 — ABNORMAL HIGH (ref 0.00–1.49)
Prothrombin Time: 16.2 seconds — ABNORMAL HIGH (ref 11.6–15.2)
Prothrombin Time: 25.3 seconds — ABNORMAL HIGH (ref 11.6–15.2)
Prothrombin Time: 26.4 seconds — ABNORMAL HIGH (ref 11.6–15.2)
Prothrombin Time: 27.7 seconds — ABNORMAL HIGH (ref 11.6–15.2)
Prothrombin Time: 28.3 seconds — ABNORMAL HIGH (ref 11.6–15.2)

## 2011-03-26 LAB — CBC
HCT: 23 % — ABNORMAL LOW (ref 39.0–52.0)
HCT: 23.3 % — ABNORMAL LOW (ref 39.0–52.0)
HCT: 24.6 % — ABNORMAL LOW (ref 39.0–52.0)
Hemoglobin: 7.7 g/dL — CL (ref 13.0–17.0)
Hemoglobin: 7.9 g/dL — CL (ref 13.0–17.0)
Hemoglobin: 8.5 g/dL — ABNORMAL LOW (ref 13.0–17.0)
MCHC: 33.6 g/dL (ref 30.0–36.0)
MCHC: 34 g/dL (ref 30.0–36.0)
MCHC: 34.4 g/dL (ref 30.0–36.0)
MCV: 80 fL (ref 78.0–100.0)
MCV: 81 fL (ref 78.0–100.0)
MCV: 81.2 fL (ref 78.0–100.0)
Platelets: 165 10*3/uL (ref 150–400)
Platelets: 176 10*3/uL (ref 150–400)
Platelets: 189 10*3/uL (ref 150–400)
RBC: 2.84 MIL/uL — ABNORMAL LOW (ref 4.22–5.81)
RBC: 2.87 MIL/uL — ABNORMAL LOW (ref 4.22–5.81)
RBC: 3.08 MIL/uL — ABNORMAL LOW (ref 4.22–5.81)
RDW: 14.4 % (ref 11.5–15.5)
RDW: 14.6 % (ref 11.5–15.5)
RDW: 14.8 % (ref 11.5–15.5)
WBC: 8.6 10*3/uL (ref 4.0–10.5)
WBC: 8.9 10*3/uL (ref 4.0–10.5)
WBC: 9.3 10*3/uL (ref 4.0–10.5)

## 2011-03-27 LAB — URINE CULTURE
Colony Count: NO GROWTH
Culture: NO GROWTH

## 2011-03-27 LAB — BASIC METABOLIC PANEL
BUN: 8 mg/dL (ref 6–23)
CO2: 28 mEq/L (ref 19–32)
Calcium: 9.5 mg/dL (ref 8.4–10.5)
Chloride: 106 mEq/L (ref 96–112)
Creatinine, Ser: 1.38 mg/dL (ref 0.4–1.5)
GFR calc Af Amer: >60 mL/min (ref >60)
GFR calc non Af Amer: 53 mL/min — ABNORMAL LOW (ref >60)
Glucose, Bld: 101 mg/dL — ABNORMAL HIGH (ref 70–99)
Potassium: 4.2 mEq/L (ref 3.5–5.1)
Sodium: 141 mEq/L (ref 135–145)

## 2011-03-27 LAB — URINALYSIS, ROUTINE W REFLEX MICROSCOPIC
Bilirubin Urine: NEGATIVE
Glucose, UA: NEGATIVE mg/dL
Hgb urine dipstick: NEGATIVE
Ketones, ur: NEGATIVE mg/dL
Nitrite: NEGATIVE
Protein, ur: NEGATIVE mg/dL
Specific Gravity, Urine: 1.02 (ref 1.005–1.030)
Urobilinogen, UA: 0.2 mg/dL (ref 0.0–1.0)
pH: 6 (ref 5.0–8.0)

## 2011-03-27 LAB — TYPE AND SCREEN
ABO/RH(D): A POS
Antibody Screen: NEGATIVE

## 2011-03-27 LAB — CBC
HCT: 36.9 % — ABNORMAL LOW (ref 39.0–52.0)
Hemoglobin: 12.3 g/dL — ABNORMAL LOW (ref 13.0–17.0)
MCHC: 33.5 g/dL (ref 30.0–36.0)
MCV: 81 fL (ref 78.0–100.0)
Platelets: 208 10*3/uL (ref 150–400)
RBC: 4.55 MIL/uL (ref 4.22–5.81)
RDW: 14.6 % (ref 11.5–15.5)
WBC: 6 10*3/uL (ref 4.0–10.5)

## 2011-03-27 LAB — PROTIME-INR
INR: 1 (ref 0.00–1.49)
Prothrombin Time: 13.6 seconds (ref 11.6–15.2)

## 2011-03-27 LAB — ABO/RH: ABO/RH(D): A POS

## 2011-03-30 ENCOUNTER — Encounter: Payer: Self-pay | Admitting: Internal Medicine

## 2011-03-30 DIAGNOSIS — Z0000 Encounter for general adult medical examination without abnormal findings: Secondary | ICD-10-CM | POA: Insufficient documentation

## 2011-03-30 DIAGNOSIS — Z0001 Encounter for general adult medical examination with abnormal findings: Secondary | ICD-10-CM | POA: Insufficient documentation

## 2011-04-02 ENCOUNTER — Encounter: Payer: Self-pay | Admitting: Internal Medicine

## 2011-04-02 ENCOUNTER — Ambulatory Visit (INDEPENDENT_AMBULATORY_CARE_PROVIDER_SITE_OTHER): Payer: Federal, State, Local not specified - PPO | Admitting: Internal Medicine

## 2011-04-02 VITALS — BP 120/74 | HR 83 | Temp 98.5°F | Ht 66.0 in | Wt 225.0 lb

## 2011-04-02 DIAGNOSIS — D519 Vitamin B12 deficiency anemia, unspecified: Secondary | ICD-10-CM

## 2011-04-02 DIAGNOSIS — N289 Disorder of kidney and ureter, unspecified: Secondary | ICD-10-CM

## 2011-04-02 DIAGNOSIS — M25529 Pain in unspecified elbow: Secondary | ICD-10-CM

## 2011-04-02 DIAGNOSIS — E785 Hyperlipidemia, unspecified: Secondary | ICD-10-CM

## 2011-04-02 DIAGNOSIS — M25521 Pain in right elbow: Secondary | ICD-10-CM | POA: Insufficient documentation

## 2011-04-02 DIAGNOSIS — D509 Iron deficiency anemia, unspecified: Secondary | ICD-10-CM

## 2011-04-02 DIAGNOSIS — Z0000 Encounter for general adult medical examination without abnormal findings: Secondary | ICD-10-CM

## 2011-04-02 DIAGNOSIS — D518 Other vitamin B12 deficiency anemias: Secondary | ICD-10-CM

## 2011-04-02 DIAGNOSIS — Z23 Encounter for immunization: Secondary | ICD-10-CM

## 2011-04-02 MED ORDER — CYANOCOBALAMIN 1000 MCG/ML IJ SOLN
1000.0000 ug | Freq: Once | INTRAMUSCULAR | Status: AC
  Administered 2011-04-02: 1000 ug via INTRAMUSCULAR
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
## 2011-04-02 NOTE — Assessment & Plan Note (Signed)
stable overall by hx and exam, most recent lab reviewed with pt, and pt to continue medical treatment as before  Encouraged to follow lower chol diet, declines further dietary or statin at this time

## 2011-04-02 NOTE — Assessment & Plan Note (Signed)
Probable right lateral epicondyltitis 0- - for ortho referral

## 2011-04-02 NOTE — Assessment & Plan Note (Signed)
To start the OTC iron supp - 325 mg; f/u next visit

## 2011-04-02 NOTE — Patient Instructions (Addendum)
You had the b12, and tetanus shots today Please start the OTC iron pill - 325 mg per day Continue all other medications as before You will be contacted regarding the referral for: kidney ultrasound, add orthopedic Please return in 6 mo with Lab testing done 3-5 days before

## 2011-04-02 NOTE — Progress Notes (Signed)
Addended by: Oliver Barre on: 04/02/2011 01:56 PM   Modules accepted: Orders

## 2011-04-02 NOTE — Progress Notes (Signed)
Addended by: Scharlene Gloss on: 04/02/2011 02:04 PM   Modules accepted: Orders

## 2011-04-02 NOTE — Assessment & Plan Note (Signed)
New onset,  Mild, for renal u/s, doubt med related,  To f/u labs repeat next visit

## 2011-04-02 NOTE — Assessment & Plan Note (Signed)
Overall doing well, age appropriate education and counseling updated, referrals for preventative services and immunizations addressed, dietary and smoking counseling addressed, most recent labs and ECG reviewed.  I have personally reviewed and have noted: 1) the patient's medical and social history 2) The pt's use of alcohol, tobacco, and illicit drugs 3) The patient's current medications and supplements 4) Functional ability including ADL's, fall risk, home safety risk, hearing and visual impairment 5) Diet and physical activities 6) Evidence for depression or mood disorder 7) The patient's height, weight, and BMI have been recorded in the chart I have made referrals, and provided counseling and education based on review of the above For tetanus update today as well

## 2011-04-04 ENCOUNTER — Inpatient Hospital Stay: Admission: RE | Admit: 2011-04-04 | Payer: Federal, State, Local not specified - PPO | Source: Ambulatory Visit

## 2011-04-05 ENCOUNTER — Other Ambulatory Visit: Payer: Federal, State, Local not specified - PPO

## 2011-04-09 ENCOUNTER — Telehealth: Payer: Self-pay

## 2011-04-09 NOTE — Telephone Encounter (Signed)
This is usual standard of care to get u/s to eval for obstruction of the kidneys

## 2011-04-09 NOTE — Telephone Encounter (Signed)
Pt states he was never advised of kidney problems nor is he aware of having kidney problems. Can you please advised where I can find this information to give pt?

## 2011-04-09 NOTE — Telephone Encounter (Addendum)
Pt called stating he was referred for a renal ultrasound but is unsure of the reason why. Pt says  He thought he was supposed to have an ultrasound of his elbow. I advised pt that he was referred to Ortho for elbow pain. Please advise on ultrasound, I see Dx is renal insuffiencey but I cannot  See if pt was advised of this. Pt was extremely agitated and rude regarding reason for ultrasound.

## 2011-04-10 ENCOUNTER — Other Ambulatory Visit: Payer: Self-pay | Admitting: Internal Medicine

## 2011-04-11 MED ORDER — ALBUTEROL SULFATE HFA 108 (90 BASE) MCG/ACT IN AERS: 2.0000 | INHALATION_SPRAY | RESPIRATORY_TRACT | Status: DC | PRN

## 2011-04-11 NOTE — Telephone Encounter (Signed)
Pt advised and states that he will re-schedule [place] next week (going out of town tomorrow)

## 2011-04-11 NOTE — Telephone Encounter (Signed)
This was simply ordered as a result of new renal insufficiency noted on labs mar 30  Our usual workflow is I leave the message on PT, and robin calls the patient as well, but this process did not seem to work this time.  We can cancel the evaluation if pt declines.

## 2011-04-11 NOTE — Telephone Encounter (Signed)
Noted, ok

## 2011-04-30 NOTE — Op Note (Signed)
NAMEMARCE, CHARLESWORTH NO.:  [id number]   MEDICAL RECORD NO.:  [id number]          PATIENT TYPE:  INP   LOCATION:  5011                         FACILITY:  [facility]   PHYSICIAN:  Burnard Bunting, M.D.    DATE OF BIRTH:  1951/07/13   DATE OF PROCEDURE:  04/18/2009  DATE OF DISCHARGE:                               OPERATIVE REPORT   PREOPERATIVE DIAGNOSIS:  Right knee arthritis.   POSTOPERATIVE DIAGNOSIS:  Right knee arthritis.   PROCEDURE:  Right total knee replacement.   SURGEON:  Burnard Bunting, MD   ASSISTANT:  Wende Neighbors, PA   ANESTHESIA:  General endotracheal.   ESTIMATED BLOOD LOSS:  75 mL.   DRAINS:  Hemovac x1.   TOURNIQUET TIME:  2 hours and 5 minutes and 300 mmHg.   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  The patient understands the risks and benefits of the  procedure and wished to proceed.   COMPONENTS IMPLANTED:  DePuy rotating platform 4 narrow femur, 4 tibia,  12.5 poly and 41 patella.   PROCEDURE IN DETAIL:  The patient was brought to the operating room  where general endotracheal anesthesia was induced.  Preoperative  antibiotics were  administered.  Right leg operative area was pre-  scrubbed with chlorhexidine, alcohol and Betadine which allowed the air  to dry then prepped with DuraPrep solution.  Collier Flowers was used to cover the  operative field.  A time-out was called.  Leg was then elevated and  exsanguinated with Esmarch wrap, tourniquet was deflated.  An anterior  approach to the knee was utilized.  Skin and subcutaneous tissues were  sharply divided.  Median parapatellar approach was made to the knee.  Precise location was marked with a #1  Vicryl.  The patient had significant amount of synovitis in the knee  which was removed.  The patient had a large  osteochondral defect in the  medial femoral condyle as well as significant patellofemoral wear.  At  this time, the fat pad was partially excised.  Lateral patellofemoral  ligament was released.  Two pins were placed in the distal medial femur  and proximal medial tibia.  Registration points including the hip center  rotation, bimalleolar axis and various points about the knee were  obtained.  Cut was then made on the tibia perpendicular to mechanical  axis.  Tensioning device was then placed in extension and flexion.  A  distal femoral cut was made along with the box cut.  This was checked  with the spacer block.  Tibia was then keel punched and the trial  reduction was performed.  The patient was tight in flexion and extension  after the first reduction and thus it was elected to resect four more  millimeters of tibia.  This was performed and the tibia was re-keel  punched.  The patient was sized to a 4 narrow femur.  Menisci were  removed.  At this time, the patella was cut freehand and a three-peg 41  patella trial was placed after resecting about 12 mm from the 25-mm  patella.  At this time the trial with a 12.5 poly spacer in position  gave excellent extension.  It gave one and half degrees of  hyperextension with a full flexion with no lift-off and excellent  patellar tracking using no-thumbs technique.  At this time, trial  components were removed.  Cut bony surfaces were irrigated.  True  components were placed with excess cement removed.  Tourniquet was  released after cement hardening.  Bleeding points encountered were  cauterized with electrocautery.  A 12.5 spacer was utilized.  The  patient again maintained excellent stability at 0, 30 and 90 degrees and  had one and half degrees of hyperextension with neutral alignment.  Thorough irrigation was performed after cementing. Hemovac drain was  placed.  Arthrotomy was then closed over a bolster using #1 Vicryl  suture followed  by interrupted inverted 0 Vicryl suture, 2-0 Vicryl  suture and skin staples.  3-0 nylon was used to close up the incisions  for the pins.  Solution of Marcaine, morphine and clonidine was injected  into the knee.  Loose fitting knee wrap and knee immobilizer was placed.  The patient tolerated the procedure well without immediate complication.  Velna Hatchet Vernon's assistance was required at all times during the case for  retraction of important neurovascular structures.  Her assistance was a  medical necessity.      Burnard Bunting, M.D.  Electronically Signed     GSD/MEDQ  D:  04/18/2009  T:  04/19/2009  Job:  [id number]

## 2011-05-03 NOTE — H&P (Signed)
[facility]  Patient:    Darrell Hoffman, Darrell Hoffman                           MRN: [id number] Adm. Date:  [id number] Disc. Date: [id number] Attending:  Doug Sou                         History and Physical  CHIEF COMPLAINT:  Persistent wheezing and shortness of breath.  HISTORY OF PRESENT ILLNESS:  Darrell Hoffman is a 60 year old black male admitted this morning after being seen overnight in the [facility] Emergency Room, given IV steroids and oral prednisone and two albuterol nebulizer treatments. He had improved and was thought to have been stable enough to go home, but when he got there he became more dyspneic.  He says he needed to call the EMS to come back.  He had sudden onset URI and bronchitic symptoms for the last two days with acute worsening of shortness of breath and wheezing since yesterday afternoon.  He normally does well on his chronic medications without wheezing or _____ symptoms.  PAST MEDICAL HISTORY: 1. Asthma for three years. 2. Obesity. 3. Allergies.  PAST SURGICAL HISTORY:  Status post vasectomy.  ALLERGIES:  None.  MEDICATIONS:  Advair 250/50 one puff b.i.d., and albuterol metered-dose inhaler p.r.n.  SOCIAL HISTORY:  No tobacco or alcohol.  Divorced, two children.  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  FAMILY HISTORY:  Sister with asthma.  Second sister with diabetes.  Mother deceased with breast cancer and heart disease.  REVIEW OF SYSTEMS:  Otherwise negative or noncontributory.  PHYSICAL EXAMINATION:  VITAL SIGNS:  Blood pressure 153/74, heart rate 119, respirations 24-28, temperature 98.2.  GENERAL:  Darrell Hoffman is a 60 year old black male, mildly dyspneic, mildly ill, morbidly obese.  HEENT:  Sclerae are clear.  TMs without erythema.  Pharynx with moderate erythema, no exudate.  NECK:  Supple with no lymphadenopathy, JVD, or thyromegaly.  CHEST:  Decreased breath sounds bilaterally with bilateral  wheezing and a productive cough.  CARDIAC:  Regular rate and rhythm.  ABDOMEN:  Soft, nontender, positive bowel sounds, no organomegaly, no masses.  EXTREMITIES:    No edema  NEUROLOGIC:     Cranial nerves II-XII intact, otherwise nonfocal.  LABORATORY DATA:  None.  ASSESSMENT AND PLAN:   Asthma, chronic, with moderate to severe exacerbation secondary to urinary tract infection/bronchitis.  He will be admitted and given O2, albuterol nebulizers, pulmonary toilet, IV Rocephin, IV steroids, follow peak flows, chest x-ray, and routine labs. DD:  12/17/00 TD:  12/17/00 Job: [id number]

## 2011-05-03 NOTE — Discharge Summary (Signed)
Darrell Hoffman, Darrell Hoffman                  ACCOUNT NO.:  [id number]   MEDICAL RECORD NO.:  [id number]          PATIENT TYPE:  INP   LOCATION:  5011                         FACILITY:  [facility]   PHYSICIAN:  Burnard Bunting, M.D.    DATE OF BIRTH:  [birth year]   DATE OF ADMISSION:  04/18/2009  DATE OF DISCHARGE:  04/23/2009                               DISCHARGE SUMMARY   ADMISSION DIAGNOSES:  1. Right knee arthritis.  2. Asthma.   DISCHARGE DIAGNOSES:  1. Right knee arthritis.  2. Asthma.  3. Posthemorrhagic anemia.   PROCEDURE:  On Apr 18, 2009, the patient underwent right total knee  arthroplasty performed by Dr. August Saucer, assisted by Maud Deed, PA-C  under general anesthesia.   CONSULTATIONS:  None.   BRIEF HISTORY:  The patient is a 60 year old male with right knee  osteoarthritis and an osteochondral defect on the medial femoral  condyle.  He has failed conservative management and after discussion of  risks and benefits, the patient wished to proceed with operative  management in the form of right total knee arthroplasty.   BRIEF HOSPITAL COURSE:  The patient tolerated the procedure under  general anesthesia without complications.  Postoperatively,  neurovascular motor function of the lower extremities was noted to be  intact.  Dressing changes were done daily, and the patient's wound was  healing without signs or symptoms of infection.  He was placed on  Coumadin for DVT prophylaxis.  Adjustments in Coumadin made daily by the  pharmacist.  At discharge, INR was noted to be 2.5.  The patient's  hemoglobin on admission was 12.3 with a hematocrit of 36.9.  Hemoglobin  and hematocrit dropped to lowest value of 7.7 and 23.0.  He did not  receive transfusion during the hospital stay as he was asymptomatic for  his acute blood loss anemia.  The patient was started on physical  therapy for ambulation and gait training, range of motion, stretching  and strengthening exercises.  CPM was  utilized.  He was allowed  weightbearing as tolerated.  He was able to progress well with physical  therapy and was discharged from physical therapy on Apr 23, 2009.  The  patient's pain was controlled with p.o. analgesics.  He was taking a  regular diet at discharge.  On Apr 23, 2009, he was discharged to his  home after arrangements made for home health physical therapy, durable  medical equipment, and Coumadin management.   OTHER PERTINENT LABORATORY VALUES:  Chemistry studies on admission with  glucose 101.  Repeat chemistry with 1 episode of hyponatremia at 134.  Remaining values within normal limits.  Urinalysis negative for urinary  tract infection with negative urine culture on final growth April 14, 2009.   PLAN:  The patient is discharged to his home.  Physical therapy, durable  medical equipment, and Coumadin management provided by [facility].  He will continue weightbearing as tolerated utilizing a  walker for ambulation and use the CPM machine 6 hours per day.  He will  keep his incision dry and clean at  all times.  The  patient is instructed to follow up with Dr. August Saucer 2 weeks from the date  of surgery.  The patient will continue on home medications as taken  prior to admission.  Prescriptions given for Percocet and Coumadin.   CONDITION ON DISCHARGE:  Stable.      Wende Neighbors, P.A.      Burnard Bunting, M.D.  Electronically Signed    SMV/MEDQ  D:  06/08/2009  T:  06/09/2009  Job:  [id number]

## 2011-05-03 NOTE — Discharge Summary (Signed)
[facility]  Patient:    Darrell Hoffman, Darrell Hoffman                           MRN: [id number] Adm. Date:  [id number] Attending:  [name] CC:         [name], M.D. [facility]   Discharge Summary  ADMISSION DIAGNOSIS:  Asthma, status asthmaticus.  DISCHARGE DIAGNOSES: 1. Asthma, status asthmaticus. 2. Hyperglycemia.  HISTORY OF PRESENT ILLNESS:  Darrell Hoffman is a 60 year old African-American admitted after being treated in the emergency room for asthma with failure to resolve on parenteral steroids and oral steroids and topical bronchodilators. He was released from the emergency room but became more dyspneic at home.  He was transported back to the emergency room by EMS.  There had been acute decompensation over the two days prior to admission, with symptoms of an upper respiratory tract infection.  Significantly he has had some increased dust exposure in his home environment.  He does not have central heat and also has a dog in the home environment.  He has never been skin tested for allergies and has had asthma only for three years.  He specifically denies any history of childhood asthma.  He has been on Advair 250/50 1 puff twice a day and albuterol as needed.  He does not smoke.  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  At the time of his admission he had decreased breath sounds with diffuse wheezing and a productive cough.  LABORATORY DATA:  Admission chest x-ray revealed no active cardiopulmonary process.  His pO2 was 86 on FIO2 of 28% with a pCO2 of 42.  White count was 9900, hematocrit was 35.3.  He did have a left shift with 9.2% neutrophils.  His glucose was 156.  Hemoglobin A1C was checked and was 5.3, indicating absence of diabetes.  HOSPITAL COURSE:  He received aggressive pulmonary toilet and IV steroids. The asthma was somewhat recalcitrant.  A spiral CT was performed on  December 20, 2000, because of continued wheezing and some right pleuritic pain.  This was negative for pulmonary emboli.  On the day of discharge he had some additional exertional dyspnea.  He also had some pleuritic pain with cough only.  His O2 saturations were 98% on 2 L. Breath sounds were decreased without wheezing.  He continued to have a resting tachycardia.  He was discharged on Protonix 40 mg each morning as there was a possible component of reflux with his asthma.  He was to complete the Z-Pak initiated in the hospital.  He was placed on Advair 500/50 1 inhalation every 12 hours. He was to use albuterol 1-2 puffs every four hours as needed.  He was also placed on Duratuss-G 1200 mg every 12 hours and asked to increase oral hydration, avoiding dairy products.  He was discharged on prednisone 20 mg twice a day with food for five days, then 1 daily for five days.  Because of the question of possible allergic component he was placed on Allegra 180 mg each morning and Singulair 10 mg each evening.  He was to see Dr. Jonny Ruiz in three to five days.  Weight is excessive, and his safe weight would be less than 190, and an ideal weight less than 175.  Low fat diet will be recommended.  The book, "Simply the Best" which is Weight Watchers 250  best recipes, was recommended to him. He was instructed to avoid dusty environments.  He was asked to keep his dog out of the bedroom.  He will plan to relocate from the home while central heat and air is employed.  He will be asked to discuss possible allergy testing if symptoms fail to respond.  He will need to be off the Allegra for approximately two weeks prior to the skin testing by an allergist, however.  CONDITION ON DISCHARGE:  Improved.  PROGNOSIS:  Good. DD:  12/21/00 TD:  12/21/00 Job: [id number]

## 2011-05-06 ENCOUNTER — Ambulatory Visit: Payer: Federal, State, Local not specified - PPO

## 2011-05-07 ENCOUNTER — Ambulatory Visit (INDEPENDENT_AMBULATORY_CARE_PROVIDER_SITE_OTHER): Payer: Federal, State, Local not specified - PPO

## 2011-05-07 DIAGNOSIS — E538 Deficiency of other specified B group vitamins: Secondary | ICD-10-CM

## 2011-05-07 MED ORDER — CYANOCOBALAMIN 1000 MCG/ML IJ SOLN
1000.0000 ug | Freq: Once | INTRAMUSCULAR | Status: AC
  Administered 2011-05-07: 1000 ug via INTRAMUSCULAR

## 2011-09-12 ENCOUNTER — Telehealth: Payer: Self-pay | Admitting: Internal Medicine

## 2011-09-12 MED ORDER — FLUTICASONE PROPIONATE 50 MCG/ACT NA SUSP: 2.0000 | Freq: Every day | NASAL | Status: DC

## 2011-09-12 NOTE — Telephone Encounter (Signed)
Ok for [employer], but remember if has a viral infection and feeling poorly, maybe even feverish, the flonase may not help as it is ued for nasal allergy congestion  Done per emr

## 2011-09-12 NOTE — Telephone Encounter (Signed)
PT is requesting a prescription for Flonase be called in .  Not on his list, but he says he has used it in the past.  He has a cold.

## 2011-09-13 NOTE — Telephone Encounter (Signed)
Patient informed. 

## 2011-10-07 ENCOUNTER — Ambulatory Visit: Payer: Federal, State, Local not specified - PPO | Admitting: Internal Medicine

## 2011-10-07 DIAGNOSIS — Z0289 Encounter for other administrative examinations: Secondary | ICD-10-CM

## 2012-03-23 ENCOUNTER — Encounter: Payer: Self-pay | Admitting: Internal Medicine

## 2012-03-23 ENCOUNTER — Ambulatory Visit (INDEPENDENT_AMBULATORY_CARE_PROVIDER_SITE_OTHER): Payer: Federal, State, Local not specified - PPO | Admitting: Internal Medicine

## 2012-03-23 ENCOUNTER — Other Ambulatory Visit (INDEPENDENT_AMBULATORY_CARE_PROVIDER_SITE_OTHER): Payer: Federal, State, Local not specified - PPO

## 2012-03-23 VITALS — BP 130/80 | HR 76 | Temp 99.2°F | Ht 66.5 in | Wt 213.4 lb

## 2012-03-23 DIAGNOSIS — Z0000 Encounter for general adult medical examination without abnormal findings: Secondary | ICD-10-CM

## 2012-03-23 DIAGNOSIS — D518 Other vitamin B12 deficiency anemias: Secondary | ICD-10-CM

## 2012-03-23 DIAGNOSIS — D509 Iron deficiency anemia, unspecified: Secondary | ICD-10-CM

## 2012-03-23 DIAGNOSIS — A64 Unspecified sexually transmitted disease: Secondary | ICD-10-CM | POA: Insufficient documentation

## 2012-03-23 LAB — CBC WITH DIFFERENTIAL/PLATELET
Basophils Absolute: 0 10*3/uL (ref 0.0–0.1)
Basophils Relative: 0.3 % (ref 0.0–3.0)
Eosinophils Absolute: 0.1 10*3/uL (ref 0.0–0.7)
Eosinophils Relative: 0.9 % (ref 0.0–5.0)
HCT: 33.3 % — ABNORMAL LOW (ref 39.0–52.0)
Hemoglobin: 10.7 g/dL — ABNORMAL LOW (ref 13.0–17.0)
Lymphocytes Relative: 42.9 % (ref 12.0–46.0)
Lymphs Abs: 2.8 10*3/uL (ref 0.7–4.0)
MCHC: 32 g/dL (ref 30.0–36.0)
MCV: 82.5 fl (ref 78.0–100.0)
Monocytes Absolute: 0.4 10*3/uL (ref 0.1–1.0)
Monocytes Relative: 5.6 % (ref 3.0–12.0)
Neutro Abs: 3.3 10*3/uL (ref 1.4–7.7)
Neutrophils Relative %: 50.3 % (ref 43.0–77.0)
Platelets: 209 10*3/uL (ref 150.0–400.0)
RBC: 4.04 Mil/uL — ABNORMAL LOW (ref 4.22–5.81)
RDW: 14.6 % (ref 11.5–14.6)
WBC: 6.5 10*3/uL (ref 4.5–10.5)

## 2012-03-23 LAB — URINALYSIS, ROUTINE W REFLEX MICROSCOPIC
Bilirubin Urine: NEGATIVE
Hgb urine dipstick: NEGATIVE
Ketones, ur: NEGATIVE
Leukocytes, UA: NEGATIVE
Nitrite: NEGATIVE
Specific Gravity, Urine: 1.02 (ref 1.000–1.030)
Total Protein, Urine: NEGATIVE
Urine Glucose: NEGATIVE
Urobilinogen, UA: 0.2 (ref 0.0–1.0)
pH: 6 (ref 5.0–8.0)

## 2012-03-23 LAB — FERRITIN: Ferritin: 10.9 ng/mL — ABNORMAL LOW (ref 22.0–322.0)

## 2012-03-23 LAB — LIPID PANEL
Cholesterol: 203 mg/dL — ABNORMAL HIGH (ref 0–200)
HDL: 44.6 mg/dL (ref >39.00)
Total CHOL/HDL Ratio: 5
Triglycerides: 230 mg/dL — ABNORMAL HIGH (ref 0.0–149.0)
VLDL: 46 mg/dL — ABNORMAL HIGH (ref 0.0–40.0)

## 2012-03-23 LAB — BASIC METABOLIC PANEL
BUN: 11 mg/dL (ref 6–23)
CO2: 27 mEq/L (ref 19–32)
Calcium: 8.9 mg/dL (ref 8.4–10.5)
Chloride: 106 mEq/L (ref 96–112)
Creatinine, Ser: 1.2 mg/dL (ref 0.4–1.5)
GFR: 76.56 mL/min (ref >60.00)
Glucose, Bld: 81 mg/dL (ref 70–99)
Potassium: 4.2 mEq/L (ref 3.5–5.1)
Sodium: 141 mEq/L (ref 135–145)

## 2012-03-23 LAB — IBC PANEL
Iron: 61 ug/dL (ref 42–165)
Saturation Ratios: 16 % — ABNORMAL LOW (ref 20.0–50.0)
Transferrin: 271.8 mg/dL (ref 212.0–360.0)

## 2012-03-23 LAB — TSH: TSH: 2.49 u[IU]/mL (ref 0.35–5.50)

## 2012-03-23 LAB — HEPATIC FUNCTION PANEL
ALT: 18 U/L (ref 0–53)
AST: 21 U/L (ref 0–37)
Albumin: 4 g/dL (ref 3.5–5.2)
Alkaline Phosphatase: 49 U/L (ref 39–117)
Bilirubin, Direct: 0 mg/dL (ref 0.0–0.3)
Total Bilirubin: 0.2 mg/dL — ABNORMAL LOW (ref 0.3–1.2)
Total Protein: 6.7 g/dL (ref 6.0–8.3)

## 2012-03-23 LAB — VITAMIN B12: Vitamin B-12: 160 pg/mL — ABNORMAL LOW (ref 211–911)

## 2012-03-23 LAB — PSA: PSA: 0.62 ng/mL (ref 0.10–4.00)

## 2012-03-23 NOTE — Progress Notes (Signed)
Subjective:    Patient ID: Darrell Hoffman, male    DOB: 02/23/1951, 61 y.o.   MRN: [id number]  HPI  Here for wellness and f/u;  Overall doing ok;  Pt denies CP, worsening SOB, DOE, wheezing, orthopnea, PND, worsening LE edema, palpitations, dizziness or syncope.  Pt denies neurological change such as new Headache, facial or extremity weakness.  Pt denies polydipsia, polyuria, or low sugar symptoms. Pt states overall good compliance with treatment and medications, good tolerability, and trying to follow lower cholesterol diet.  Pt denies worsening depressive symptoms, suicidal ideation or panic. No fever, wt loss, night sweats, loss of appetite, or other constitutional symptoms.  Pt states good ability with ADL's, low fall risk, home safety reviewed and adequate, no significant changes in hearing or vision, and occasionally active with exercise.  No acute complaints.  Does ask for STD check though not specific about risk or recent exposure Past Medical History  Diagnosis Date  . B12 DEFICIENCY 10/06/2008  . HYPERLIPIDEMIA 10/03/2007  . Morbid obesity 10/03/2007  . ANEMIA-IRON DEFICIENCY 11/27/2009  . ERECTILE DYSFUNCTION 10/03/2007  . HYPERTENSION 10/03/2007  . ALLERGIC RHINITIS 10/03/2007  . ASTHMA 10/03/2007  . GERD 10/03/2007  . KNEE PAIN, RIGHT 10/08/2007  . ALCOHOL ABUSE, HX OF 10/03/2007  . COLONIC POLYPS, HX OF 10/03/2007  . ANEMIA-NOS 10/08/2007  . ANEMIA-B12 DEFICIENCY 12/04/2009   Past Surgical History  Procedure Date  . Vasectomy   . Bilateral hydrocele repair   . S/p tkr 03/2009  . S/p left knee arthroscopy 2009    reports that he has never smoked. He does not have any smokeless tobacco history on file. He reports that he drinks alcohol. He reports that he uses illicit drugs. family history includes Alcohol abuse in his paternal uncle; Breast cancer in his mother; Coronary artery disease in his other; Diabetes in his cousin and sister; Hypertension in his sister; and Lupus in  his sister. No Known Allergies Current Outpatient Prescriptions on File Prior to Visit  Medication Sig Dispense Refill  . ADVAIR DISKUS 500-50 MCG/DOSE AEPB INHALE 1 PUFF BY MOUTH TWICE DAILY  1 each  11  . albuterol (PROVENTIL HFA) 108 (90 BASE) MCG/ACT inhaler Inhale 2 puffs into the lungs every 4 (four) hours as needed.  3 Inhaler  1  . CIALIS 20 MG tablet TAKE 1 TABLET BY MOUTH DAILY AS NEEDED  5 tablet  11  . cyanocobalamin (,VITAMIN B-12,) 1000 MCG/ML injection Inject 1,000 mcg into the muscle. Inject 1 ML IM once per month       . fluticasone (FLONASE) 50 MCG/ACT nasal spray Place 2 sprays into the nose daily.  16 g  2  . omeprazole (PRILOSEC) 20 MG capsule TAKE 1 CAPSULE BY MOUTH TWICE DAILY  60 capsule  11  . SINGULAIR 10 MG tablet TAKE 1 TABLET BY MOUTH DAILY  30 tablet  11   Review of Systems Review of Systems  Constitutional: Negative for diaphoresis, activity change, appetite change and unexpected weight change.  HENT: Negative for hearing loss, ear pain, facial swelling, mouth sores and neck stiffness.   Eyes: Negative for pain, redness and visual disturbance.  Respiratory: Negative for shortness of breath and wheezing.   Cardiovascular: Negative for chest pain and palpitations.  Gastrointestinal: Negative for diarrhea, blood in stool, abdominal distention and rectal pain.  Genitourinary: Negative for hematuria, flank pain and decreased urine volume.  Musculoskeletal: Negative for myalgias and joint swelling.  Skin: Negative for color change and wound.  Neurological: Negative for syncope and numbness.  Hematological: Negative for adenopathy.  Psychiatric/Behavioral: Negative for hallucinations, self-injury, decreased concentration and agitation.      Objective:   Physical Exam BP 130/80  Pulse 76  Temp(Src) 99.2 F (37.3 C) (Oral)  Ht 5' 6.5" (1.689 m)  Wt 213 lb 6 oz (96.786 kg)  BMI 33.92 kg/m2  SpO2 97% Physical Exam  VS noted, not ill appearing Constitutional:  Pt is oriented to person, place, and time. Appears well-developed and well-nourished.  HENT:  Head: Normocephalic and atraumatic.  Right Ear: External ear normal.  Left Ear: External ear normal.  Nose: Nose normal.  Mouth/Throat: Oropharynx is clear and moist.  Eyes: Conjunctivae and EOM are normal. Pupils are equal, round, and reactive to light.  Neck: Normal range of motion. Neck supple. No JVD present. No tracheal deviation present.  Cardiovascular: Normal rate, regular rhythm, normal heart sounds and intact distal pulses.   Pulmonary/Chest: Effort normal and breath sounds normal.  Abdominal: Soft. Bowel sounds are normal. There is no tenderness.  Musculoskeletal: Normal range of motion. Exhibits no edema.  Lymphadenopathy:  Has no cervical adenopathy.  Neurological: Pt is alert and oriented to person, place, and time. Pt has normal reflexes. No cranial nerve deficit. Motor/sens/dtr intact Skin: Skin is warm and dry. No rash noted.  Psychiatric:  Behavior is normal. 1+ nervous    Assessment & Plan:

## 2012-03-23 NOTE — Assessment & Plan Note (Signed)

## 2012-03-23 NOTE — Assessment & Plan Note (Signed)
To also f/u iron, did not f/u eval 2012, may need GI eval

## 2012-03-23 NOTE — Assessment & Plan Note (Signed)
Noncompliant with b12 tx, for b12 check today

## 2012-03-23 NOTE — Patient Instructions (Signed)
Continue all other medications as before Please have the pharmacy call with any refills you may need. Please go to LAB in the Basement for the blood and/or urine tests to be done today You will be contacted by phone if any changes need to be made immediately.  Otherwise, you will receive a letter about your results with an explanation. Please return in 1 year for your yearly visit, or sooner if needed, with Lab testing done 3-5 days before  

## 2012-03-24 ENCOUNTER — Encounter: Payer: Self-pay | Admitting: Internal Medicine

## 2012-03-24 LAB — LDL CHOLESTEROL, DIRECT: Direct LDL: 114.6 mg/dL

## 2012-04-09 ENCOUNTER — Other Ambulatory Visit: Payer: Self-pay | Admitting: Internal Medicine

## 2012-04-09 DIAGNOSIS — A64 Unspecified sexually transmitted disease: Secondary | ICD-10-CM

## 2012-04-13 ENCOUNTER — Other Ambulatory Visit: Payer: Federal, State, Local not specified - PPO

## 2012-04-13 ENCOUNTER — Telehealth: Payer: Self-pay | Admitting: Internal Medicine

## 2012-04-13 DIAGNOSIS — A64 Unspecified sexually transmitted disease: Secondary | ICD-10-CM

## 2012-04-13 LAB — HIV ANTIBODY (ROUTINE TESTING W REFLEX): HIV: NONREACTIVE

## 2012-04-13 NOTE — Telephone Encounter (Signed)
Message copied by [name] on Mon Apr 13, 2012  3:58 PM ------      Message from: Scharlene Gloss B      Created: Mon Apr 13, 2012  3:44 PM       Need to add GC Chlamydia urine

## 2012-04-13 NOTE — Telephone Encounter (Signed)
Done again

## 2012-04-14 ENCOUNTER — Encounter: Payer: Self-pay | Admitting: Internal Medicine

## 2012-04-14 LAB — RPR

## 2012-04-14 LAB — GC/CHLAMYDIA PROBE AMP, URINE
Chlamydia, Swab/Urine, PCR: NEGATIVE
GC Probe Amp, Urine: NEGATIVE

## 2012-04-14 LAB — HSV 2 ANTIBODY, IGG: HSV 2 Glycoprotein G Ab, IgG: <0.1 IV

## 2012-04-16 ENCOUNTER — Other Ambulatory Visit: Payer: Self-pay | Admitting: Internal Medicine

## 2012-04-23 ENCOUNTER — Other Ambulatory Visit: Payer: Self-pay | Admitting: Internal Medicine

## 2012-04-27 ENCOUNTER — Other Ambulatory Visit: Payer: Self-pay | Admitting: Internal Medicine

## 2012-11-17 ENCOUNTER — Encounter: Payer: Self-pay | Admitting: Gastroenterology

## 2012-12-05 ENCOUNTER — Other Ambulatory Visit: Payer: Self-pay | Admitting: Internal Medicine

## 2012-12-07 ENCOUNTER — Other Ambulatory Visit: Payer: Self-pay

## 2012-12-07 MED ORDER — FLUTICASONE-SALMETEROL 500-50 MCG/DOSE IN AEPB: 1.0000 | INHALATION_SPRAY | Freq: Once | RESPIRATORY_TRACT | Status: DC

## 2012-12-17 ENCOUNTER — Ambulatory Visit: Payer: Federal, State, Local not specified - PPO | Admitting: Internal Medicine

## 2013-02-02 ENCOUNTER — Other Ambulatory Visit: Payer: Self-pay | Admitting: Family Medicine

## 2013-02-02 DIAGNOSIS — M542 Cervicalgia: Secondary | ICD-10-CM

## 2013-02-04 ENCOUNTER — Ambulatory Visit
Admission: RE | Admit: 2013-02-04 | Discharge: 2013-02-04 | Disposition: A | Discharge status: Home / Self Care | Payer: Federal, State, Local not specified - PPO | Source: Ambulatory Visit | Attending: Family Medicine | Admitting: Family Medicine

## 2013-02-04 DIAGNOSIS — M542 Cervicalgia: Secondary | ICD-10-CM

## 2013-02-05 ENCOUNTER — Telehealth: Payer: Self-pay

## 2013-02-05 MED ORDER — FLUTICASONE-SALMETEROL 500-50 MCG/DOSE IN AEPB: 1.0000 | INHALATION_SPRAY | Freq: Two times a day (BID) | RESPIRATORY_TRACT | Status: DC

## 2013-02-05 NOTE — Telephone Encounter (Signed)
Pharmacy need clarification by MD on instructions on advair.  The patient states he does 2 puffs a day but recent refill stated only do one puff daily.   Please advise

## 2013-02-05 NOTE — Telephone Encounter (Signed)
Patient informed of correction.

## 2013-02-05 NOTE — Telephone Encounter (Signed)
rx corrected and resent

## 2013-03-22 ENCOUNTER — Ambulatory Visit (INDEPENDENT_AMBULATORY_CARE_PROVIDER_SITE_OTHER): Payer: Federal, State, Local not specified - PPO

## 2013-03-22 ENCOUNTER — Ambulatory Visit (INDEPENDENT_AMBULATORY_CARE_PROVIDER_SITE_OTHER): Payer: Federal, State, Local not specified - PPO | Admitting: Internal Medicine

## 2013-03-22 ENCOUNTER — Encounter: Payer: Self-pay | Admitting: Internal Medicine

## 2013-03-22 VITALS — BP 130/80 | HR 80 | Temp 98.1°F | Ht 66.5 in | Wt 215.4 lb

## 2013-03-22 DIAGNOSIS — Z0000 Encounter for general adult medical examination without abnormal findings: Secondary | ICD-10-CM

## 2013-03-22 LAB — BASIC METABOLIC PANEL
BUN: 10 mg/dL (ref 6–23)
CO2: 26 mEq/L (ref 19–32)
Calcium: 8.6 mg/dL (ref 8.4–10.5)
Chloride: 107 mEq/L (ref 96–112)
Creatinine, Ser: 1.3 mg/dL (ref 0.4–1.5)
GFR: 72.26 mL/min (ref >60.00)
Glucose, Bld: 112 mg/dL — ABNORMAL HIGH (ref 70–99)
Potassium: 4.3 mEq/L (ref 3.5–5.1)
Sodium: 143 mEq/L (ref 135–145)

## 2013-03-22 LAB — LIPID PANEL
Cholesterol: 173 mg/dL (ref 0–200)
HDL: 37.3 mg/dL — ABNORMAL LOW (ref >39.00)
Total CHOL/HDL Ratio: 5
Triglycerides: 228 mg/dL — ABNORMAL HIGH (ref 0.0–149.0)
VLDL: 45.6 mg/dL — ABNORMAL HIGH (ref 0.0–40.0)

## 2013-03-22 LAB — URINALYSIS, ROUTINE W REFLEX MICROSCOPIC
Bilirubin Urine: NEGATIVE
Hgb urine dipstick: NEGATIVE
Ketones, ur: NEGATIVE
Leukocytes, UA: NEGATIVE
Nitrite: NEGATIVE
Specific Gravity, Urine: 1.015 (ref 1.000–1.030)
Total Protein, Urine: NEGATIVE
Urine Glucose: NEGATIVE
Urobilinogen, UA: 0.2 (ref 0.0–1.0)
pH: 7 (ref 5.0–8.0)

## 2013-03-22 LAB — CBC WITH DIFFERENTIAL/PLATELET
Basophils Absolute: 0 10*3/uL (ref 0.0–0.1)
Basophils Relative: 0.3 % (ref 0.0–3.0)
Eosinophils Absolute: 0.1 10*3/uL (ref 0.0–0.7)
Eosinophils Relative: 1.5 % (ref 0.0–5.0)
HCT: 32.3 % — ABNORMAL LOW (ref 39.0–52.0)
Hemoglobin: 10.7 g/dL — ABNORMAL LOW (ref 13.0–17.0)
Lymphocytes Relative: 38.7 % (ref 12.0–46.0)
Lymphs Abs: 2.4 10*3/uL (ref 0.7–4.0)
MCHC: 33.1 g/dL (ref 30.0–36.0)
MCV: 81.4 fl (ref 78.0–100.0)
Monocytes Absolute: 0.4 10*3/uL (ref 0.1–1.0)
Monocytes Relative: 6.5 % (ref 3.0–12.0)
Neutro Abs: 3.2 10*3/uL (ref 1.4–7.7)
Neutrophils Relative %: 53 % (ref 43.0–77.0)
Platelets: 200 10*3/uL (ref 150.0–400.0)
RBC: 3.97 Mil/uL — ABNORMAL LOW (ref 4.22–5.81)
RDW: 14 % (ref 11.5–14.6)
WBC: 6.1 10*3/uL (ref 4.5–10.5)

## 2013-03-22 LAB — PSA: PSA: 0.6 ng/mL (ref 0.10–4.00)

## 2013-03-22 LAB — HEPATIC FUNCTION PANEL
ALT: 18 U/L (ref 0–53)
AST: 22 U/L (ref 0–37)
Albumin: 3.8 g/dL (ref 3.5–5.2)
Alkaline Phosphatase: 52 U/L (ref 39–117)
Bilirubin, Direct: 0.1 mg/dL (ref 0.0–0.3)
Total Bilirubin: 0.4 mg/dL (ref 0.3–1.2)
Total Protein: 6.5 g/dL (ref 6.0–8.3)

## 2013-03-22 LAB — TSH: TSH: 2.66 u[IU]/mL (ref 0.35–5.50)

## 2013-03-22 MED ORDER — FLUTICASONE PROPIONATE 50 MCG/ACT NA SUSP: 2.0000 | Freq: Every day | NASAL | Status: DC

## 2013-03-22 NOTE — Progress Notes (Signed)
Subjective:    Patient ID: Darrell Hoffman, male    DOB: 1951-06-25, 62 y.o.   MRN: [id number]  HPI  Here for wellness and f/u;  Overall doing ok;  Pt denies CP, worsening SOB, DOE, wheezing, orthopnea, PND, worsening LE edema, palpitations, dizziness or syncope.  Pt denies neurological change such as new headache, facial or extremity weakness.  Pt denies polydipsia, polyuria, or low sugar symptoms. Pt states overall good compliance with treatment and medications, good tolerability, and has been trying to follow lower cholesterol diet.  Pt denies worsening depressive symptoms, suicidal ideation or panic. No fever, night sweats, wt loss, loss of appetite, or other constitutional symptoms.  Pt states good ability with ADL's, has low fall risk, home safety reviewed and adequate, no other significant changes in hearing or vision, and only occasionally active with exercise.  No acute complaints Past Medical History  Diagnosis Date  . B12 DEFICIENCY 10/06/2008  . HYPERLIPIDEMIA 10/03/2007  . Morbid obesity 10/03/2007  . ANEMIA-IRON DEFICIENCY 11/27/2009  . ERECTILE DYSFUNCTION 10/03/2007  . HYPERTENSION 10/03/2007  . ALLERGIC RHINITIS 10/03/2007  . ASTHMA 10/03/2007  . GERD 10/03/2007  . KNEE PAIN, RIGHT 10/08/2007  . ALCOHOL ABUSE, HX OF 10/03/2007  . COLONIC POLYPS, HX OF 10/03/2007  . ANEMIA-NOS 10/08/2007  . ANEMIA-B12 DEFICIENCY 12/04/2009   Past Surgical History  Procedure Laterality Date  . Vasectomy    . Bilateral hydrocele repair    . S/p tkr  03/2009  . S/p left knee arthroscopy  2009    reports that he has never smoked. He does not have any smokeless tobacco history on file. He reports that  drinks alcohol. He reports that he uses illicit drugs. family history includes Alcohol abuse in his paternal uncle; Breast cancer in his mother; Coronary artery disease in his other; Diabetes in his cousin and sister; Hypertension in his sister; and Lupus in his sister. No Known Allergies Current  Outpatient Prescriptions on File Prior to Visit  Medication Sig Dispense Refill  . albuterol (PROVENTIL HFA) 108 (90 BASE) MCG/ACT inhaler Inhale 2 puffs into the lungs every 4 (four) hours as needed.  3 Inhaler  1  . CIALIS 20 MG tablet TAKE 1 TABLET BY MOUTH DAILY AS NEEDED  5 tablet  11  . cyanocobalamin (,VITAMIN B-12,) 1000 MCG/ML injection Inject 1,000 mcg into the muscle. Inject 1 ML IM once per month       . Fluticasone-Salmeterol (ADVAIR DISKUS) 500-50 MCG/DOSE AEPB Inhale 1 puff into the lungs 2 (two) times daily.  1 each  6  . omeprazole (PRILOSEC) 20 MG capsule TAKE 1 CAPSULE BY MOUTH TWICE DAILY  60 capsule  11  . SINGULAIR 10 MG tablet TAKE 1 TABLET BY MOUTH DAILY  30 tablet  11   No current facility-administered medications on file prior to visit.   Review of Systems Constitutional: Negative for diaphoresis, activity change, appetite change or unexpected weight change.  HENT: Negative for hearing loss, ear pain, facial swelling, mouth sores and neck stiffness.   Eyes: Negative for pain, redness and visual disturbance.  Respiratory: Negative for shortness of breath and wheezing.   Cardiovascular: Negative for chest pain and palpitations.  Gastrointestinal: Negative for diarrhea, blood in stool, abdominal distention or other pain Genitourinary: Negative for hematuria, flank pain or change in urine volume.  Musculoskeletal: Negative for myalgias and joint swelling.  Skin: Negative for color change and wound.  Neurological: Negative for syncope and numbness. other than noted Hematological: Negative for adenopathy.  Psychiatric/Behavioral: Negative for hallucinations, self-injury, decreased concentration and agitation.      Objective:   Physical Exam BP 130/80  Pulse 80  Temp(Src) 98.1 F (36.7 C) (Oral)  Ht 5' 6.5" (1.689 m)  Wt 215 lb 6 oz (97.693 kg)  BMI 34.25 kg/m2  SpO2 98% VS noted,  Constitutional: Pt is oriented to person, place, and time. Appears  well-developed and well-nourished.  Head: Normocephalic and atraumatic.  Right Ear: External ear normal.  Left Ear: External ear normal.  Nose: Nose normal.  Mouth/Throat: Oropharynx is clear and moist.  Eyes: Conjunctivae and EOM are normal. Pupils are equal, round, and reactive to light.  Neck: Normal range of motion. Neck supple. No JVD present. No tracheal deviation present.  Cardiovascular: Normal rate, regular rhythm, normal heart sounds and intact distal pulses.   Pulmonary/Chest: Effort normal and breath sounds normal.  Abdominal: Soft. Bowel sounds are normal. There is no tenderness. No HSM  Musculoskeletal: Normal range of motion. Exhibits no edema.  Lymphadenopathy:  Has no cervical adenopathy.  Neurological: Pt is alert and oriented to person, place, and time. Pt has normal reflexes. No cranial nerve deficit.  Skin: Skin is warm and dry. No rash noted.  Psychiatric:  Has  normal mood and affect. Behavior is normal.     Assessment & Plan:

## 2013-03-22 NOTE — Patient Instructions (Addendum)
Please continue all other medications as before, and refills have been done if requested - the flonase Please have the pharmacy call with any other refills you may need. Please continue your efforts at being more active, low cholesterol diet, and weight control. Please go to the LAB in the Basement (turn left off the elevator) for the tests to be done today You will be contacted by phone if any changes need to be made immediately.  Otherwise, you will receive a letter about your results with an explanation, but please check with MyChart first. Please consider having the shingles shot done You are otherwise up to date with prevention measures today. Thank you for enrolling in MyChart. Please follow the instructions below to securely access your online medical record. MyChart allows you to send messages to your doctor, view your test results, renew your prescriptions, schedule appointments, and more. To Log into My Chart online, please go by [employer] or [employer] to [email or web address], or download the MyChart App from the [employer] of [employer] .  Your Username is: [email or web address], pass [email or web address] Please send a practice Message on Mychart later today. Please return in 1 year for your yearly visit, or sooner if needed, with Lab testing done 3-5 days before

## 2013-03-22 NOTE — Assessment & Plan Note (Signed)

## 2013-03-23 LAB — LDL CHOLESTEROL, DIRECT: Direct LDL: 97.3 mg/dL

## 2013-05-07 ENCOUNTER — Other Ambulatory Visit: Payer: Self-pay | Admitting: Internal Medicine

## 2013-08-10 ENCOUNTER — Encounter: Payer: Self-pay | Admitting: Gastroenterology

## 2013-10-21 ENCOUNTER — Other Ambulatory Visit: Payer: Self-pay

## 2013-10-26 ENCOUNTER — Other Ambulatory Visit: Payer: Self-pay | Admitting: Internal Medicine

## 2013-11-27 ENCOUNTER — Other Ambulatory Visit: Payer: Self-pay | Admitting: Internal Medicine

## 2014-01-03 ENCOUNTER — Other Ambulatory Visit: Payer: Self-pay | Admitting: Internal Medicine

## 2014-03-24 ENCOUNTER — Encounter: Payer: Self-pay | Admitting: Internal Medicine

## 2014-03-24 ENCOUNTER — Other Ambulatory Visit (INDEPENDENT_AMBULATORY_CARE_PROVIDER_SITE_OTHER): Payer: Federal, State, Local not specified - PPO

## 2014-03-24 ENCOUNTER — Ambulatory Visit (INDEPENDENT_AMBULATORY_CARE_PROVIDER_SITE_OTHER): Payer: Federal, State, Local not specified - PPO | Admitting: Internal Medicine

## 2014-03-24 VITALS — BP 122/82 | HR 88 | Temp 99.0°F | Ht 66.5 in | Wt 213.0 lb

## 2014-03-24 DIAGNOSIS — Z0000 Encounter for general adult medical examination without abnormal findings: Secondary | ICD-10-CM

## 2014-03-24 LAB — CBC WITH DIFFERENTIAL/PLATELET
Basophils Absolute: 0 10*3/uL (ref 0.0–0.1)
Basophils Relative: 0.5 % (ref 0.0–3.0)
Eosinophils Absolute: 0.1 10*3/uL (ref 0.0–0.7)
Eosinophils Relative: 2 % (ref 0.0–5.0)
HCT: 35 % — ABNORMAL LOW (ref 39.0–52.0)
Hemoglobin: 11.4 g/dL — ABNORMAL LOW (ref 13.0–17.0)
Lymphocytes Relative: 41.8 % (ref 12.0–46.0)
Lymphs Abs: 3 10*3/uL (ref 0.7–4.0)
MCHC: 32.4 g/dL (ref 30.0–36.0)
MCV: 83.9 fl (ref 78.0–100.0)
Monocytes Absolute: 0.4 10*3/uL (ref 0.1–1.0)
Monocytes Relative: 6 % (ref 3.0–12.0)
Neutro Abs: 3.6 10*3/uL (ref 1.4–7.7)
Neutrophils Relative %: 49.7 % (ref 43.0–77.0)
Platelets: 216 10*3/uL (ref 150.0–400.0)
RBC: 4.17 Mil/uL — ABNORMAL LOW (ref 4.22–5.81)
RDW: 14.3 % (ref 11.5–14.6)
WBC: 7.2 10*3/uL (ref 4.5–10.5)

## 2014-03-24 LAB — URINALYSIS, ROUTINE W REFLEX MICROSCOPIC
Bilirubin Urine: NEGATIVE
Hgb urine dipstick: NEGATIVE
Ketones, ur: NEGATIVE
Leukocytes, UA: NEGATIVE
Nitrite: NEGATIVE
RBC / HPF: NONE SEEN (ref 0–?)
Specific Gravity, Urine: >=1.03 — AB (ref 1.000–1.030)
Total Protein, Urine: NEGATIVE
Urine Glucose: NEGATIVE
Urobilinogen, UA: 0.2 (ref 0.0–1.0)
WBC, UA: NONE SEEN (ref 0–?)
pH: 5.5 (ref 5.0–8.0)

## 2014-03-24 LAB — HEPATIC FUNCTION PANEL
ALT: 14 U/L (ref 0–53)
AST: 18 U/L (ref 0–37)
Albumin: 3.8 g/dL (ref 3.5–5.2)
Alkaline Phosphatase: 50 U/L (ref 39–117)
Bilirubin, Direct: 0.1 mg/dL (ref 0.0–0.3)
Total Bilirubin: 0.5 mg/dL (ref 0.3–1.2)
Total Protein: 6.6 g/dL (ref 6.0–8.3)

## 2014-03-24 LAB — BASIC METABOLIC PANEL
BUN: 7 mg/dL (ref 6–23)
CO2: 26 mEq/L (ref 19–32)
Calcium: 9 mg/dL (ref 8.4–10.5)
Chloride: 106 mEq/L (ref 96–112)
Creatinine, Ser: 1.3 mg/dL (ref 0.4–1.5)
GFR: 73.98 mL/min (ref >60.00)
Glucose, Bld: 93 mg/dL (ref 70–99)
Potassium: 4.4 mEq/L (ref 3.5–5.1)
Sodium: 140 mEq/L (ref 135–145)

## 2014-03-24 LAB — TSH: TSH: 2.16 u[IU]/mL (ref 0.35–5.50)

## 2014-03-24 LAB — LIPID PANEL
Cholesterol: 168 mg/dL (ref 0–200)
HDL: 47.6 mg/dL (ref >39.00)
LDL Cholesterol: 97 mg/dL (ref 0–99)
Total CHOL/HDL Ratio: 4
Triglycerides: 118 mg/dL (ref 0.0–149.0)
VLDL: 23.6 mg/dL (ref 0.0–40.0)

## 2014-03-24 LAB — PSA: PSA: 0.66 ng/mL (ref 0.10–4.00)

## 2014-03-24 NOTE — Patient Instructions (Signed)
Please remember to call to set up your colonoscopy follow up exam  Please continue all other medications as before, and refills have been done if requested. Please have the pharmacy call with any other refills you may need.  Please continue your efforts at being more active, low cholesterol diet, and weight control. You are otherwise up to date with prevention measures today.  Please keep your appointments with your specialists as you may have planned  Please go to the LAB in the Basement (turn left off the elevator) for the tests to be done today You will be contacted by phone if any changes need to be made immediately.  Otherwise, you will receive a letter about your results with an explanation, but please check with MyChart first.  Please remember to sign up for MyChart if you have not done so, as this will be important to you in the future with finding out test results, communicating by private email, and scheduling acute appointments online when needed.  Please return in 1 year for your yearly visit, or sooner if needed

## 2014-03-24 NOTE — Assessment & Plan Note (Signed)

## 2014-03-24 NOTE — Progress Notes (Signed)
Pre visit review using our clinic review tool, if applicable. No additional management support is needed unless otherwise documented below in the visit note. 

## 2014-03-24 NOTE — Progress Notes (Signed)
Subjective:    Patient ID: Darrell Hoffman, male    DOB: 07-Sep-1951, 63 y.o.   MRN: [id number]  HPI  Here for wellness and f/u;  Overall doing ok;  Pt denies CP, worsening SOB, DOE, wheezing, orthopnea, PND, worsening LE edema, palpitations, dizziness or syncope.  Pt denies neurological change such as new headache, facial or extremity weakness.  Pt denies polydipsia, polyuria, or low sugar symptoms. Pt states overall good compliance with treatment and medications, good tolerability, and has been trying to follow lower cholesterol diet.  Pt denies worsening depressive symptoms, suicidal ideation or panic. No fever, night sweats, wt loss, loss of appetite, or other constitutional symptoms.  Pt states good ability with ADL's, has low fall risk, home safety reviewed and adequate, no other significant changes in hearing or vision, and only occasionally active with exercise.  Due for colonscopy, hesitates due to cost, will call on his own for setting this up per pt. 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 B12 DEFICIENCY 10/06/2008  . HYPERLIPIDEMIA 10/03/2007  . Morbid obesity 10/03/2007  . ANEMIA-IRON DEFICIENCY 11/27/2009  . ERECTILE DYSFUNCTION 10/03/2007  . HYPERTENSION 10/03/2007  . ALLERGIC RHINITIS 10/03/2007  . ASTHMA 10/03/2007  . GERD 10/03/2007  . KNEE PAIN, RIGHT 10/08/2007  . ALCOHOL ABUSE, HX OF 10/03/2007  . COLONIC POLYPS, HX OF 10/03/2007  . ANEMIA-NOS 10/08/2007  . ANEMIA-B12 DEFICIENCY 12/04/2009   Past Surgical History  Procedure Laterality Date  . Vasectomy    . Bilateral hydrocele repair    . S/p tkr  03/2009  . S/p left knee arthroscopy  2009    reports that he has never smoked. He does not have any smokeless tobacco history on file. He reports that he drinks alcohol. He reports that he uses illicit drugs. family  history includes Alcohol abuse in his paternal uncle; Breast cancer in his mother; Coronary artery disease in his other; Diabetes in his cousin and sister; Hypertension in his sister; Lupus in his sister. No Known Allergies Current Outpatient Prescriptions on File Prior to Visit  Medication Sig Dispense Refill  . ADVAIR DISKUS 500-50 MCG/DOSE AEPB INHALE 1 PUFF BY MOUTH TWICE DAILY  60 each  5  . albuterol (PROVENTIL HFA) 108 (90 BASE) MCG/ACT inhaler Inhale 2 puffs into the lungs every 4 (four) hours as needed.  3 Inhaler  1  . CIALIS 20 MG tablet TAKE 1 TABLET BY MOUTH DAILY AS NEEDED  5 tablet  11  . cyanocobalamin (,VITAMIN B-12,) 1000 MCG/ML injection Inject 1,000 mcg into the muscle. Inject 1 ML IM once per month       . omeprazole (PRILOSEC) 20 MG capsule TAKE 1 CAPSULE BY MOUTH TWICE DAILY  60 capsule  11  . omeprazole (PRILOSEC) 20 MG capsule TAKE 1 CAPSULE BY MOUTH TWICE DAILY  60 capsule  5  . fluticasone (FLONASE) 50 MCG/ACT nasal spray Place 2 sprays into the nose daily.  16 g  5   No current facility-administered medications on file prior to visit.   Review of Systems Constitutional: Negative for diaphoresis, activity change, appetite change or unexpected weight change.  HENT: Negative for hearing loss, ear pain, facial swelling, mouth sores and neck stiffness.   Eyes: Negative for pain, redness and visual disturbance.  Respiratory: Negative for shortness of breath and wheezing.   Cardiovascular: Negative  for chest pain and palpitations.  Gastrointestinal: Negative for diarrhea, blood in stool, abdominal distention or other pain Genitourinary: Negative for hematuria, flank pain or change in urine volume.  Musculoskeletal: Negative for myalgias and joint swelling.  Skin: Negative for color change and wound.  Neurological: Negative for syncope and numbness. other than noted Hematological: Negative for adenopathy.  Psychiatric/Behavioral: Negative for hallucinations, self-injury,  decreased concentration and agitation.      Objective:   Physical Exam BP 122/82  Pulse 88  Temp(Src) 99 F (37.2 C) (Oral)  Ht 5' 6.5" (1.689 m)  Wt 213 lb (96.616 kg)  BMI 33.87 kg/m2  SpO2 98% VS noted,  Constitutional: Pt is oriented to person, place, and time. Appears well-developed and well-nourished.  Head: Normocephalic and atraumatic.  Right Ear: External ear normal.  Left Ear: External ear normal.  Nose: Nose normal.  Mouth/Throat: Oropharynx is clear and moist.  Eyes: Conjunctivae and EOM are normal. Pupils are equal, round, and reactive to light.  Neck: Normal range of motion. Neck supple. No JVD present. No tracheal deviation present.  Cardiovascular: Normal rate, regular rhythm, normal heart sounds and intact distal pulses.   Pulmonary/Chest: Effort normal and breath sounds normal.  Abdominal: Soft. Bowel sounds are normal. There is no tenderness. No HSM  Musculoskeletal: Normal range of motion. Exhibits no edema.  Lymphadenopathy:  Has no cervical adenopathy.  Neurological: Pt is alert and oriented to person, place, and time. Pt has normal reflexes. No cranial nerve deficit.  Skin: Skin is warm and dry. No rash noted.  Psychiatric:  Has  normal mood and affect. Behavior is normal.      Assessment & Plan:

## 2014-05-25 ENCOUNTER — Other Ambulatory Visit: Payer: Self-pay | Admitting: Internal Medicine

## 2014-07-03 ENCOUNTER — Other Ambulatory Visit: Payer: Self-pay | Admitting: Internal Medicine

## 2014-08-05 ENCOUNTER — Encounter: Payer: Self-pay | Admitting: Gastroenterology

## 2015-03-01 ENCOUNTER — Other Ambulatory Visit: Payer: Self-pay | Admitting: Internal Medicine

## 2015-03-22 ENCOUNTER — Encounter: Payer: Self-pay | Admitting: Internal Medicine

## 2015-03-22 DIAGNOSIS — Z0000 Encounter for general adult medical examination without abnormal findings: Secondary | ICD-10-CM

## 2015-03-28 ENCOUNTER — Other Ambulatory Visit (INDEPENDENT_AMBULATORY_CARE_PROVIDER_SITE_OTHER): Payer: Federal, State, Local not specified - PPO

## 2015-03-28 ENCOUNTER — Encounter: Payer: Self-pay | Admitting: Internal Medicine

## 2015-03-28 ENCOUNTER — Ambulatory Visit (INDEPENDENT_AMBULATORY_CARE_PROVIDER_SITE_OTHER): Payer: Federal, State, Local not specified - PPO | Admitting: Internal Medicine

## 2015-03-28 VITALS — BP 130/80 | HR 74 | Temp 98.8°F | Resp 18 | Ht 67.0 in | Wt 215.1 lb

## 2015-03-28 DIAGNOSIS — Z0000 Encounter for general adult medical examination without abnormal findings: Secondary | ICD-10-CM

## 2015-03-28 DIAGNOSIS — R7989 Other specified abnormal findings of blood chemistry: Secondary | ICD-10-CM

## 2015-03-28 LAB — CBC WITH DIFFERENTIAL/PLATELET
Basophils Absolute: 0 10*3/uL (ref 0.0–0.1)
Basophils Relative: 0.3 % (ref 0.0–3.0)
Eosinophils Absolute: 0.1 10*3/uL (ref 0.0–0.7)
Eosinophils Relative: 1.5 % (ref 0.0–5.0)
HCT: 34.8 % — ABNORMAL LOW (ref 39.0–52.0)
Hemoglobin: 11.6 g/dL — ABNORMAL LOW (ref 13.0–17.0)
Lymphocytes Relative: 37.1 % (ref 12.0–46.0)
Lymphs Abs: 2.8 10*3/uL (ref 0.7–4.0)
MCHC: 33.3 g/dL (ref 30.0–36.0)
MCV: 81.5 fl (ref 78.0–100.0)
Monocytes Absolute: 0.4 10*3/uL (ref 0.1–1.0)
Monocytes Relative: 5.8 % (ref 3.0–12.0)
Neutro Abs: 4.2 10*3/uL (ref 1.4–7.7)
Neutrophils Relative %: 55.3 % (ref 43.0–77.0)
Platelets: 224 10*3/uL (ref 150.0–400.0)
RBC: 4.27 Mil/uL (ref 4.22–5.81)
RDW: 15 % (ref 11.5–15.5)
WBC: 7.7 10*3/uL (ref 4.0–10.5)

## 2015-03-28 LAB — LIPID PANEL
Cholesterol: 165 mg/dL (ref 0–200)
HDL: 37.9 mg/dL — ABNORMAL LOW (ref >39.00)
NonHDL: 127.1
Total CHOL/HDL Ratio: 4
Triglycerides: 232 mg/dL — ABNORMAL HIGH (ref 0.0–149.0)
VLDL: 46.4 mg/dL — ABNORMAL HIGH (ref 0.0–40.0)

## 2015-03-28 LAB — TSH: TSH: 2.48 u[IU]/mL (ref 0.35–4.50)

## 2015-03-28 LAB — BASIC METABOLIC PANEL
BUN: 9 mg/dL (ref 6–23)
CO2: 31 mEq/L (ref 19–32)
Calcium: 9.3 mg/dL (ref 8.4–10.5)
Chloride: 107 mEq/L (ref 96–112)
Creatinine, Ser: 1.46 mg/dL (ref 0.40–1.50)
GFR: 62.78 mL/min (ref >60.00)
Glucose, Bld: 108 mg/dL — ABNORMAL HIGH (ref 70–99)
Potassium: 4.3 mEq/L (ref 3.5–5.1)
Sodium: 143 mEq/L (ref 135–145)

## 2015-03-28 LAB — URINALYSIS, ROUTINE W REFLEX MICROSCOPIC
Bilirubin Urine: NEGATIVE
Hgb urine dipstick: NEGATIVE
Ketones, ur: NEGATIVE
Leukocytes, UA: NEGATIVE
Nitrite: NEGATIVE
RBC / HPF: NONE SEEN (ref 0–?)
Specific Gravity, Urine: 1.025 (ref 1.000–1.030)
Urine Glucose: NEGATIVE
Urobilinogen, UA: 1 (ref 0.0–1.0)
pH: 6 (ref 5.0–8.0)

## 2015-03-28 LAB — HEPATIC FUNCTION PANEL
ALT: 13 U/L (ref 0–53)
AST: 18 U/L (ref 0–37)
Albumin: 4.2 g/dL (ref 3.5–5.2)
Alkaline Phosphatase: 63 U/L (ref 39–117)
Bilirubin, Direct: 0.1 mg/dL (ref 0.0–0.3)
Total Bilirubin: 0.4 mg/dL (ref 0.2–1.2)
Total Protein: 7.1 g/dL (ref 6.0–8.3)

## 2015-03-28 LAB — PSA: PSA: 0.6 ng/mL (ref 0.10–4.00)

## 2015-03-28 LAB — LDL CHOLESTEROL, DIRECT: Direct LDL: 88 mg/dL

## 2015-03-28 NOTE — Assessment & Plan Note (Signed)

## 2015-03-28 NOTE — Progress Notes (Signed)
Subjective:    Patient ID: Darrell Hoffman, male    DOB: 1951/10/05, 64 y.o.   MRN: [id number]  HPI  Here for wellness and f/u;  Overall doing ok;  Pt denies Chest pain, worsening SOB, DOE, wheezing, orthopnea, PND, worsening LE edema, palpitations, dizziness or syncope.  Pt denies neurological change such as new headache, facial or extremity weakness.  Pt denies polydipsia, polyuria, or low sugar symptoms. Pt states overall good compliance with treatment and medications, good tolerability, and has been trying to follow appropriate diet.  Pt denies worsening depressive symptoms, suicidal ideation or panic. No fever, night sweats, wt loss, loss of appetite, or other constitutional symptoms.  Pt states good ability with ADL's, has low fall risk, home safety reviewed and adequate, no other significant changes in hearing or vision, and only occasionally active with exercise. Hard to lose wt Past Medical History  Diagnosis Date  . B12 DEFICIENCY 10/06/2008  . HYPERLIPIDEMIA 10/03/2007  . Morbid obesity 10/03/2007  . ANEMIA-IRON DEFICIENCY 11/27/2009  . ERECTILE DYSFUNCTION 10/03/2007  . HYPERTENSION 10/03/2007  . ALLERGIC RHINITIS 10/03/2007  . ASTHMA 10/03/2007  . GERD 10/03/2007  . KNEE PAIN, RIGHT 10/08/2007  . ALCOHOL ABUSE, HX OF 10/03/2007  . COLONIC POLYPS, HX OF 10/03/2007  . ANEMIA-NOS 10/08/2007  . ANEMIA-B12 DEFICIENCY 12/04/2009   Past Surgical History  Procedure Laterality Date  . Vasectomy    . Bilateral hydrocele repair    . S/p tkr  03/2009  . S/p left knee arthroscopy  2009    reports that he has never smoked. He does not have any smokeless tobacco history on file. He reports that he drinks alcohol. He reports that he uses illicit drugs. family history includes Alcohol abuse in his paternal uncle; Breast cancer in his mother; Coronary artery disease in his other; Diabetes in his cousin and sister; Hypertension in his sister; Lupus in his sister. No Known Allergies Current  Outpatient Prescriptions on File Prior to Visit  Medication Sig Dispense Refill  . ADVAIR DISKUS 500-50 MCG/DOSE AEPB INHALE 1 PUFF BY MOUTH TWICE DAILY 60 each 2  . albuterol (PROVENTIL HFA) 108 (90 BASE) MCG/ACT inhaler Inhale 2 puffs into the lungs every 4 (four) hours as needed. 3 Inhaler 1  . CIALIS 20 MG tablet TAKE 1 TABLET BY MOUTH DAILY AS NEEDED 5 tablet 11  . cyanocobalamin (,VITAMIN B-12,) 1000 MCG/ML injection Inject 1,000 mcg into the muscle. Inject 1 ML IM once per month     . omeprazole (PRILOSEC) 20 MG capsule TAKE 1 CAPSULE BY MOUTH TWICE DAILY 60 capsule 5  . omeprazole (PRILOSEC) 20 MG capsule TAKE ONE CAPSULE BY MOUTH TWICE DAILY 60 capsule 11  . fluticasone (FLONASE) 50 MCG/ACT nasal spray Place 2 sprays into the nose daily. 16 g 5   No current facility-administered medications on file prior to visit.   Review of Systems Constitutional: Negative for increased diaphoresis, other activity, appetite or siginficant weight change other than noted HENT: Negative for worsening hearing loss, ear pain, facial swelling, mouth sores and neck stiffness.   Eyes: Negative for other worsening pain, redness or visual disturbance.  Respiratory: Negative for shortness of breath and wheezing  Cardiovascular: Negative for chest pain and palpitations.  Gastrointestinal: Negative for diarrhea, blood in stool, abdominal distention or other pain Genitourinary: Negative for hematuria, flank pain or change in urine volume.  Musculoskeletal: Negative for myalgias or other joint complaints.  Skin: Negative for color change and wound or drainage.  Neurological: Negative  for syncope and numbness. other than noted Hematological: Negative for adenopathy. or other swelling Psychiatric/Behavioral: Negative for hallucinations, SI, self-injury, decreased concentration or other worsening agitation.      Objective:   Physical Exam BP 130/80 mmHg  Pulse 74  Temp(Src) 98.8 F (37.1 C) (Oral)  Resp 18   Ht 5\' 7"  (1.702 m)  Wt 215 lb 1.3 oz (97.56 kg)  BMI 33.68 kg/m2  SpO2 99% BP Readings from Last 3 Encounters:  03/28/15 130/80  03/24/14 122/82  03/22/13 130/80   Wt Readings from Last 3 Encounters:  03/28/15 215 lb 1.3 oz (97.56 kg)  03/24/14 213 lb (96.616 kg)  03/22/13 215 lb 6 oz (97.693 kg)  VS noted,  Constitutional: Pt is oriented to person, place, and time. Appears well-developed and well-nourished, in no significant distress Head: Normocephalic and atraumatic.  Right Ear: External ear normal.  Left Ear: External ear normal.  Nose: Nose normal.  Mouth/Throat: Oropharynx is clear and moist.  Eyes: Conjunctivae and EOM are normal. Pupils are equal, round, and reactive to light.  Neck: Normal range of motion. Neck supple. No JVD present. No tracheal deviation present or significant neck LA or mass Cardiovascular: Normal rate, regular rhythm, normal heart sounds and intact distal pulses.   Pulmonary/Chest: Effort normal and breath sounds without rales or wheezing  Abdominal: Soft. Bowel sounds are normal. NT. No HSM  Musculoskeletal: Normal range of motion. Exhibits no edema.  Lymphadenopathy:  Has no cervical adenopathy.  Neurological: Pt is alert and oriented to person, place, and time. Pt has normal reflexes. No cranial nerve deficit. Motor grossly intact Skin: Skin is warm and dry. No rash noted.  Psychiatric:  Has normal mood and affect. Behavior is normal.      Assessment & Plan:

## 2015-03-28 NOTE — Patient Instructions (Signed)

## 2015-03-28 NOTE — Progress Notes (Signed)
Pre visit review using our clinic review tool, if applicable. No additional management support is needed unless otherwise documented below in the visit note. 

## 2015-06-08 ENCOUNTER — Other Ambulatory Visit: Payer: Self-pay | Admitting: Internal Medicine

## 2015-12-04 ENCOUNTER — Other Ambulatory Visit: Payer: Self-pay | Admitting: Internal Medicine

## 2016-01-10 ENCOUNTER — Encounter: Payer: Self-pay | Admitting: Internal Medicine

## 2016-01-10 ENCOUNTER — Ambulatory Visit (INDEPENDENT_AMBULATORY_CARE_PROVIDER_SITE_OTHER): Payer: Federal, State, Local not specified - PPO | Admitting: Internal Medicine

## 2016-01-10 VITALS — BP 138/84 | HR 73 | Temp 98.4°F | Resp 20 | Wt 217.0 lb

## 2016-01-10 DIAGNOSIS — R229 Localized swelling, mass and lump, unspecified: Secondary | ICD-10-CM

## 2016-01-10 DIAGNOSIS — Z0000 Encounter for general adult medical examination without abnormal findings: Secondary | ICD-10-CM

## 2016-01-10 DIAGNOSIS — J4520 Mild intermittent asthma, uncomplicated: Secondary | ICD-10-CM

## 2016-01-10 DIAGNOSIS — I10 Essential (primary) hypertension: Secondary | ICD-10-CM

## 2016-01-10 DIAGNOSIS — Z23 Encounter for immunization: Secondary | ICD-10-CM

## 2016-01-10 NOTE — Assessment & Plan Note (Signed)
stable overall by history and exam, recent data reviewed with pt, and pt to continue medical treatment as before,  to f/u any worsening symptoms or concerns SpO2 Readings from Last 3 Encounters:  01/10/16 98%  03/28/15 99%  03/24/14 98%

## 2016-01-10 NOTE — Assessment & Plan Note (Signed)
D/w pt, likely benign, ok to follow, for gen surgury referral if any worsening pain or size or other LA

## 2016-01-10 NOTE — Progress Notes (Signed)
Subjective:    Patient ID: Darrell Hoffman, male    DOB: 12-Jul-1951, 65 y.o.   MRN: [id number]  HPI  Here with 2 lumps subq to the abdomen, both approx 1.5 to 2 cm prox to the umbilicus, with the first being larger , ongoing for 1 yr, without pain or increased size, no fever, wt loss or skin changes or drainage, second is at similar dermatome on the left , less than 1/2 the size of the other, new, tender, mobile again without fever, wt loss or skin change or drainage.  No other known lumps.  Pt denies chest pain, increased sob or doe, wheezing, orthopnea, PND, increased LE swelling, palpitations, dizziness or syncope.  Denies worsening reflux, abd pain, dysphagia, n/v, bowel change or blood.   Pt denies fever, wt loss, night sweats, loss of appetite, or other constitutional symptoms Past Medical History  Diagnosis Date  . B12 DEFICIENCY 10/06/2008  . HYPERLIPIDEMIA 10/03/2007  . Morbid obesity ([place]) 10/03/2007  . ANEMIA-IRON DEFICIENCY 11/27/2009  . ERECTILE DYSFUNCTION 10/03/2007  . HYPERTENSION 10/03/2007  . ALLERGIC RHINITIS 10/03/2007  . ASTHMA 10/03/2007  . GERD 10/03/2007  . KNEE PAIN, RIGHT 10/08/2007  . ALCOHOL ABUSE, HX OF 10/03/2007  . COLONIC POLYPS, HX OF 10/03/2007  . ANEMIA-NOS 10/08/2007  . ANEMIA-B12 DEFICIENCY 12/04/2009   Past Surgical History  Procedure Laterality Date  . Vasectomy    . Bilateral hydrocele repair    . S/p tkr  03/2009  . S/p left knee arthroscopy  2009    reports that he has never smoked. He does not have any smokeless tobacco history on file. He reports that he drinks alcohol. He reports that he uses illicit drugs. family history includes Alcohol abuse in his paternal uncle; Breast cancer in his mother; Coronary artery disease in his other; Diabetes in his cousin and sister; Hypertension in his sister; Lupus in his sister. No Known Allergies Current Outpatient Prescriptions on File Prior to Visit  Medication Sig Dispense Refill  . ADVAIR DISKUS  500-50 MCG/DOSE AEPB INHALE 1 PUFF BY MOUTH TWICE DAILY 1 each 1  . albuterol (PROVENTIL HFA) 108 (90 BASE) MCG/ACT inhaler Inhale 2 puffs into the lungs every 4 (four) hours as needed. 3 Inhaler 1  . CIALIS 20 MG tablet TAKE 1 TABLET BY MOUTH DAILY AS NEEDED 5 tablet 11  . cyanocobalamin (,VITAMIN B-12,) 1000 MCG/ML injection Inject 1,000 mcg into the muscle. Inject 1 ML IM once per month     . omeprazole (PRILOSEC) 20 MG capsule TAKE 1 CAPSULE BY MOUTH TWICE DAILY 60 capsule 5  . omeprazole (PRILOSEC) 20 MG capsule TAKE 1 CAPSULE BY MOUTH TWICE DAILY 60 capsule 11  . fluticasone (FLONASE) 50 MCG/ACT nasal spray Place 2 sprays into the nose daily. 16 g 5   No current facility-administered medications on file prior to visit.   Review of Systems All otherwise neg per pt     Objective:   Physical Exam BP 138/84 mmHg  Pulse 73  Temp(Src) 98.4 F (36.9 C) (Oral)  Resp 20  Wt 217 lb (98.431 kg)  SpO2 98% VS noted,  Constitutional: Pt appears in no significant distress HENT: Head: NCAT.  Right Ear: External ear normal.  Left Ear: External ear normal.  Eyes: . Pupils are equal, round, and reactive to light. Conjunctivae and EOM are normal Neck: Normal range of motion. Neck supple.  Cardiovascular: Normal rate and regular rhythm.   Pulmonary/Chest: Effort normal and breath sounds without rales or  wheezing.  Abd:  Soft, NT, ND, + BS Neurological: Pt is alert. Not confused , motor grossly intact Skin: Skin is warm. No rash, no LE edema; right paramedian subq nodule approx 1.5 x 1.5 cm mobile, NT, no skin change;  Left subq nodule noted mid clavicular line at approx same dermatomal level, only .5 cm x .5 cm, mobile but somewhat tender; no overlyging skin changes or rash or drainage Psychiatric: Pt behavior is normal. No agitation.     Assessment & Plan:

## 2016-01-10 NOTE — Patient Instructions (Signed)
You had the flu shot today  Please continue all other medications as before, and refills have been done if requested.  Please have the pharmacy call with any other refills you may need.  Please keep your appointments with your specialists as you may have planned   

## 2016-01-10 NOTE — Assessment & Plan Note (Signed)
stable overall by history and exam, recent data reviewed with pt, and pt to continue medical treatment as before,  to f/u any worsening symptoms or concerns BP Readings from Last 3 Encounters:  01/10/16 138/84  03/28/15 130/80  03/24/14 122/82

## 2016-01-10 NOTE — Progress Notes (Signed)
Pre visit review using our clinic review tool, if applicable. No additional management support is needed unless otherwise documented below in the visit note. 

## 2016-02-09 ENCOUNTER — Other Ambulatory Visit: Payer: Self-pay | Admitting: Internal Medicine

## 2016-03-10 ENCOUNTER — Other Ambulatory Visit: Payer: Self-pay | Admitting: Internal Medicine

## 2016-03-27 ENCOUNTER — Other Ambulatory Visit (INDEPENDENT_AMBULATORY_CARE_PROVIDER_SITE_OTHER): Payer: Federal, State, Local not specified - PPO

## 2016-03-27 ENCOUNTER — Ambulatory Visit (INDEPENDENT_AMBULATORY_CARE_PROVIDER_SITE_OTHER): Payer: Federal, State, Local not specified - PPO | Admitting: Internal Medicine

## 2016-03-27 VITALS — BP 140/80 | HR 88 | Temp 98.0°F | Resp 20 | Wt 216.0 lb

## 2016-03-27 DIAGNOSIS — Z0001 Encounter for general adult medical examination with abnormal findings: Secondary | ICD-10-CM

## 2016-03-27 DIAGNOSIS — Z860100 Personal history of colon polyps, unspecified: Secondary | ICD-10-CM | POA: Insufficient documentation

## 2016-03-27 DIAGNOSIS — J4520 Mild intermittent asthma, uncomplicated: Secondary | ICD-10-CM

## 2016-03-27 DIAGNOSIS — I10 Essential (primary) hypertension: Secondary | ICD-10-CM

## 2016-03-27 DIAGNOSIS — K219 Gastro-esophageal reflux disease without esophagitis: Secondary | ICD-10-CM

## 2016-03-27 DIAGNOSIS — L84 Corns and callosities: Secondary | ICD-10-CM | POA: Diagnosis not present

## 2016-03-27 DIAGNOSIS — Z0000 Encounter for general adult medical examination without abnormal findings: Secondary | ICD-10-CM

## 2016-03-27 DIAGNOSIS — E785 Hyperlipidemia, unspecified: Secondary | ICD-10-CM

## 2016-03-27 DIAGNOSIS — R6889 Other general symptoms and signs: Secondary | ICD-10-CM

## 2016-03-27 LAB — URINALYSIS, ROUTINE W REFLEX MICROSCOPIC
Bilirubin Urine: NEGATIVE
Hgb urine dipstick: NEGATIVE
Ketones, ur: NEGATIVE
Leukocytes, UA: NEGATIVE
Nitrite: NEGATIVE
RBC / HPF: NONE SEEN (ref 0–?)
Specific Gravity, Urine: 1.02 (ref 1.000–1.030)
Urine Glucose: NEGATIVE
Urobilinogen, UA: 0.2 (ref 0.0–1.0)
WBC, UA: NONE SEEN (ref 0–?)
pH: 6.5 (ref 5.0–8.0)

## 2016-03-27 LAB — CBC WITH DIFFERENTIAL/PLATELET
Basophils Absolute: 0 10*3/uL (ref 0.0–0.1)
Basophils Relative: 0.2 % (ref 0.0–3.0)
Eosinophils Absolute: 0.1 10*3/uL (ref 0.0–0.7)
Eosinophils Relative: 1.6 % (ref 0.0–5.0)
HCT: 35.6 % — ABNORMAL LOW (ref 39.0–52.0)
Hemoglobin: 11.8 g/dL — ABNORMAL LOW (ref 13.0–17.0)
Lymphocytes Relative: 27.2 % (ref 12.0–46.0)
Lymphs Abs: 2.2 10*3/uL (ref 0.7–4.0)
MCHC: 33.2 g/dL (ref 30.0–36.0)
MCV: 82 fl (ref 78.0–100.0)
Monocytes Absolute: 0.5 10*3/uL (ref 0.1–1.0)
Monocytes Relative: 6 % (ref 3.0–12.0)
Neutro Abs: 5.3 10*3/uL (ref 1.4–7.7)
Neutrophils Relative %: 65 % (ref 43.0–77.0)
Platelets: 227 10*3/uL (ref 150.0–400.0)
RBC: 4.34 Mil/uL (ref 4.22–5.81)
RDW: 15 % (ref 11.5–15.5)
WBC: 8.1 10*3/uL (ref 4.0–10.5)

## 2016-03-27 LAB — HEPATIC FUNCTION PANEL
ALT: 23 U/L (ref 0–53)
AST: 28 U/L (ref 0–37)
Albumin: 4.4 g/dL (ref 3.5–5.2)
Alkaline Phosphatase: 60 U/L (ref 39–117)
Bilirubin, Direct: 0.1 mg/dL (ref 0.0–0.3)
Total Bilirubin: 0.7 mg/dL (ref 0.2–1.2)
Total Protein: 7.2 g/dL (ref 6.0–8.3)

## 2016-03-27 LAB — LIPID PANEL
Cholesterol: 164 mg/dL (ref 0–200)
HDL: 39.1 mg/dL (ref >39.00)
LDL Cholesterol: 94 mg/dL (ref 0–99)
NonHDL: 125.16
Total CHOL/HDL Ratio: 4
Triglycerides: 154 mg/dL — ABNORMAL HIGH (ref 0.0–149.0)
VLDL: 30.8 mg/dL (ref 0.0–40.0)

## 2016-03-27 LAB — BASIC METABOLIC PANEL
BUN: 8 mg/dL (ref 6–23)
CO2: 30 mEq/L (ref 19–32)
Calcium: 9.4 mg/dL (ref 8.4–10.5)
Chloride: 106 mEq/L (ref 96–112)
Creatinine, Ser: 1.56 mg/dL — ABNORMAL HIGH (ref 0.40–1.50)
GFR: 57.97 mL/min — ABNORMAL LOW (ref >60.00)
Glucose, Bld: 102 mg/dL — ABNORMAL HIGH (ref 70–99)
Potassium: 4.7 mEq/L (ref 3.5–5.1)
Sodium: 142 mEq/L (ref 135–145)

## 2016-03-27 LAB — PSA: PSA: 0.59 ng/mL (ref 0.10–4.00)

## 2016-03-27 LAB — TSH: TSH: 1.82 u[IU]/mL (ref 0.35–4.50)

## 2016-03-27 MED ORDER — FLUTICASONE-SALMETEROL 500-50 MCG/DOSE IN AEPB: 1.0000 | INHALATION_SPRAY | Freq: Two times a day (BID) | RESPIRATORY_TRACT | Status: DC

## 2016-03-27 MED ORDER — ASPIRIN EC 81 MG PO TBEC: 81.0000 mg | DELAYED_RELEASE_TABLET | Freq: Every day | ORAL | Status: DC

## 2016-03-27 NOTE — Assessment & Plan Note (Signed)
stable overall by history and exam, recent data reviewed with pt, and pt to continue medical treatment as before,  to f/u any worsening symptoms or concerns  Lab Results  Component Value Date   LDLCALC 97 03/24/2014

## 2016-03-27 NOTE — Progress Notes (Signed)
Subjective:    Patient ID: Darrell Hoffman, male    DOB: 1951-07-16, 65 y.o.   MRN: [id number]  HPI Here for wellness and f/u;  Overall doing ok;  Pt denies Chest pain, worsening SOB, DOE, wheezing, orthopnea, PND, worsening LE edema, palpitations, dizziness or syncope.  Pt denies neurological change such as new headache, facial or extremity weakness.  Pt denies polydipsia, polyuria, or low sugar symptoms. Pt states overall good compliance with treatment and medications, good tolerability, and has been trying to follow appropriate diet.  Pt denies worsening depressive symptoms, suicidal ideation or panic. No fever, night sweats, wt loss, loss of appetite, or other constitutional symptoms.  Pt states good ability with ADL's, has low fall risk, home safety reviewed and adequate, no other significant changes in hearing or vision, and only occasionally active with exercise.  Does C/o foot soreness and callouses on the feet to several areas. Past Medical History  Diagnosis Date  . B12 DEFICIENCY 10/06/2008  . HYPERLIPIDEMIA 10/03/2007  . Morbid obesity ([place]) 10/03/2007  . ANEMIA-IRON DEFICIENCY 11/27/2009  . ERECTILE DYSFUNCTION 10/03/2007  . HYPERTENSION 10/03/2007  . ALLERGIC RHINITIS 10/03/2007  . ASTHMA 10/03/2007  . GERD 10/03/2007  . KNEE PAIN, RIGHT 10/08/2007  . ALCOHOL ABUSE, HX OF 10/03/2007  . COLONIC POLYPS, HX OF 10/03/2007  . ANEMIA-NOS 10/08/2007  . ANEMIA-B12 DEFICIENCY 12/04/2009   Past Surgical History  Procedure Laterality Date  . Vasectomy    . Bilateral hydrocele repair    . S/p tkr  03/2009  . S/p left knee arthroscopy  2009    reports that he has never smoked. He does not have any smokeless tobacco history on file. He reports that he drinks alcohol. He reports that he uses illicit drugs. family history includes Alcohol abuse in his paternal uncle; Breast cancer in his mother; Coronary artery disease in his other; Diabetes in his cousin and sister; Hypertension in his  sister; Lupus in his sister. No Known Allergies Current Outpatient Prescriptions on File Prior to Visit  Medication Sig Dispense Refill  . albuterol (PROVENTIL HFA) 108 (90 BASE) MCG/ACT inhaler Inhale 2 puffs into the lungs every 4 (four) hours as needed. 3 Inhaler 1  . CIALIS 20 MG tablet TAKE 1 TABLET BY MOUTH DAILY AS NEEDED 5 tablet 11  . cyanocobalamin (,VITAMIN B-12,) 1000 MCG/ML injection Inject 1,000 mcg into the muscle. Inject 1 ML IM once per month     . omeprazole (PRILOSEC) 20 MG capsule TAKE 1 CAPSULE BY MOUTH TWICE DAILY 60 capsule 11  . fluticasone (FLONASE) 50 MCG/ACT nasal spray Place 2 sprays into the nose daily. 16 g 5   No current facility-administered medications on file prior to visit.   Review of Systems Constitutional: Negative for increased diaphoresis, or other activity, appetite or siginficant weight change other than noted HENT: Negative for worsening hearing loss, ear pain, facial swelling, mouth sores and neck stiffness.   Eyes: Negative for other worsening pain, redness or visual disturbance.  Respiratory: Negative for choking or stridor Cardiovascular: Negative for other chest pain and palpitations.  Gastrointestinal: Negative for worsening diarrhea, blood in stool, or abdominal distention Genitourinary: Negative for hematuria, flank pain or change in urine volume.  Musculoskeletal: Negative for myalgias or other joint complaints.  Skin: Negative for other color change and wound or drainage.  Neurological: Negative for syncope and numbness. other than noted Hematological: Negative for adenopathy. or other swelling Psychiatric/Behavioral: Negative for hallucinations, SI, self-injury, decreased concentration or other worsening agitation.  Objective:   Physical Exam BP 140/80 mmHg  Pulse 88  Temp(Src) 98 F (36.7 C) (Oral)  Resp 20  Wt 216 lb (97.977 kg)  SpO2 90% VS noted,  Constitutional: Pt is oriented to person, place, and time. Appears  well-developed and well-nourished, in no significant distress Head: Normocephalic and atraumatic  Eyes: Conjunctivae and EOM are normal. Pupils are equal, round, and reactive to light Right Ear: External ear normal.  Left Ear: External ear normal Nose: Nose normal.  Mouth/Throat: Oropharynx is clear and moist  Neck: Normal range of motion. Neck supple. No JVD present. No tracheal deviation present or significant neck LA or mass Cardiovascular: Normal rate, regular rhythm, normal heart sounds and intact distal pulses.   Pulmonary/Chest: Effort normal and breath sounds without rales but with mild scattered wheezing apparently asymptomatic  Abdominal: Soft. Bowel sounds are normal. NT. No HSM  Musculoskeletal: Normal range of motion. Exhibits no edema Lymphadenopathy: Has no cervical adenopathy.  Neurological: Pt is alert and oriented to person, place, and time. Pt has normal reflexes. No cranial nerve deficit. Motor grossly intact Skin: Skin is warm and dry. No rash noted or new ulcers, several callouses to heels noted bilat feet Psychiatric:  Has normal mood and affect. Behavior is normal.   ECG reviewed - NSR with NSSTT wave changes    Assessment & Plan:

## 2016-03-27 NOTE — Assessment & Plan Note (Signed)
stable overall by history and exam, recent data reviewed with pt, and pt to continue medical treatment as before,  to f/u any worsening symptoms or concerns BP Readings from Last 3 Encounters:  03/27/16 140/80  01/10/16 138/84  03/28/15 130/80

## 2016-03-27 NOTE — Assessment & Plan Note (Addendum)
suboptimal controlled by exam, has rescue inhaler he does not use, to cont adviar, consider trial change to symbicort if covered by insurance  In addition to the time spent performing CPE, I spent an additional 40 minutes face to face,in which greater than 50% of this time was spent in counseling and coordination of care for patient's acute illness as documented.

## 2016-03-27 NOTE — Assessment & Plan Note (Signed)
Mild to mod, for podaitry referral,  to f/u any worsening symptoms or concerns

## 2016-03-27 NOTE — Assessment & Plan Note (Signed)

## 2016-03-27 NOTE — Progress Notes (Signed)
Pre visit review using our clinic review tool, if applicable. No additional management support is needed unless otherwise documented below in the visit note. 

## 2016-03-27 NOTE — Patient Instructions (Addendum)
Please take all new medication as prescribed - the Aspirin 81 gm (coated only) - 1 per day to reduce risk of stroke and heart attaack  Please continue all other medications as before, and refills have been done if requested - the omeprazole and Advair  Please have the pharmacy call with any other refills you may need.  Please continue your efforts at being more active, low cholesterol diet, and weight control.  You are otherwise up to date with prevention measures today.  You will be contacted regarding the referral for: colonoscopy, and podiatry  Please keep your appointments with your specialists as you may have planned  Please go to the LAB in the Basement (turn left off the elevator) for the tests to be done today  You will be contacted by phone if any changes need to be made immediately.  Otherwise, you will receive a letter about your results with an explanation, but please check with MyChart first.  Please remember to sign up for MyChart if you have not done so, as this will be important to you in the future with finding out test results, communicating by private email, and scheduling acute appointments online when needed.  Please return in 1 year for your yearly visit, or sooner if needed, with Lab testing done 3-5 days before

## 2016-03-27 NOTE — Assessment & Plan Note (Signed)
Overdue for f/u colonoscopy, d/w pt, agrees for f/u referral

## 2016-03-27 NOTE — Assessment & Plan Note (Signed)
stable overall by history and exam, and pt to continue medical treatment as before,  to f/u any worsening symptoms or concerns,  

## 2016-04-03 ENCOUNTER — Encounter: Payer: Self-pay | Admitting: Gastroenterology

## 2016-04-10 ENCOUNTER — Other Ambulatory Visit: Payer: Self-pay | Admitting: *Deleted

## 2016-04-10 MED ORDER — FLUTICASONE PROPIONATE 50 MCG/ACT NA SUSP
2.0000 | Freq: Every day | NASAL | Status: DC
Start: 2016-04-10 — End: 2017-03-27

## 2016-04-10 NOTE — Telephone Encounter (Signed)
Requesting refill on flonase. Rx sent to [facility],,/[facility]

## 2016-04-22 ENCOUNTER — Ambulatory Visit (INDEPENDENT_AMBULATORY_CARE_PROVIDER_SITE_OTHER): Payer: Federal, State, Local not specified - PPO | Admitting: Podiatry

## 2016-04-22 ENCOUNTER — Ambulatory Visit (INDEPENDENT_AMBULATORY_CARE_PROVIDER_SITE_OTHER): Payer: Federal, State, Local not specified - PPO

## 2016-04-22 ENCOUNTER — Encounter: Payer: Self-pay | Admitting: Podiatry

## 2016-04-22 VITALS — BP 148/88 | HR 59 | Resp 16 | Ht 66.0 in | Wt 226.0 lb

## 2016-04-22 DIAGNOSIS — M79673 Pain in unspecified foot: Secondary | ICD-10-CM

## 2016-04-22 DIAGNOSIS — L84 Corns and callosities: Secondary | ICD-10-CM | POA: Diagnosis not present

## 2016-04-22 DIAGNOSIS — M779 Enthesopathy, unspecified: Secondary | ICD-10-CM | POA: Diagnosis not present

## 2016-04-22 DIAGNOSIS — M216X9 Other acquired deformities of unspecified foot: Secondary | ICD-10-CM

## 2016-04-22 MED ORDER — TRIAMCINOLONE ACETONIDE 10 MG/ML IJ SUSP
10.0000 mg | Freq: Once | INTRAMUSCULAR | Status: AC
  Administered 2016-04-22: 10 mg

## 2016-04-22 NOTE — Progress Notes (Signed)
Subjective:     Patient ID: Darrell Hoffman, male   DOB: 02-Jun-1951, 65 y.o.   MRN: [id number]  HPI patient states I have pain under the fifth metatarsals of both feet with left being worse and also under the left lateral heel with thickness. I have a family history of callus formation and it's been present  for a number of years   Review of Systems  All other systems reviewed and are negative.      Objective:   Physical Exam  Constitutional: He is oriented to person, place, and time.  Cardiovascular: Intact distal pulses.   Musculoskeletal: Normal range of motion.  Neurological: He is oriented to person, place, and time.  Skin: Skin is warm.  Nursing note and vitals reviewed.  neurovascular status intact muscle strength adequate range of motion within normal limits with patient found to have inflammatory callus formation plantar aspect fifth metatarsal head bilateral left over right with fluid buildup especially in the left fifth MPJ. Plantar callus left lateral heel that's also sore when pressed and patient's found to have good digital perfusion and is well oriented 3     Assessment:     Inflammatory capsulitis fifth MPJ left over right with thick lesion formation and pain    Plan:     H&P and all conditions and x-rays reviewed with patient. Injected the left fifth MPJ 3 mg Kenalog 5 mg Xylocaine and debrided lesions on both fifth MPJ some plantar left heel and patient will utilize revitaderm from the office to try to control the skin. Patient may and up requiring surgery to lift metatarsals depending on response and I educated him on this  X-rays indicated that the lesions for the most part on the fifth metatarsal head bilateral

## 2016-04-22 NOTE — Progress Notes (Signed)
   Subjective:    Patient ID: Darrell Hoffman, male    DOB: 02-05-1951, 65 y.o.   MRN: [id number]  HPI Chief Complaint  Patient presents with  . Callouses    Bilateral; plantar forefoot-below 5th toe; On Left foot-heel-lateral      Review of Systems  HENT: Positive for sinus pressure.   Respiratory: Positive for cough, shortness of breath and wheezing.   Musculoskeletal: Positive for arthralgias.  All other systems reviewed and are negative.      Objective:   Physical Exam        Assessment & Plan:

## 2016-05-20 ENCOUNTER — Telehealth: Payer: Self-pay

## 2016-05-20 NOTE — Telephone Encounter (Signed)
FMLA paperwork completed, signed, copy sent to scan. Original placed in cabinet for pt pick up, pt advised of same

## 2016-05-23 ENCOUNTER — Ambulatory Visit (AMBULATORY_SURGERY_CENTER): Payer: Self-pay

## 2016-05-23 VITALS — Ht 66.5 in | Wt 217.0 lb

## 2016-05-23 DIAGNOSIS — Z86010 Personal history of colonic polyps: Secondary | ICD-10-CM

## 2016-05-23 MED ORDER — NA SULFATE-K SULFATE-MG SULF 17.5-3.13-1.6 GM/177ML PO SOLN: 1.0000 | Freq: Once | ORAL | Status: DC

## 2016-05-23 NOTE — Progress Notes (Signed)
No egg or soy allergy.  No previous complications from anesthesia. No home O2. No diet meds. 

## 2016-05-27 ENCOUNTER — Encounter: Payer: Self-pay | Admitting: Gastroenterology

## 2016-06-05 ENCOUNTER — Telehealth: Payer: Self-pay

## 2016-06-05 NOTE — Telephone Encounter (Signed)
Patient called his FMLA paper that was dropped off last week. I dont see anything. Can you follow up. Thank you.

## 2016-06-06 ENCOUNTER — Ambulatory Visit (AMBULATORY_SURGERY_CENTER): Payer: Federal, State, Local not specified - PPO | Admitting: Gastroenterology

## 2016-06-06 ENCOUNTER — Encounter: Payer: Self-pay | Admitting: Gastroenterology

## 2016-06-06 VITALS — BP 135/96 | HR 75 | Temp 97.3°F | Resp 17 | Ht 66.0 in | Wt 226.0 lb

## 2016-06-06 DIAGNOSIS — D123 Benign neoplasm of transverse colon: Secondary | ICD-10-CM | POA: Diagnosis not present

## 2016-06-06 DIAGNOSIS — Z86010 Personal history of colonic polyps: Secondary | ICD-10-CM

## 2016-06-06 DIAGNOSIS — Z860101 Personal history of adenomatous and serrated colon polyps: Secondary | ICD-10-CM

## 2016-06-06 HISTORY — PX: COLONOSCOPY: SHX174

## 2016-06-06 MED ORDER — SODIUM CHLORIDE 0.9 % IV SOLN: 500.0000 mL | INTRAVENOUS | Status: DC

## 2016-06-06 NOTE — Patient Instructions (Signed)
YOU HAD AN ENDOSCOPIC PROCEDURE TODAY AT [facility]:   Refer to the procedure report that was given to you for any specific questions about what was found during the examination.  If the procedure report does not answer your questions, please call your gastroenterologist to clarify.  If you requested that your care partner not be given the details of your procedure findings, then the procedure report has been included in a sealed envelope for you to review at your convenience later.  YOU SHOULD EXPECT: Some feelings of bloating in the abdomen. Passage of more gas than usual.  Walking can help get rid of the air that was put into your GI tract during the procedure and reduce the bloating. If you had a lower endoscopy (such as a colonoscopy or flexible sigmoidoscopy) you may notice spotting of blood in your stool or on the toilet paper. If you underwent a bowel prep for your procedure, you may not have a normal bowel movement for a few days.  Please Note:  You might notice some irritation and congestion in your nose or some drainage.  This is from the oxygen used during your procedure.  There is no need for concern and it should clear up in a day or so.  SYMPTOMS TO REPORT IMMEDIATELY:   Following lower endoscopy (colonoscopy or flexible sigmoidoscopy):  Excessive amounts of blood in the stool  Significant tenderness or worsening of abdominal pains  Swelling of the abdomen that is new, acute  Fever of 100F or higher    For urgent or emergent issues, a gastroenterologist can be reached at any hour by calling [phone].   DIET: Your first meal following the procedure should be a small meal and then it is ok to progress to your normal diet. Heavy or fried foods are harder to digest and may make you feel nauseous or bloated.  Likewise, meals heavy in dairy and vegetables can increase bloating.  Drink plenty of fluids but you should avoid alcoholic beverages for 24  hours.  ACTIVITY:  You should plan to take it easy for the rest of today and you should NOT DRIVE or use heavy machinery until tomorrow (because of the sedation medicines used during the test).    FOLLOW UP: Our staff will call the number listed on your records the next business day following your procedure to check on you and address any questions or concerns that you may have regarding the information given to you following your procedure. If we do not reach you, we will leave a message.  However, if you are feeling well and you are not experiencing any problems, there is no need to return our call.  We will assume that you have returned to your regular daily activities without incident.  If any biopsies were taken you will be contacted by phone or by letter within the next 1-3 weeks.  Please call us at [phone] if you have not heard about the biopsies in 3 weeks.    SIGNATURES/CONFIDENTIALITY: You and/or your care partner have signed paperwork which will be entered into your electronic medical record.  These signatures attest to the fact that that the information above on your After Visit Summary has been reviewed and is understood.  Full responsibility of the confidentiality of this discharge information lies with you and/or your care-partner.   No aspirin,ibuprofen,naproxen,or non-sterodial anti-inflammatory for 2 weeks,resume remainder of medications. Information given on polyps,diverticulosis,hemorrhoid and high fiber diet.

## 2016-06-06 NOTE — Telephone Encounter (Signed)
See note 05/20/2016 - pt advised via personal VM that FMLA required additional information and was received from and sent back to medical records as of 06/05/2016.  Number to medical records also left on pt's VM

## 2016-06-06 NOTE — Op Note (Addendum)
[place] Patient Name: Darrell Hoffman Procedure Date: 06/06/2016 8:33 AM MRN: [id number] Endoscopist: Ladene Artist , MD Age: 65 Referring MD:  Date of Birth: 12-24-1950 Gender: Male Account #: [id number] Procedure:                Colonoscopy Indications:              Surveillance: Personal history of adenomatous                            polyps on last colonoscopy > 5 years ago Medicines:                Monitored Anesthesia Care Procedure:                Pre-Anesthesia Assessment:                           - Prior to the procedure, a History and Physical                            was performed, and patient medications and                            allergies were reviewed. The patient's tolerance of                            previous anesthesia was also reviewed. The risks                            and benefits of the procedure and the sedation                            options and risks were discussed with the patient.                            All questions were answered, and informed consent                            was obtained. Prior Anticoagulants: The patient has                            taken no previous anticoagulant or antiplatelet                            agents. ASA Grade Assessment: II - A patient with                            mild systemic disease. After reviewing the risks                            and benefits, the patient was deemed in                            satisfactory condition to undergo the procedure.  After obtaining informed consent, the colonoscope                            was passed under direct vision. Throughout the                            procedure, the patient's blood pressure, pulse, and                            oxygen saturations were monitored continuously. The                            Model PCF-H190L [id number]) scope was introduced                            through the anus and  advanced to the the cecum,                            identified by appendiceal orifice and ileocecal                            valve. The colonoscopy was performed without                            difficulty. The patient tolerated the procedure                            well. The quality of the bowel preparation was                            adequate. The ileocecal valve, appendiceal orifice,                            and rectum were photographed. Scope In: 8:42:52 AM Scope Out: 8:55:40 AM Scope Withdrawal Time: 0 hours 10 minutes 38 seconds  Total Procedure Duration: 0 hours 12 minutes 48 seconds  Findings:                 The digital rectal exam was normal.                           A 14 mm polyp was found in the mid transverse                            colon. The polyp was pedunculated. The polyp was                            removed with a hot snare. Resection and retrieval                            were complete.                           A 8 mm polyp was found in the distal transverse  colon. The polyp was semi-pedunculated. The polyp                            was removed with a cold snare. Resection and                            retrieval were complete.                           A few small-mouthed diverticula were found in the                            transverse colon. There was no evidence of                            diverticular bleeding.                           Internal hemorrhoids were found during                            retroflexion. The hemorrhoids were small and Grade                            I (internal hemorrhoids that do not prolapse).                           The exam was otherwise normal throughout the                            examined colon. Complications:            No immediate complications. Estimated Blood Loss:     Estimated blood loss was minimal. Impression:               - One 14 mm polyp in the mid  transverse colon,                            removed with a hot snare. Resected and retrieved.                           - One 8 mm polyp in the distal transverse colon,                            removed with a cold snare. Resected and retrieved.                           - Mild diverticulosis in the transverse colon.                           - Internal hemorrhoids. Recommendation:           - Patient has a contact number available for                            emergencies. The signs  and symptoms of potential                            delayed complications were discussed with the                            patient. Return to normal activities tomorrow.                            Written discharge instructions were provided to the                            patient.                           - Resume previous diet.                           - Continue present medications.                           - No aspirin, ibuprofen, naproxen, or other                            non-steroidal anti-inflammatory drugs for 2 weeks                            after polyp removal.                           - Await pathology results.                           - Repeat colonoscopy in 3 years for surveillance. Ladene Artist, MD 06/06/2016 9:00:59 AM This report has been signed electronically.

## 2016-06-06 NOTE — Progress Notes (Signed)
Called to room to assist during endoscopic procedure.  Patient ID and intended procedure confirmed with present staff. Received instructions for my participation in the procedure from the performing physician.  

## 2016-06-06 NOTE — Progress Notes (Signed)
Report to PACU, RN, vss, BBS= Clear.  

## 2016-06-07 ENCOUNTER — Telehealth: Payer: Self-pay | Admitting: *Deleted

## 2016-06-07 NOTE — Telephone Encounter (Signed)
  Follow up Call-  Call back number 06/06/2016  Post procedure Call Back phone  # [phone]  Permission to leave phone message Yes     Patient questions:  Do you have a fever, pain , or abdominal swelling? No. Pain Score  0 *  Have you tolerated food without any problems? Yes.    Have you been able to return to your normal activities? Yes.    Do you have any questions about your discharge instructions: Diet   No. Medications  No. Follow up visit  No.  Do you have questions or concerns about your Care? No.  Actions: * If pain score is 4 or above: No action needed, pain <4.

## 2016-06-11 ENCOUNTER — Encounter: Payer: Self-pay | Admitting: Gastroenterology

## 2016-06-22 ENCOUNTER — Other Ambulatory Visit: Payer: Self-pay | Admitting: Internal Medicine

## 2016-07-18 ENCOUNTER — Telehealth: Payer: Self-pay | Admitting: Internal Medicine

## 2016-07-18 NOTE — Telephone Encounter (Signed)
Pt is check on FMLA paper work that fax over to [place] yesterday. Please check and call pt back   # [phone]

## 2016-07-18 NOTE — Telephone Encounter (Signed)
Paperwork updated and placed on MD's desk for signature

## 2016-07-21 ENCOUNTER — Other Ambulatory Visit: Payer: Self-pay | Admitting: Internal Medicine

## 2016-07-22 NOTE — Telephone Encounter (Signed)
Paperwork signed and placed in cabinet for pt pick up, pt advised of same

## 2016-12-02 ENCOUNTER — Other Ambulatory Visit: Payer: Self-pay | Admitting: *Deleted

## 2016-12-02 MED ORDER — ALBUTEROL SULFATE HFA 108 (90 BASE) MCG/ACT IN AERS: 2.0000 | INHALATION_SPRAY | RESPIRATORY_TRACT | 0 refills | Status: DC | PRN

## 2017-01-24 ENCOUNTER — Other Ambulatory Visit: Payer: Self-pay | Admitting: Internal Medicine

## 2017-03-25 ENCOUNTER — Other Ambulatory Visit (INDEPENDENT_AMBULATORY_CARE_PROVIDER_SITE_OTHER): Payer: Federal, State, Local not specified - PPO

## 2017-03-25 DIAGNOSIS — Z0000 Encounter for general adult medical examination without abnormal findings: Secondary | ICD-10-CM | POA: Diagnosis not present

## 2017-03-25 LAB — PSA: PSA: 0.78 ng/mL (ref 0.10–4.00)

## 2017-03-25 LAB — CBC WITH DIFFERENTIAL/PLATELET
Basophils Absolute: 0 10*3/uL (ref 0.0–0.1)
Basophils Relative: 0.4 % (ref 0.0–3.0)
Eosinophils Absolute: 0.1 10*3/uL (ref 0.0–0.7)
Eosinophils Relative: 1 % (ref 0.0–5.0)
HCT: 35.4 % — ABNORMAL LOW (ref 39.0–52.0)
Hemoglobin: 11.6 g/dL — ABNORMAL LOW (ref 13.0–17.0)
Lymphocytes Relative: 38.4 % (ref 12.0–46.0)
Lymphs Abs: 2.3 10*3/uL (ref 0.7–4.0)
MCHC: 32.8 g/dL (ref 30.0–36.0)
MCV: 84.4 fl (ref 78.0–100.0)
Monocytes Absolute: 0.3 10*3/uL (ref 0.1–1.0)
Monocytes Relative: 5.4 % (ref 3.0–12.0)
Neutro Abs: 3.2 10*3/uL (ref 1.4–7.7)
Neutrophils Relative %: 54.8 % (ref 43.0–77.0)
Platelets: 211 10*3/uL (ref 150.0–400.0)
RBC: 4.19 Mil/uL — ABNORMAL LOW (ref 4.22–5.81)
RDW: 14.5 % (ref 11.5–15.5)
WBC: 5.9 10*3/uL (ref 4.0–10.5)

## 2017-03-25 LAB — URINALYSIS, ROUTINE W REFLEX MICROSCOPIC
Bilirubin Urine: NEGATIVE
Hgb urine dipstick: NEGATIVE
Ketones, ur: NEGATIVE
Leukocytes, UA: NEGATIVE
Nitrite: NEGATIVE
RBC / HPF: NONE SEEN (ref 0–?)
Specific Gravity, Urine: 1.01 (ref 1.000–1.030)
Total Protein, Urine: NEGATIVE
Urine Glucose: NEGATIVE
Urobilinogen, UA: 0.2 (ref 0.0–1.0)
WBC, UA: NONE SEEN (ref 0–?)
pH: 7 (ref 5.0–8.0)

## 2017-03-25 LAB — HEPATIC FUNCTION PANEL
ALT: 15 U/L (ref 0–53)
AST: 16 U/L (ref 0–37)
Albumin: 4 g/dL (ref 3.5–5.2)
Alkaline Phosphatase: 63 U/L (ref 39–117)
Bilirubin, Direct: 0.1 mg/dL (ref 0.0–0.3)
Total Bilirubin: 0.4 mg/dL (ref 0.2–1.2)
Total Protein: 6.7 g/dL (ref 6.0–8.3)

## 2017-03-25 LAB — LIPID PANEL
Cholesterol: 143 mg/dL (ref 0–200)
HDL: 37.4 mg/dL — ABNORMAL LOW (ref >39.00)
LDL Cholesterol: 72 mg/dL (ref 0–99)
NonHDL: 105.51
Total CHOL/HDL Ratio: 4
Triglycerides: 170 mg/dL — ABNORMAL HIGH (ref 0.0–149.0)
VLDL: 34 mg/dL (ref 0.0–40.0)

## 2017-03-25 LAB — BASIC METABOLIC PANEL
BUN: 7 mg/dL (ref 6–23)
CO2: 29 mEq/L (ref 19–32)
Calcium: 9.2 mg/dL (ref 8.4–10.5)
Chloride: 110 mEq/L (ref 96–112)
Creatinine, Ser: 1.46 mg/dL (ref 0.40–1.50)
GFR: 62.38 mL/min (ref >60.00)
Glucose, Bld: 111 mg/dL — ABNORMAL HIGH (ref 70–99)
Potassium: 5 mEq/L (ref 3.5–5.1)
Sodium: 144 mEq/L (ref 135–145)

## 2017-03-25 LAB — TSH: TSH: 2.26 u[IU]/mL (ref 0.35–4.50)

## 2017-03-26 LAB — HEPATITIS C ANTIBODY: HCV Ab: NEGATIVE

## 2017-03-27 ENCOUNTER — Encounter: Payer: Self-pay | Admitting: Internal Medicine

## 2017-03-27 ENCOUNTER — Ambulatory Visit (INDEPENDENT_AMBULATORY_CARE_PROVIDER_SITE_OTHER): Payer: Federal, State, Local not specified - PPO | Admitting: Internal Medicine

## 2017-03-27 VITALS — BP 158/100 | HR 82 | Temp 98.9°F | Ht 66.0 in | Wt 213.0 lb

## 2017-03-27 DIAGNOSIS — Z0000 Encounter for general adult medical examination without abnormal findings: Secondary | ICD-10-CM

## 2017-03-27 DIAGNOSIS — M79644 Pain in right finger(s): Secondary | ICD-10-CM | POA: Diagnosis not present

## 2017-03-27 DIAGNOSIS — I10 Essential (primary) hypertension: Secondary | ICD-10-CM

## 2017-03-27 MED ORDER — DICLOFENAC SODIUM 1 % TD GEL: 4.0000 g | Freq: Four times a day (QID) | TRANSDERMAL | 11 refills | Status: DC | PRN

## 2017-03-27 MED ORDER — TRIAMCINOLONE ACETONIDE 55 MCG/ACT NA AERO: 2.0000 | INHALATION_SPRAY | Freq: Every day | NASAL | 12 refills | Status: DC

## 2017-03-27 MED ORDER — LOSARTAN POTASSIUM 50 MG PO TABS: 50.0000 mg | ORAL_TABLET | Freq: Every day | ORAL | 3 refills | Status: DC

## 2017-03-27 NOTE — Progress Notes (Signed)
Subjective:    Patient ID: Darrell Hoffman, male    DOB: [birth year], 66 y.o.   MRN: [id number]  HPI  Here for wellness and f/u;  Overall doing ok;  Pt denies Chest pain, worsening SOB, DOE, wheezing, orthopnea, PND, worsening LE edema, palpitations, dizziness or syncope.  Pt denies neurological change such as new headache, facial or extremity weakness.  Pt denies polydipsia, polyuria, or low sugar symptoms. Pt states overall good compliance with treatment and medications, good tolerability, and has been trying to follow appropriate diet.  Pt denies worsening depressive symptoms, suicidal ideation or panic. No fever, night sweats, wt loss, loss of appetite, or other constitutional symptoms.  Pt states good ability with ADL's, has low fall risk, home safety reviewed and adequate, no other significant changes in hearing or vision, and only occasionally active with exercise. As he has been having some right 4th finger pain and swelling to the PIP as well vague bilat toe pain in the last few wks, without swelling or trauma No other hx changes Wt Readings from Last 3 Encounters:  03/27/17 213 lb (96.6 kg)  06/06/16 226 lb (102.5 kg)  05/23/16 217 lb (98.4 kg)   BP Readings from Last 3 Encounters:  03/27/17 (!) 158/100  06/06/16 (!) 135/96  04/22/16 (!) 148/88   Past Medical History:  Diagnosis Date  . ALCOHOL ABUSE, HX OF 10/03/2007  . ALLERGIC RHINITIS 10/03/2007  . ANEMIA-B12 DEFICIENCY 12/04/2009  . ANEMIA-IRON DEFICIENCY 11/27/2009  . ANEMIA-NOS 10/08/2007  . ASTHMA 10/03/2007  . B12 DEFICIENCY 10/06/2008  . COLONIC POLYPS, HX OF 10/03/2007  . ERECTILE DYSFUNCTION 10/03/2007  . GERD 10/03/2007  . HYPERLIPIDEMIA 10/03/2007  . HYPERTENSION 10/03/2007  . KNEE PAIN, RIGHT 10/08/2007  . Morbid obesity ([place]) 10/03/2007  . Substance abuse    marijuana daily   Past Surgical History:  Procedure Laterality Date  . bilateral hydrocele repair    . COLONOSCOPY    . POLYPECTOMY  01-03-2010  .  s/p left knee arthroscopy  2009  . s/p TKR  03/2009  . VASECTOMY      reports that he has never smoked. He has never used smokeless tobacco. He reports that he drinks alcohol. He reports that he uses drugs. family history includes Alcohol abuse in his paternal uncle; Breast cancer in his mother; Coronary artery disease in his other; Diabetes in his cousin and sister; Hypertension in his sister; Lupus in his sister. No Known Allergies Current Outpatient Prescriptions on File Prior to Visit  Medication Sig Dispense Refill  . albuterol (PROVENTIL HFA) 108 (90 Base) MCG/ACT inhaler Inhale 2 puffs into the lungs every 4 (four) hours as needed. 3 Inhaler 0  . aspirin EC 81 MG tablet Take 1 tablet (81 mg total) by mouth daily. 90 tablet 11  . CIALIS 20 MG tablet TAKE 1 TABLET BY MOUTH DAILY AS NEEDED 5 tablet 11  . cyanocobalamin (,VITAMIN B-12,) 1000 MCG/ML injection Inject 1,000 mcg into the muscle. Inject 1 ML IM once per month     . Fluticasone-Salmeterol (ADVAIR DISKUS) 500-50 MCG/DOSE AEPB Inhale 1 puff into the lungs 2 (two) times daily. 180 each 3  . omeprazole (PRILOSEC) 20 MG capsule TAKE ONE CAPSULE BY MOUTH TWICE DAILY 60 capsule 1   No current facility-administered medications on file prior to visit.    Review of Systems Constitutional: Negative for other unusual diaphoresis, sweats, appetite or weight changes HENT: Negative for other worsening hearing loss, ear pain, facial swelling, mouth sores or  neck stiffness.   Eyes: Negative for other worsening pain, redness or other visual disturbance.  Respiratory: Negative for other stridor or swelling Cardiovascular: Negative for other palpitations or other chest pain  Gastrointestinal: Negative for worsening diarrhea or loose stools, blood in stool, distention or other pain Genitourinary: Negative for hematuria, flank pain or other change in urine volume.  Musculoskeletal: Negative for myalgias or other joint swelling.  Skin: Negative for  other color change, or other wound or worsening drainage.  Neurological: Negative for other syncope or numbness. Hematological: Negative for other adenopathy or swelling Psychiatric/Behavioral: Negative for hallucinations, other worsening agitation, SI, self-injury, or new decreased concentration All other system neg per pt    Objective:   Physical Exam BP (!) 158/100   Pulse 82   Temp 98.9 F (37.2 C) (Oral)   Ht 5\' 6"  (1.676 m)   Wt 213 lb (96.6 kg)   SpO2 100%   BMI 34.38 kg/m  VS noted, obese Constitutional: Pt is oriented to person, place, and time. Appears well-developed and well-nourished, in no significant distress and comfortable Head: Normocephalic and atraumatic  Eyes: Conjunctivae and EOM are normal. Pupils are equal, round, and reactive to light Right Ear: External ear normal without discharge Left Ear: External ear normal without discharge Nose: Nose without discharge or deformity Mouth/Throat: Oropharynx is without other ulcerations and moist  Neck: Normal range of motion. Neck supple. No JVD present. No tracheal deviation present or significant neck LA or mass Cardiovascular: Normal rate, regular rhythm, normal heart sounds and intact distal pulses.   Pulmonary/Chest: WOB normal and breath sounds without rales or wheezing  Abdominal: Soft. Bowel sounds are normal. NT. No HSM  Musculoskeletal: Normal range of motion. Exhibits no edema, right 4th finger with mild PIP tender swelling and slight reduced ROM Lymphadenopathy: Has no other cervical adenopathy.  Neurological: Pt is alert and oriented to person, place, and time. Pt has normal reflexes. No cranial nerve deficit. Motor grossly intact, Gait intact Skin: Skin is warm and dry. No rash noted or new ulcerations Psychiatric:  Has normal mood and affect. Behavior is normal without agitation No other exam findings   ECG today I have personally interpreted Sinus  Rhythm  - occasional PAC    -  Diffuse nonspecific  T-abnormality.      Assessment & Plan:

## 2017-03-27 NOTE — Assessment & Plan Note (Signed)

## 2017-03-27 NOTE — Assessment & Plan Note (Signed)
New onset, borderline for some time, has lost wt but now willing to consider BP med - for losartan 50 qd, f/u BP at home and next visit

## 2017-03-27 NOTE — Assessment & Plan Note (Signed)
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

## 2017-03-27 NOTE — Patient Instructions (Signed)
Please take all new medication as prescribed - the losartan 50 mg per day, and the topical gel for arthritis  Please check your BP at home on a regular basis, with the goal being < 140/90  Please continue all other medications as before, and refills have been done if requested.  Please have the pharmacy call with any other refills you may need.  Please continue your efforts at being more active, low cholesterol diet, and weight control.  You are otherwise up to date with prevention measures today.  Please keep your appointments with your specialists as you may have planned  Please return in 6 months, or sooner if needed, with Lab testing done 3-5 days before

## 2017-03-27 NOTE — Assessment & Plan Note (Deleted)

## 2017-03-27 NOTE — Progress Notes (Signed)
Pre visit review using our clinic review tool, if applicable. No additional management support is needed unless otherwise documented below in the visit note. 

## 2017-03-31 ENCOUNTER — Other Ambulatory Visit: Payer: Self-pay | Admitting: Internal Medicine

## 2017-04-01 NOTE — Telephone Encounter (Signed)
Done erx 

## 2017-04-18 ENCOUNTER — Emergency Department (HOSPITAL_COMMUNITY)
Admission: EM | Admit: 2017-04-18 | Discharge: 2017-04-18 | Disposition: A | Discharge status: Home / Self Care | Payer: Federal, State, Local not specified - PPO | Attending: Emergency Medicine | Admitting: Emergency Medicine

## 2017-04-18 ENCOUNTER — Encounter (HOSPITAL_COMMUNITY): Payer: Self-pay | Admitting: Emergency Medicine

## 2017-04-18 ENCOUNTER — Emergency Department (HOSPITAL_COMMUNITY): Payer: Federal, State, Local not specified - PPO

## 2017-04-18 DIAGNOSIS — Z79899 Other long term (current) drug therapy: Secondary | ICD-10-CM | POA: Diagnosis not present

## 2017-04-18 DIAGNOSIS — M25572 Pain in left ankle and joints of left foot: Secondary | ICD-10-CM | POA: Diagnosis not present

## 2017-04-18 DIAGNOSIS — Z7982 Long term (current) use of aspirin: Secondary | ICD-10-CM | POA: Diagnosis not present

## 2017-04-18 DIAGNOSIS — I10 Essential (primary) hypertension: Secondary | ICD-10-CM | POA: Insufficient documentation

## 2017-04-18 DIAGNOSIS — J45909 Unspecified asthma, uncomplicated: Secondary | ICD-10-CM | POA: Insufficient documentation

## 2017-04-18 MED ORDER — IBUPROFEN 800 MG PO TABS
800.0000 mg | ORAL_TABLET | Freq: Once | ORAL | Status: AC
  Administered 2017-04-18: 800 mg via ORAL
  Filled 2017-04-18: qty 1

## 2017-04-18 MED ORDER — INDOMETHACIN 50 MG PO CAPS: 50.0000 mg | ORAL_CAPSULE | Freq: Two times a day (BID) | ORAL | 0 refills | Status: DC

## 2017-04-18 NOTE — ED Triage Notes (Signed)
Pt reports L ankle pain that began today. No known injury or obvious swelling.

## 2017-04-18 NOTE — ED Notes (Signed)
Pt reports L ankle pain today, denies injury.  Mild swelling.

## 2017-04-18 NOTE — Discharge Instructions (Signed)
Read the information below.  Use the prescribed medication as directed.  Please discuss all new medications with your pharmacist.  You may return to the Emergency Department at any time for worsening condition or any new symptoms that concern you.   If you develop uncontrolled pain, weakness or numbness of the extremity, severe discoloration of the skin, or you are unable to walk or move your ankle, return to the ER for a recheck.

## 2017-04-18 NOTE — ED Provider Notes (Signed)
[facility] DEPT Provider Note   CSN: [id number] Arrival date & time: 04/18/17  1551  By signing my name below, I, Darrell Hoffman, attest that this documentation has been prepared under the direction and in the presence of [facility], PA-C.  Electronically Signed: Reola Hoffman, ED Scribe. 04/18/17. 5:39 PM.  History   Chief Complaint Chief Complaint  Patient presents with  . Ankle Pain   The history is provided by the patient. No language interpreter was used.    HPI Comments: Darrell Hoffman is a 66 y.o. male with a h/o HTN, who presents to the Emergency Department complaining of persistent, worsening left ankle pain with associated swelling over the joint beginning this morning. He reports that his pain is worse on the lateral aspect of the ankle. No recent injury or trauma to the ankle. 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 His pain is worse with weight bearing and movement of the joint, and his pain is mildly alleviated with elevation of the ankle. No treatments for his symptoms were tried prior to coming into the ED. No recent illnesses/infections. Pt does have a FHx, but is without a personal Hx of Gout. He denies fever, calf pain, leg swelling, chest pain, shortness of breath, weakness, numbness, or any other associated symptoms.   Past Medical History:  Diagnosis Date  . ALCOHOL ABUSE, HX OF 10/03/2007  . ALLERGIC RHINITIS 10/03/2007  . ANEMIA-B12 DEFICIENCY 12/04/2009  . ANEMIA-IRON DEFICIENCY 11/27/2009  . ANEMIA-NOS 10/08/2007  . ASTHMA 10/03/2007  . B12 DEFICIENCY 10/06/2008  . COLONIC POLYPS, HX OF 10/03/2007  . ERECTILE DYSFUNCTION 10/03/2007  . GERD 10/03/2007  . HYPERLIPIDEMIA 10/03/2007  . HYPERTENSION 10/03/2007  . KNEE PAIN, RIGHT 10/08/2007  . Morbid obesity ([place]) 10/03/2007  . Substance abuse    marijuana daily   Patient Active Problem List   Diagnosis Date Noted  . Finger pain, right 03/27/2017  .  Wellness examination 03/27/2017  . History of colonic polyps 03/27/2016  . Pre-ulcerative corn or callous 03/27/2016  . Skin nodule 01/10/2016  . Venereal disease, unspecified 03/23/2012  . Renal insufficiency 04/02/2011  . Right elbow pain 04/02/2011  . Encounter for preventative adult health care exam with abnormal findings 03/30/2011  . ANEMIA-B12 DEFICIENCY 12/04/2009  . ANEMIA-IRON DEFICIENCY 11/27/2009  . B12 DEFICIENCY 10/06/2008  . KNEE PAIN, RIGHT 10/08/2007  . Hyperlipidemia 10/03/2007  . Morbid obesity ([place]) 10/03/2007  . ERECTILE DYSFUNCTION 10/03/2007  . Essential hypertension 10/03/2007  . ALLERGIC RHINITIS 10/03/2007  . Asthma 10/03/2007  . GERD 10/03/2007  . ALCOHOL ABUSE, HX OF 10/03/2007  . COLONIC POLYPS, HX OF 10/03/2007   Past Surgical History:  Procedure Laterality Date  . bilateral hydrocele repair    . COLONOSCOPY    . POLYPECTOMY  01-03-2010  . s/p left knee arthroscopy  2009  . s/p TKR  03/2009  . VASECTOMY      Home Medications    Prior to Admission medications   Medication Sig Start Date End Date Taking? Authorizing Provider  ADVAIR DISKUS 500-50 MCG/DOSE AEPB INHALE 1 PUFF BY MOUTH TWICE DAILY 04/01/17   Biagio Borg, MD  albuterol (PROVENTIL HFA) 108 (90 Base) MCG/ACT inhaler Inhale 2 puffs into the lungs every 4 (four) hours as needed. 12/02/16   Biagio Borg, MD  aspirin EC 81 MG tablet Take 1 tablet (81 mg total) by mouth daily. 03/27/16   Biagio Borg, MD  CIALIS 20 MG  tablet TAKE 1 TABLET BY MOUTH DAILY AS NEEDED 04/23/12   Biagio Borg, MD  cyanocobalamin (,VITAMIN B-12,) 1000 MCG/ML injection Inject 1,000 mcg into the muscle. Inject 1 ML IM once per month     Historical Provider, MD  diclofenac sodium (VOLTAREN) 1 % GEL Apply 4 g topically 4 (four) times daily as needed. 03/27/17   Biagio Borg, MD  indomethacin (INDOCIN) 50 MG capsule Take 1 capsule (50 mg total) by mouth 2 (two) times daily with a meal. 04/18/17   Clayton Bibles, PA-C  losartan  (COZAAR) 50 MG tablet Take 1 tablet (50 mg total) by mouth daily. 03/27/17   Biagio Borg, MD  omeprazole (PRILOSEC) 20 MG capsule TAKE ONE CAPSULE BY MOUTH TWICE DAILY 04/01/17   Biagio Borg, MD  triamcinolone (NASACORT AQ) 55 MCG/ACT AERO nasal inhaler Place 2 sprays into the nose daily. 03/27/17   Biagio Borg, MD   Family History Family History  Problem Relation Age of Onset  . Breast cancer Mother   . Lupus Sister   . Hypertension Sister   . Diabetes Sister   . Alcohol abuse Paternal Uncle   . Coronary artery disease Other     male 1st degree relative - MI at 65 yo  . Diabetes Cousin     2 cousins  . Colon cancer Neg Hx   . Esophageal cancer Neg Hx   . Rectal cancer Neg Hx   . Stomach cancer Neg Hx    Social History Social History  Substance Use Topics  . Smoking status: Never Smoker  . Smokeless tobacco: Never Used  . Alcohol use 0.0 oz/week     Comment: occasionally   Allergies   Patient has no known allergies.  Review of Systems Review of Systems  Constitutional: Negative for fever.  Respiratory: Negative for shortness of breath.   Cardiovascular: Negative for chest pain and leg swelling.  Musculoskeletal: Positive for arthralgias (left ankle).  Skin: Negative for color change, pallor, rash and wound.  Allergic/Immunologic: Negative for immunocompromised state.  Neurological: Negative for weakness and numbness.  Psychiatric/Behavioral: Negative for self-injury.   Physical Exam Updated Vital Signs BP (!) 165/102 (BP Location: Right Arm)   Pulse 86   Temp 98.3 F (36.8 C) (Oral)   Resp 16   SpO2 99%   Physical Exam  Constitutional: He appears well-developed and well-nourished.  HENT:  Head: Normocephalic and atraumatic.  Neck: Neck supple.  Pulmonary/Chest: Effort normal.  Musculoskeletal: He exhibits edema and tenderness.  LLE: Left lateral ankle w/ edema and tenderness. No erythema or warmth. No focal bony tenderness. Calf is soft and non-tender.  Pulses and sensation intact. Moves all toes.   Neurological: He is alert.  Nursing note and vitals reviewed.  ED Treatments / Results  DIAGNOSTIC STUDIES: Oxygen Saturation is 95% on RA, adequate by my interpretation.   COORDINATION OF CARE: 5:39 PM-Discussed next steps with pt. Pt verbalized understanding and is agreeable with the plan.   Labs (all labs ordered are listed, but only abnormal results are displayed) Labs Reviewed - No data to display  EKG  EKG Interpretation None      Radiology Dg Ankle Complete Left  Result Date: 04/18/2017 CLINICAL DATA:  Left ankle pain. EXAM: LEFT ANKLE COMPLETE - 3+ VIEW COMPARISON:  None FINDINGS: There is no evidence of fracture, dislocation, or joint effusion. Small plantar heel spur. There is no evidence of arthropathy or other focal bone abnormality. Soft tissues are unremarkable. IMPRESSION:  Negative. Electronically Signed   By: Kerby Moors M.D.   On: 04/18/2017 17:28    Procedures Procedures   Medications Ordered in ED Medications  ibuprofen (ADVIL,MOTRIN) tablet 800 mg (800 mg Oral Given 04/18/17 1834)    Initial Impression / Assessment and Plan / ED Course  I have reviewed the triage vital signs and the nursing notes.  Pertinent labs & imaging results that were available during my care of the patient were reviewed by me and considered in my medical decision making (see chart for details).    Afebrile, nontoxic patient with pain in his left ankle without injury.  Xray negative.  No sick symptoms, no erythema or warmth.  Doubt septic joint.  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  Has family hx gout. D/C home with NSAID, PCP follow up, ortho referral to r/o gout.  Discussed result, findings, treatment, and follow up  with patient.  Pt given return precautions.  Pt verbalizes understanding and agrees with plan.      Final Clinical Impressions(s) / ED Diagnoses   Final diagnoses:  Acute left ankle pain   New  Prescriptions Discharge Medication List as of 04/18/2017  5:48 PM    START taking these medications   Details  indomethacin (INDOCIN) 50 MG capsule Take 1 capsule (50 mg total) by mouth 2 (two) times daily with a meal., Starting Fri 04/18/2017, Print       I personally performed the services described in this documentation, which was scribed in my presence. The recorded information has been reviewed and is accurate.    Clayton Bibles, PA-C 04/18/17 Docia Chuck    Duffy Bruce, MD 04/19/17 [phone]

## 2017-06-03 ENCOUNTER — Other Ambulatory Visit: Payer: Self-pay | Admitting: Internal Medicine

## 2017-08-05 ENCOUNTER — Other Ambulatory Visit: Payer: Self-pay | Admitting: Internal Medicine

## 2017-09-24 ENCOUNTER — Other Ambulatory Visit: Payer: Federal, State, Local not specified - PPO

## 2017-09-25 ENCOUNTER — Other Ambulatory Visit (INDEPENDENT_AMBULATORY_CARE_PROVIDER_SITE_OTHER): Payer: Federal, State, Local not specified - PPO

## 2017-09-25 ENCOUNTER — Encounter: Payer: Self-pay | Admitting: Internal Medicine

## 2017-09-25 ENCOUNTER — Telehealth: Payer: Self-pay

## 2017-09-25 ENCOUNTER — Ambulatory Visit (INDEPENDENT_AMBULATORY_CARE_PROVIDER_SITE_OTHER): Payer: Federal, State, Local not specified - PPO | Admitting: Internal Medicine

## 2017-09-25 VITALS — BP 152/100 | HR 83 | Temp 98.5°F | Ht 66.0 in | Wt 212.0 lb

## 2017-09-25 DIAGNOSIS — E785 Hyperlipidemia, unspecified: Secondary | ICD-10-CM | POA: Diagnosis not present

## 2017-09-25 DIAGNOSIS — Z23 Encounter for immunization: Secondary | ICD-10-CM | POA: Diagnosis not present

## 2017-09-25 DIAGNOSIS — I10 Essential (primary) hypertension: Secondary | ICD-10-CM

## 2017-09-25 DIAGNOSIS — J4520 Mild intermittent asthma, uncomplicated: Secondary | ICD-10-CM | POA: Diagnosis not present

## 2017-09-25 DIAGNOSIS — R400 Somnolence: Secondary | ICD-10-CM

## 2017-09-25 DIAGNOSIS — R739 Hyperglycemia, unspecified: Secondary | ICD-10-CM

## 2017-09-25 DIAGNOSIS — E538 Deficiency of other specified B group vitamins: Secondary | ICD-10-CM

## 2017-09-25 LAB — BASIC METABOLIC PANEL
BUN: 8 mg/dL (ref 6–23)
CO2: 30 mEq/L (ref 19–32)
Calcium: 9.2 mg/dL (ref 8.4–10.5)
Chloride: 106 mEq/L (ref 96–112)
Creatinine, Ser: 1.45 mg/dL (ref 0.40–1.50)
GFR: 62.78 mL/min (ref >60.00)
Glucose, Bld: 91 mg/dL (ref 70–99)
Potassium: 4.9 mEq/L (ref 3.5–5.1)
Sodium: 141 mEq/L (ref 135–145)

## 2017-09-25 LAB — HEPATIC FUNCTION PANEL
ALT: 11 U/L (ref 0–53)
AST: 14 U/L (ref 0–37)
Albumin: 4.3 g/dL (ref 3.5–5.2)
Alkaline Phosphatase: 53 U/L (ref 39–117)
Bilirubin, Direct: 0.1 mg/dL (ref 0.0–0.3)
Total Bilirubin: 0.4 mg/dL (ref 0.2–1.2)
Total Protein: 7.3 g/dL (ref 6.0–8.3)

## 2017-09-25 LAB — LIPID PANEL
Cholesterol: 157 mg/dL (ref 0–200)
HDL: 40.4 mg/dL (ref >39.00)
LDL Cholesterol: 81 mg/dL (ref 0–99)
NonHDL: 117.01
Total CHOL/HDL Ratio: 4
Triglycerides: 179 mg/dL — ABNORMAL HIGH (ref 0.0–149.0)
VLDL: 35.8 mg/dL (ref 0.0–40.0)

## 2017-09-25 LAB — VITAMIN B12: Vitamin B-12: 158 pg/mL — ABNORMAL LOW (ref 211–911)

## 2017-09-25 LAB — HEMOGLOBIN A1C: Hgb A1c MFr Bld: 6.3 % (ref 4.6–6.5)

## 2017-09-25 MED ORDER — LOSARTAN POTASSIUM-HCTZ 100-12.5 MG PO TABS
1.0000 | ORAL_TABLET | Freq: Every day | ORAL | 3 refills | Status: DC
Start: 2017-09-25 — End: 2018-09-10

## 2017-09-25 NOTE — Progress Notes (Signed)
Subjective:    Patient ID: Darrell Hoffman, male    DOB: [birth year], 66 y.o.   MRN: [id number]  HPI   Here to f/u; overall doing ok,  Pt denies chest pain, increasing sob or doe, wheezing, orthopnea, PND, increased LE swelling, palpitations, dizziness or syncope.  Pt denies new neurological symptoms such as new headache, or facial or extremity weakness or numbness.  Pt denies polydipsia, polyuria, or low sugar episode.  Pt states overall good compliance with meds, mostly trying to follow appropriate diet, with wt overall stable,  but little exercise however.Plans to be more active soon.  Does have at least mild daytime somnolence but does not tend to fall asleep, lives alone so not sure he snores or not, BP appears to be difficult to control BP Readings from Last 3 Encounters:  09/25/17 (!) 152/100  04/18/17 (!) 165/102  03/27/17 (!) 158/100  Admits to noncompliance with b12 shots, c/o diffuse paresthesias recurrent random without pain or weakness.   Pt denies polydipsia, polyuria Past Medical History:  Diagnosis Date  . ALCOHOL ABUSE, HX OF 10/03/2007  . ALLERGIC RHINITIS 10/03/2007  . ANEMIA-B12 DEFICIENCY 12/04/2009  . ANEMIA-IRON DEFICIENCY 11/27/2009  . ANEMIA-NOS 10/08/2007  . ASTHMA 10/03/2007  . B12 DEFICIENCY 10/06/2008  . COLONIC POLYPS, HX OF 10/03/2007  . ERECTILE DYSFUNCTION 10/03/2007  . GERD 10/03/2007  . HYPERLIPIDEMIA 10/03/2007  . HYPERTENSION 10/03/2007  . KNEE PAIN, RIGHT 10/08/2007  . Morbid obesity ([place]) 10/03/2007  . Substance abuse ([place])    marijuana daily   Past Surgical History:  Procedure Laterality Date  . bilateral hydrocele repair    . COLONOSCOPY    . POLYPECTOMY  01-03-2010  . s/p left knee arthroscopy  2009  . s/p TKR  03/2009  . VASECTOMY      reports that he has never smoked. He has never used smokeless tobacco. He reports that he drinks alcohol. He reports that he uses drugs. family history includes Alcohol abuse in his paternal uncle; Breast  cancer in his mother; Coronary artery disease in his other; Diabetes in his cousin and sister; Hypertension in his sister; Lupus in his sister. No Known Allergies Current Outpatient Prescriptions on File Prior to Visit  Medication Sig Dispense Refill  . ADVAIR DISKUS 500-50 MCG/DOSE AEPB INHALE 1 PUFF BY MOUTH TWICE DAILY 60 each 1  . albuterol (PROVENTIL HFA) 108 (90 Base) MCG/ACT inhaler Inhale 2 puffs into the lungs every 4 (four) hours as needed. 3 Inhaler 0  . ASPIRIN LOW DOSE 81 MG EC tablet TAKE 1 TABLET BY MOUTH DAILY 90 tablet 2  . CIALIS 20 MG tablet TAKE 1 TABLET BY MOUTH DAILY AS NEEDED 5 tablet 11  . cyanocobalamin (,VITAMIN B-12,) 1000 MCG/ML injection Inject 1,000 mcg into the muscle. Inject 1 ML IM once per month     . diclofenac sodium (VOLTAREN) 1 % GEL Apply 4 g topically 4 (four) times daily as needed. 400 g 11  . indomethacin (INDOCIN) 50 MG capsule Take 1 capsule (50 mg total) by mouth 2 (two) times daily with a meal. 10 capsule 0  . omeprazole (PRILOSEC) 20 MG capsule TAKE ONE CAPSULE BY MOUTH TWICE DAILY 60 capsule 11  . triamcinolone (NASACORT AQ) 55 MCG/ACT AERO nasal inhaler Place 2 sprays into the nose daily. 1 Inhaler 12   No current facility-administered medications on file prior to visit.    Review of Systems  Constitutional: Negative for other unusual diaphoresis or sweats HENT: Negative for ear  discharge or swelling Eyes: Negative for other worsening visual disturbances Respiratory: Negative for stridor or other swelling  Gastrointestinal: Negative for worsening distension or other blood Genitourinary: Negative for retention or other urinary change Musculoskeletal: Negative for other MSK pain or swelling Skin: Negative for color change or other new lesions Neurological: Negative for worsening tremors and other numbness  Psychiatric/Behavioral: Negative for worsening agitation or other fatigue All other system neg per pt    Objective:   Physical Exam BP  (!) 152/100   Pulse 83   Temp 98.5 F (36.9 C) (Oral)   Ht 5\' 6"  (1.676 m)   Wt 212 lb (96.2 kg)   SpO2 99%   BMI 34.22 kg/m  VS noted, not ill appearing, + morbid obese Constitutional: Pt appears in NAD HENT: Head: NCAT.  Right Ear: External ear normal.  Left Ear: External ear normal.  Eyes: . Pupils are equal, round, and reactive to light. Conjunctivae and EOM are normal Nose: without d/c or deformity Neck: Neck supple. Gross normal ROM Cardiovascular: Normal rate and regular rhythm.   Pulmonary/Chest: Effort normal and breath sounds without rales or wheezing.  Abd:  Soft, NT, ND, + BS, no organomegaly Neurological: Pt is alert. At baseline orientation, motor grossly intact Skin: Skin is warm. No rashes, other new lesions, no LE edema Psychiatric: Pt behavior is normal without agitation  No other exam findings Lab Results  Component Value Date   WBC 5.9 03/25/2017   HGB 11.6 (L) 03/25/2017   HCT 35.4 (L) 03/25/2017   PLT 211.0 03/25/2017   GLUCOSE 91 09/25/2017   CHOL 157 09/25/2017   TRIG 179.0 (H) 09/25/2017   HDL 40.40 09/25/2017   LDLDIRECT 88.0 03/28/2015   LDLCALC 81 09/25/2017   ALT 11 09/25/2017   AST 14 09/25/2017   NA 141 09/25/2017   K 4.9 09/25/2017   CL 106 09/25/2017   CREATININE 1.45 09/25/2017   BUN 8 09/25/2017   CO2 30 09/25/2017   TSH 2.26 03/25/2017   PSA 0.78 03/25/2017   INR 2.5 (H) 04/23/2009   HGBA1C 6.3 09/25/2017       Assessment & Plan:

## 2017-09-25 NOTE — Assessment & Plan Note (Signed)
stable overall by history and exam, recent data reviewed with pt, and pt to continue medical treatment as before,  to f/u any worsening symptoms or concerns Lab Results  Component Value Date   HGBA1C 6.3 09/25/2017

## 2017-09-25 NOTE — Assessment & Plan Note (Signed)
stable overall by history and exam, and pt to continue medical treatment as before,  to f/u any worsening symptoms or concerns 

## 2017-09-25 NOTE — Assessment & Plan Note (Signed)
Encouraged compliance with med IM, and check b12 level

## 2017-09-25 NOTE — Assessment & Plan Note (Signed)
[facility] for home ONO per [facility], consider pulm referral if abnormal to r/o osa

## 2017-09-25 NOTE — Telephone Encounter (Signed)
Pt has been notified and expressed understanding.  He stated he would like to do shots because they are cheaper to do it that way.   He was transferred to the front to schedule a nurse visit to start B12 shots again.

## 2017-09-25 NOTE — Patient Instructions (Signed)
OK to change the losartan 50 mg to losartan HCT 100/12.5 mg per day day   Please check your blood pressure at home on a regular basis, and call for persistent BP > 140/90  You will be contacted regarding the referral for: Overnight oximetry (home oxygen testing)  Please continue all other medications as before, and refills have been done if requested.  Please have the pharmacy call with any other refills you may need.  Please keep your appointments with your specialists as you may have planned  Please go to the LAB in the Basement (turn left off the elevator) for the tests to be done today  You will be contacted by phone if any changes need to be made immediately.  Otherwise, you will receive a letter about your results with an explanation, but please check with MyChart first.  Please remember to sign up for MyChart if you have not done so, as this will be important to you in the future with finding out test results, communicating by private email, and scheduling acute appointments online when needed.  Please return in 6 months, or sooner if needed, with Lab testing done 3-5 days before

## 2017-09-25 NOTE — Telephone Encounter (Signed)
-----   Message from Biagio Borg, MD sent at 09/25/2017  1:28 PM EDT ----- Left message on MyChart, pt to cont same tx except  The test results show that your current treatment is OK, except the B12 level is low again.  Please take the shot treatment, or at the least, please take OTC B12 vitamin daily     [place] to please inform pt

## 2017-09-25 NOTE — Assessment & Plan Note (Signed)
stable overall by history and exam, recent data reviewed with pt, and pt to continue medical treatment as before,  to f/u any worsening symptoms or concerns Lab Results  Component Value Date   LDLCALC 81 09/25/2017

## 2017-09-25 NOTE — Assessment & Plan Note (Signed)
Persistent uncontrolled, to change the losartan 50 to losartan 100/12.5 qd,  to f/u any worsening symptoms or concerns

## 2017-09-26 IMAGING — CR DG ANKLE COMPLETE 3+V*L*
3 series · 3 of 3 positions shown · non-contrast
Comparison: None

CLINICAL DATA: Left ankle pain.

EXAM:
LEFT ANKLE COMPLETE - 3+ VIEW

[x ankle ap left]
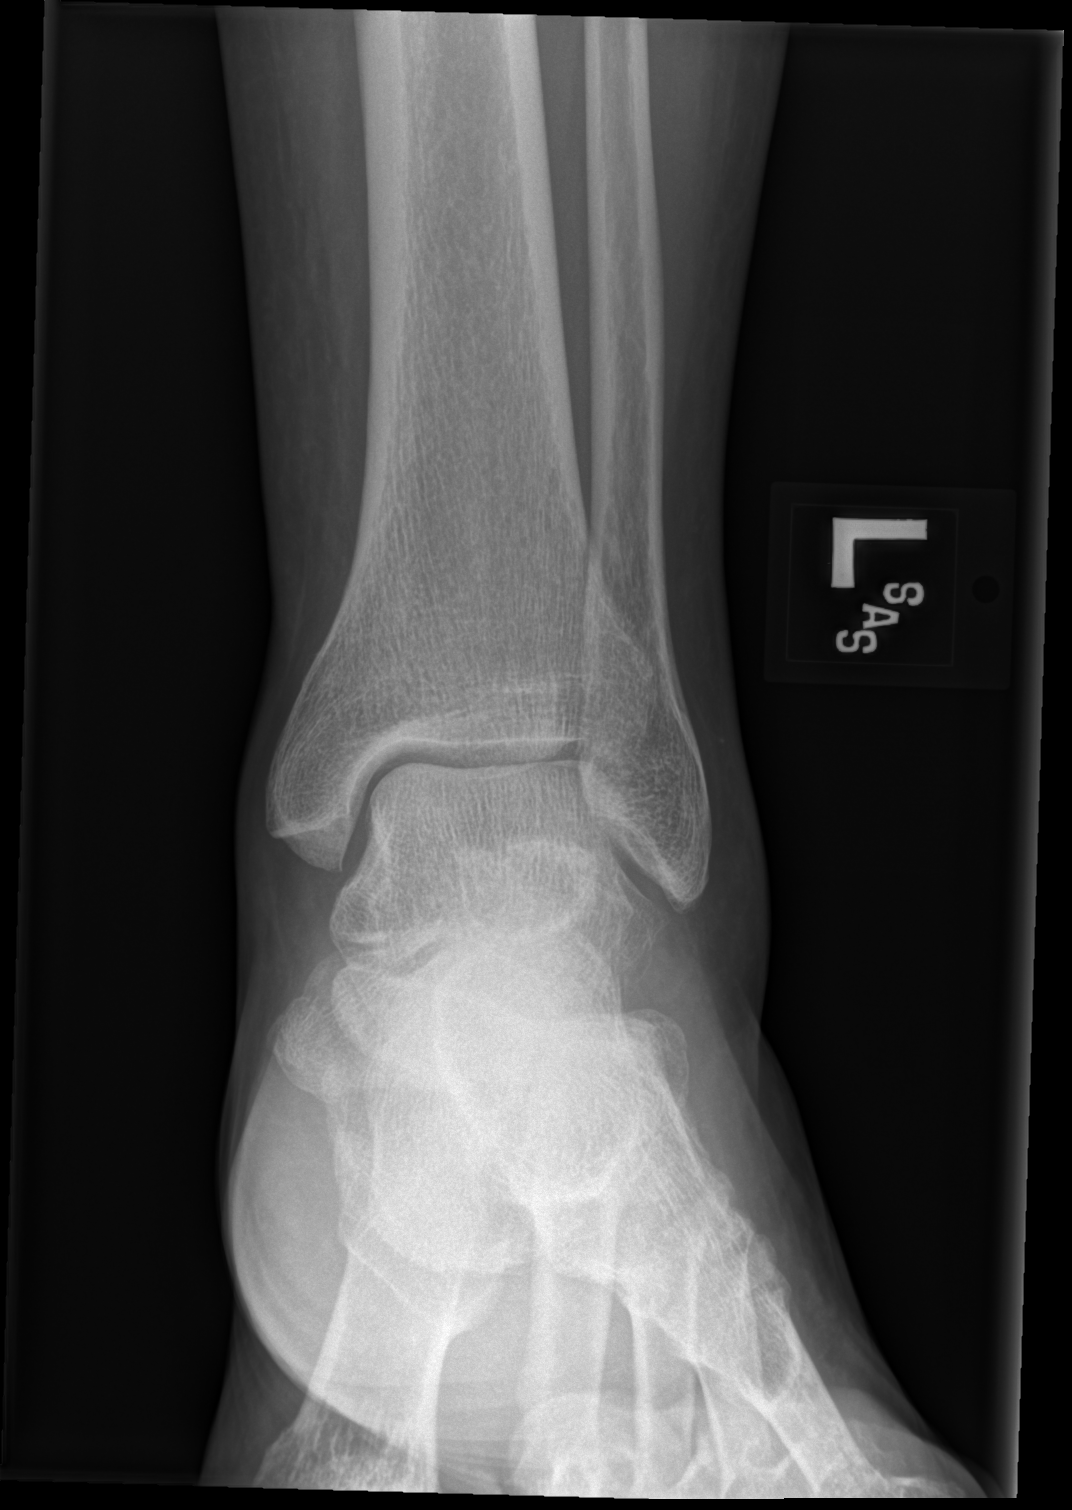

[x ankle obl left]
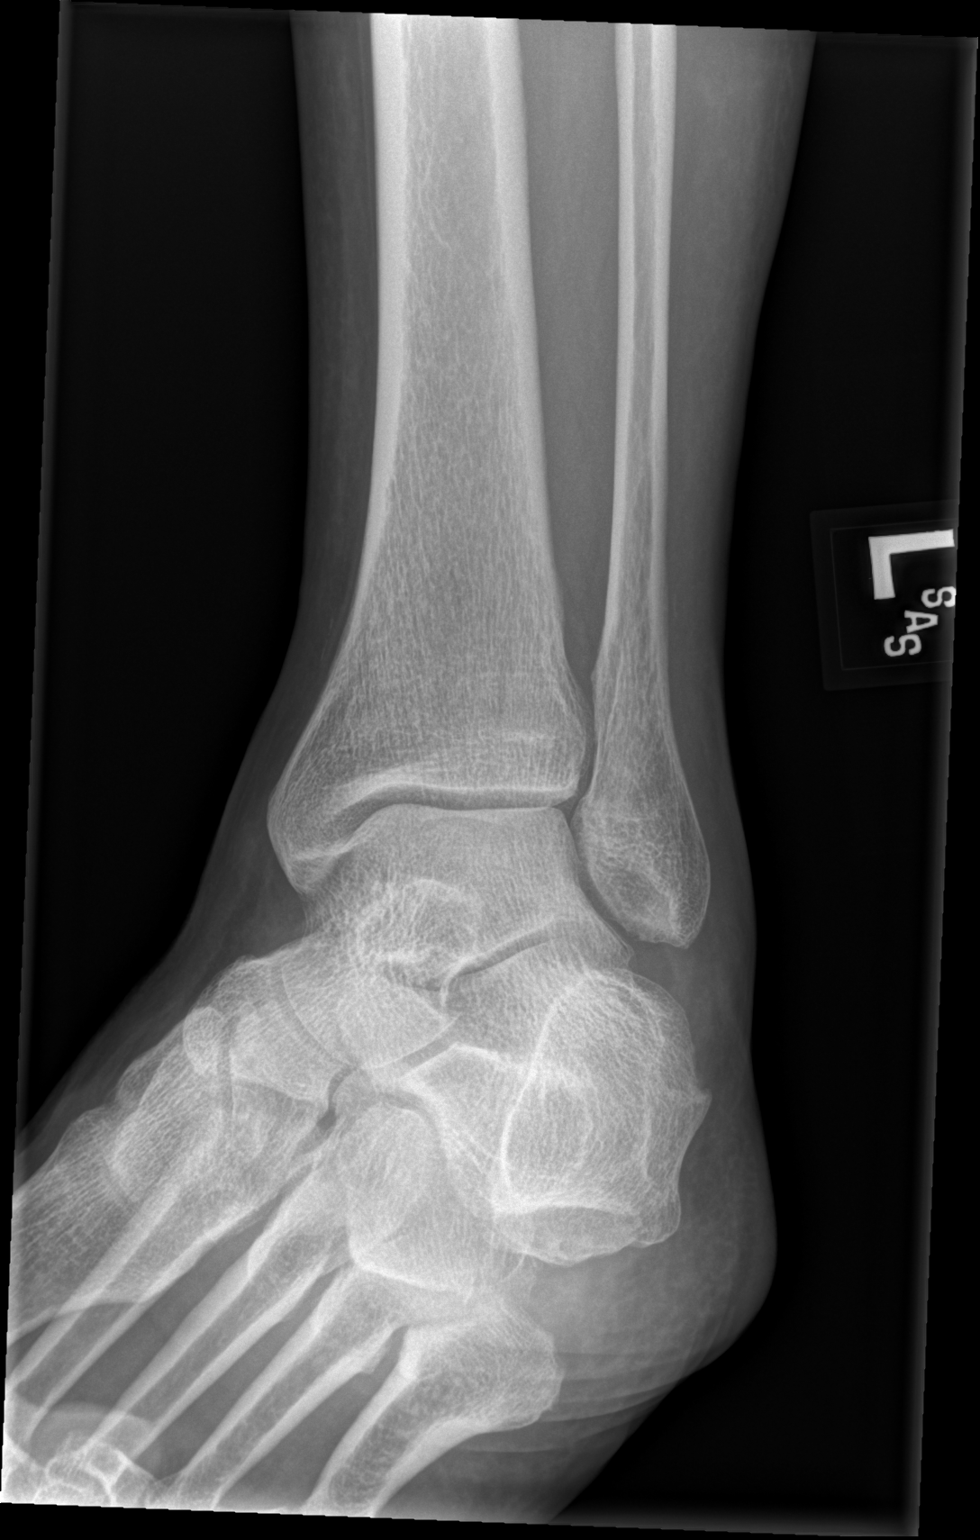

[x ankle lat left]
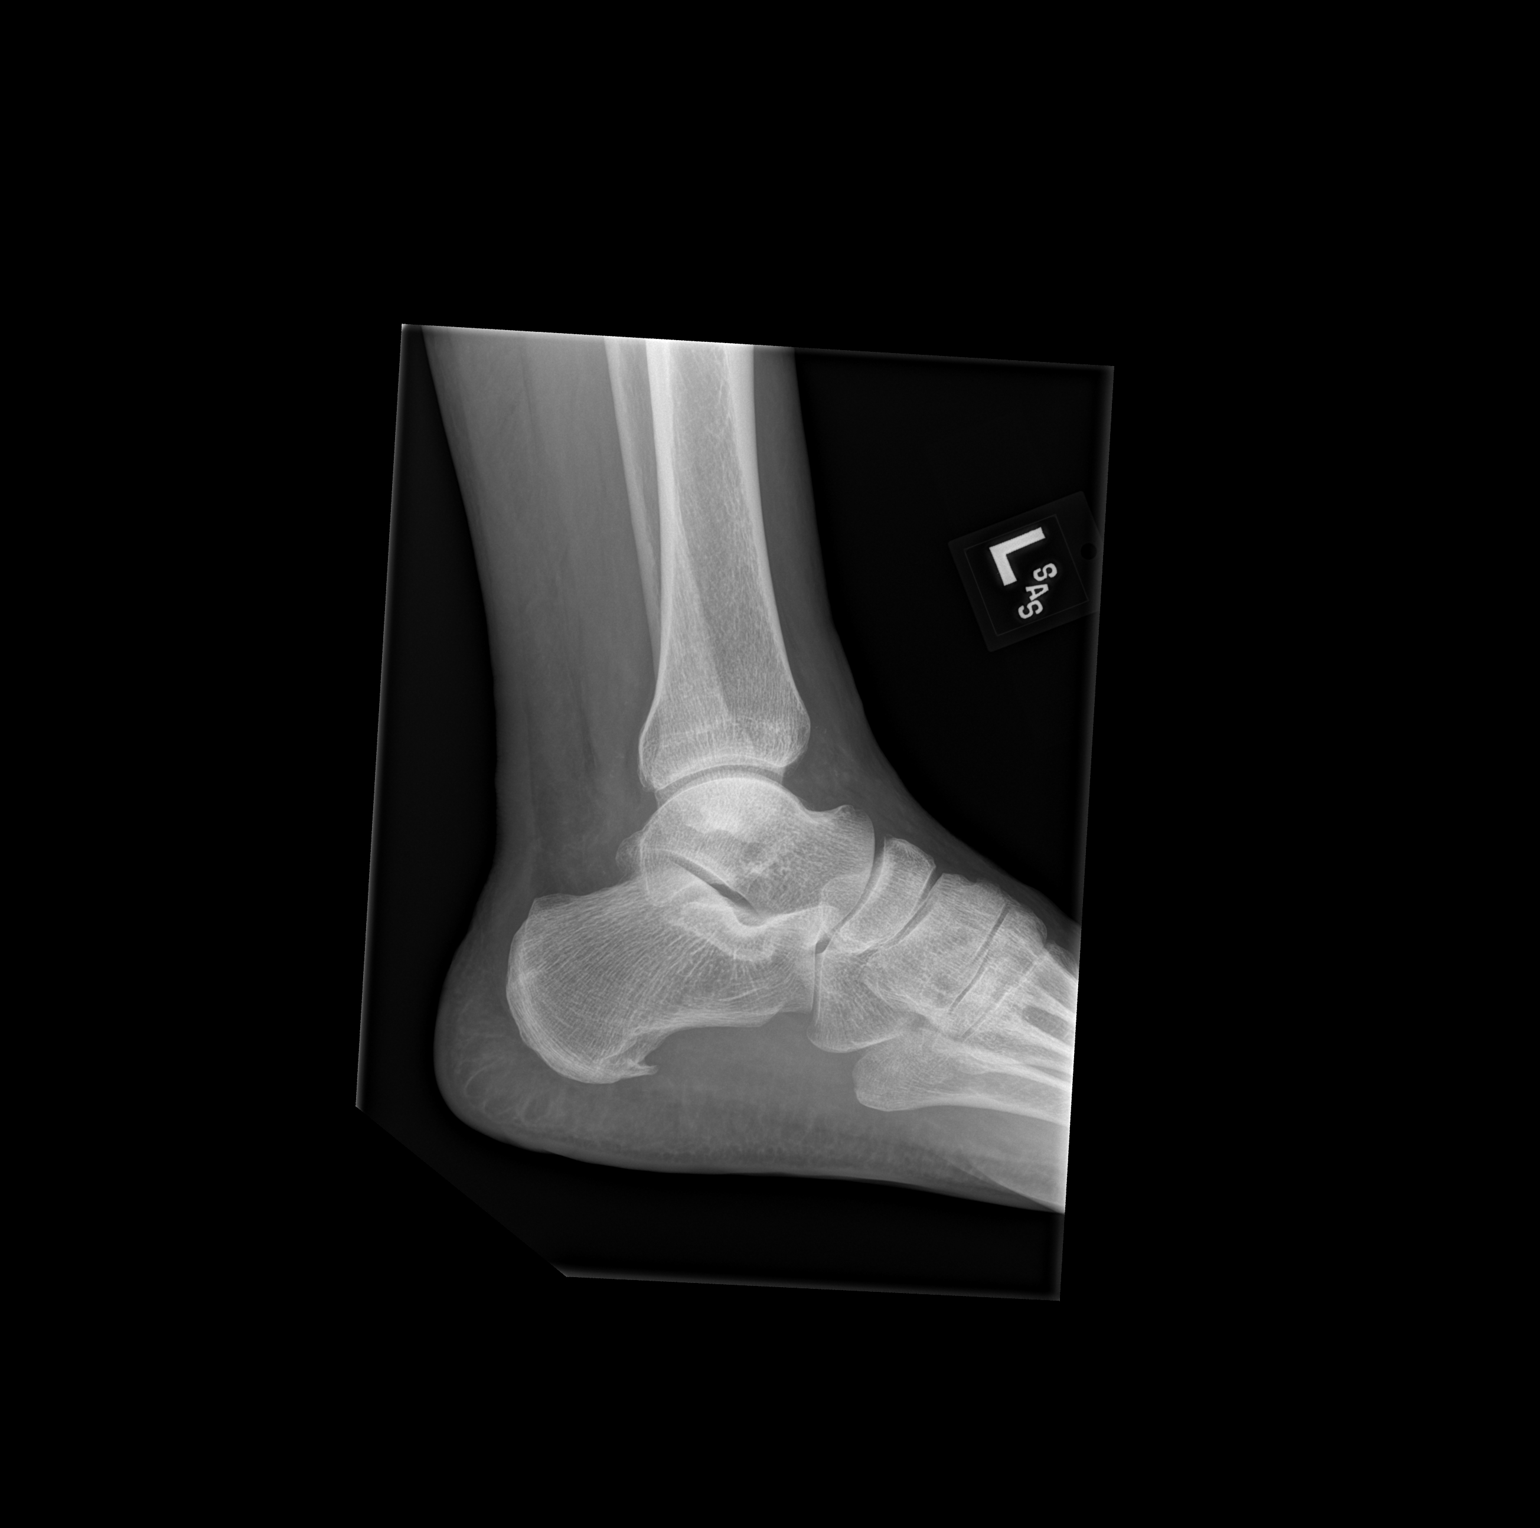

[3 of 3 positions shown; findings below may reference images not displayed]

FINDINGS: There is no evidence of fracture, dislocation, or joint effusion.
Small plantar heel spur. There is no evidence of arthropathy or
other focal bone abnormality. Soft tissues are unremarkable.
IMPRESSION: Negative.

## 2017-09-29 ENCOUNTER — Ambulatory Visit: Payer: Federal, State, Local not specified - PPO

## 2017-10-01 ENCOUNTER — Other Ambulatory Visit: Payer: Self-pay | Admitting: Internal Medicine

## 2017-10-07 ENCOUNTER — Ambulatory Visit (INDEPENDENT_AMBULATORY_CARE_PROVIDER_SITE_OTHER): Payer: Federal, State, Local not specified - PPO | Admitting: General Practice

## 2017-10-07 DIAGNOSIS — E538 Deficiency of other specified B group vitamins: Secondary | ICD-10-CM | POA: Diagnosis not present

## 2017-10-07 MED ORDER — CYANOCOBALAMIN 1000 MCG/ML IJ SOLN
1000.0000 ug | Freq: Once | INTRAMUSCULAR | Status: AC
  Administered 2017-10-07: 1000 ug via INTRAMUSCULAR

## 2017-10-16 ENCOUNTER — Encounter: Payer: Self-pay | Admitting: Internal Medicine

## 2017-11-04 ENCOUNTER — Encounter: Payer: Self-pay | Admitting: Internal Medicine

## 2017-11-11 ENCOUNTER — Ambulatory Visit (INDEPENDENT_AMBULATORY_CARE_PROVIDER_SITE_OTHER): Payer: Federal, State, Local not specified - PPO

## 2017-11-11 DIAGNOSIS — E538 Deficiency of other specified B group vitamins: Secondary | ICD-10-CM

## 2017-11-11 MED ORDER — CYANOCOBALAMIN 1000 MCG/ML IJ SOLN
1000.0000 ug | Freq: Once | INTRAMUSCULAR | Status: AC
  Administered 2017-11-11: 1000 ug via INTRAMUSCULAR

## 2017-11-11 NOTE — Progress Notes (Signed)
Medical screening examination/treatment/procedure(s) were performed by non-physician practitioner and as supervising physician I was immediately available for consultation/collaboration. I agree with above. Dillion Stowers, MD   

## 2017-12-10 ENCOUNTER — Ambulatory Visit (INDEPENDENT_AMBULATORY_CARE_PROVIDER_SITE_OTHER): Payer: Federal, State, Local not specified - PPO | Admitting: *Deleted

## 2017-12-10 DIAGNOSIS — E538 Deficiency of other specified B group vitamins: Secondary | ICD-10-CM | POA: Diagnosis not present

## 2017-12-10 MED ORDER — CYANOCOBALAMIN 1000 MCG/ML IJ SOLN
1000.0000 ug | Freq: Once | INTRAMUSCULAR | Status: AC
  Administered 2017-12-10: 1000 ug via INTRAMUSCULAR

## 2018-01-05 ENCOUNTER — Other Ambulatory Visit: Payer: Self-pay | Admitting: Internal Medicine

## 2018-01-12 ENCOUNTER — Ambulatory Visit: Payer: Federal, State, Local not specified - PPO

## 2018-03-07 ENCOUNTER — Other Ambulatory Visit: Payer: Self-pay | Admitting: Internal Medicine

## 2018-03-30 ENCOUNTER — Ambulatory Visit (INDEPENDENT_AMBULATORY_CARE_PROVIDER_SITE_OTHER): Payer: Federal, State, Local not specified - PPO | Admitting: Internal Medicine

## 2018-03-30 ENCOUNTER — Encounter: Payer: Federal, State, Local not specified - PPO | Admitting: Internal Medicine

## 2018-03-30 ENCOUNTER — Other Ambulatory Visit (INDEPENDENT_AMBULATORY_CARE_PROVIDER_SITE_OTHER): Payer: Federal, State, Local not specified - PPO

## 2018-03-30 ENCOUNTER — Ambulatory Visit (INDEPENDENT_AMBULATORY_CARE_PROVIDER_SITE_OTHER)
Admission: RE | Admit: 2018-03-30 | Discharge: 2018-03-30 | Disposition: A | Discharge status: Home / Self Care | Payer: Federal, State, Local not specified - PPO | Source: Ambulatory Visit | Attending: Internal Medicine | Admitting: Internal Medicine

## 2018-03-30 ENCOUNTER — Encounter: Payer: Self-pay | Admitting: Internal Medicine

## 2018-03-30 VITALS — BP 132/86 | HR 111 | Temp 98.5°F | Ht 66.0 in | Wt 205.0 lb

## 2018-03-30 DIAGNOSIS — Z0001 Encounter for general adult medical examination with abnormal findings: Secondary | ICD-10-CM

## 2018-03-30 DIAGNOSIS — Z0000 Encounter for general adult medical examination without abnormal findings: Secondary | ICD-10-CM

## 2018-03-30 DIAGNOSIS — J45909 Unspecified asthma, uncomplicated: Secondary | ICD-10-CM | POA: Diagnosis not present

## 2018-03-30 DIAGNOSIS — R739 Hyperglycemia, unspecified: Secondary | ICD-10-CM

## 2018-03-30 DIAGNOSIS — J4520 Mild intermittent asthma, uncomplicated: Secondary | ICD-10-CM

## 2018-03-30 DIAGNOSIS — E538 Deficiency of other specified B group vitamins: Secondary | ICD-10-CM

## 2018-03-30 DIAGNOSIS — Z23 Encounter for immunization: Secondary | ICD-10-CM

## 2018-03-30 LAB — LIPID PANEL
Cholesterol: 161 mg/dL (ref 0–200)
HDL: 40.5 mg/dL (ref >39.00)
NonHDL: 120.96
Total CHOL/HDL Ratio: 4
Triglycerides: 217 mg/dL — ABNORMAL HIGH (ref 0.0–149.0)
VLDL: 43.4 mg/dL — ABNORMAL HIGH (ref 0.0–40.0)

## 2018-03-30 LAB — CBC WITH DIFFERENTIAL/PLATELET
Basophils Absolute: 0 10*3/uL (ref 0.0–0.1)
Basophils Relative: 0.2 % (ref 0.0–3.0)
Eosinophils Absolute: 0.1 10*3/uL (ref 0.0–0.7)
Eosinophils Relative: 1 % (ref 0.0–5.0)
HCT: 34.9 % — ABNORMAL LOW (ref 39.0–52.0)
Hemoglobin: 11.4 g/dL — ABNORMAL LOW (ref 13.0–17.0)
Lymphocytes Relative: 27.8 % (ref 12.0–46.0)
Lymphs Abs: 2.8 10*3/uL (ref 0.7–4.0)
MCHC: 32.7 g/dL (ref 30.0–36.0)
MCV: 85.5 fl (ref 78.0–100.0)
Monocytes Absolute: 0.6 10*3/uL (ref 0.1–1.0)
Monocytes Relative: 5.6 % (ref 3.0–12.0)
Neutro Abs: 6.5 10*3/uL (ref 1.4–7.7)
Neutrophils Relative %: 65.4 % (ref 43.0–77.0)
Platelets: 246 10*3/uL (ref 150.0–400.0)
RBC: 4.08 Mil/uL — ABNORMAL LOW (ref 4.22–5.81)
RDW: 14.4 % (ref 11.5–15.5)
WBC: 9.9 10*3/uL (ref 4.0–10.5)

## 2018-03-30 LAB — URINALYSIS, ROUTINE W REFLEX MICROSCOPIC
Bilirubin Urine: NEGATIVE
Hgb urine dipstick: NEGATIVE
Ketones, ur: NEGATIVE
Leukocytes, UA: NEGATIVE
Nitrite: NEGATIVE
RBC / HPF: NONE SEEN (ref 0–?)
Specific Gravity, Urine: 1.025 (ref 1.000–1.030)
Total Protein, Urine: NEGATIVE
Urine Glucose: NEGATIVE
Urobilinogen, UA: 0.2 (ref 0.0–1.0)
pH: 6 (ref 5.0–8.0)

## 2018-03-30 LAB — HEPATIC FUNCTION PANEL
ALT: 13 U/L (ref 0–53)
AST: 16 U/L (ref 0–37)
Albumin: 4.3 g/dL (ref 3.5–5.2)
Alkaline Phosphatase: 55 U/L (ref 39–117)
Bilirubin, Direct: 0.1 mg/dL (ref 0.0–0.3)
Total Bilirubin: 0.4 mg/dL (ref 0.2–1.2)
Total Protein: 7 g/dL (ref 6.0–8.3)

## 2018-03-30 LAB — BASIC METABOLIC PANEL
BUN: 12 mg/dL (ref 6–23)
CO2: 28 mEq/L (ref 19–32)
Calcium: 9.2 mg/dL (ref 8.4–10.5)
Chloride: 105 mEq/L (ref 96–112)
Creatinine, Ser: 1.73 mg/dL — ABNORMAL HIGH (ref 0.40–1.50)
GFR: 51.13 mL/min — ABNORMAL LOW (ref >60.00)
Glucose, Bld: 93 mg/dL (ref 70–99)
Potassium: 4.3 mEq/L (ref 3.5–5.1)
Sodium: 140 mEq/L (ref 135–145)

## 2018-03-30 LAB — HEMOGLOBIN A1C: Hgb A1c MFr Bld: 6.3 % (ref 4.6–6.5)

## 2018-03-30 LAB — PSA: PSA: 1.29 ng/mL (ref 0.10–4.00)

## 2018-03-30 LAB — TSH: TSH: 2.46 u[IU]/mL (ref 0.35–4.50)

## 2018-03-30 LAB — LDL CHOLESTEROL, DIRECT: Direct LDL: 89 mg/dL

## 2018-03-30 MED ORDER — CYANOCOBALAMIN 1000 MCG/ML IJ SOLN
1000.0000 ug | Freq: Once | INTRAMUSCULAR | Status: AC
  Administered 2018-03-30: 1000 ug via INTRAMUSCULAR

## 2018-03-30 NOTE — Assessment & Plan Note (Signed)

## 2018-03-30 NOTE — Assessment & Plan Note (Signed)
For b12 today IM then monthly b12 nurse visits

## 2018-03-30 NOTE — Assessment & Plan Note (Signed)
stable overall by history and exam, recent data reviewed with pt, and pt to continue medical treatment as before,  to f/u any worsening symptoms or concerns Lab Results  Component Value Date   HGBA1C 6.3 03/30/2018

## 2018-03-30 NOTE — Assessment & Plan Note (Signed)
Pt with isolated right wheeze today, for cxr, but suspect asymmetric asthma, but need r/o mass effect

## 2018-03-30 NOTE — Progress Notes (Signed)
Subjective:    Patient ID: Darrell Hoffman, male    DOB: 1951/04/25, 67 y.o.   MRN: [id number]  HPI  Here for wellness and f/u;  Overall doing ok;  Pt denies Chest pain, worsening SOB, DOE, wheezing, orthopnea, PND, worsening LE edema, palpitations, dizziness or syncope.  Pt denies neurological change such as new headache, facial or extremity weakness.  Pt denies polydipsia, polyuria, or low sugar symptoms. Pt states overall good compliance with treatment and medications, good tolerability, and has been trying to follow appropriate diet.  Pt denies worsening depressive symptoms, suicidal ideation or panic. No fever, night sweats, wt loss, loss of appetite, or other constitutional symptoms.  Pt states good ability with ADL's, has low fall risk, home safety reviewed and adequate, no other significant changes in hearing or vision, and only occasionally active with exercise. Admits to b12 noncompliance with shots or OTC, plans to be better with making his appts.  Lost several lbs with better diet.    Wt Readings from Last 3 Encounters:  03/30/18 205 lb (93 kg)  09/25/17 212 lb (96.2 kg)  03/27/17 213 lb (96.6 kg)   Past Medical History:  Diagnosis Date  . ALCOHOL ABUSE, HX OF 10/03/2007  . ALLERGIC RHINITIS 10/03/2007  . ANEMIA-B12 DEFICIENCY 12/04/2009  . ANEMIA-IRON DEFICIENCY 11/27/2009  . ANEMIA-NOS 10/08/2007  . ASTHMA 10/03/2007  . B12 DEFICIENCY 10/06/2008  . COLONIC POLYPS, HX OF 10/03/2007  . ERECTILE DYSFUNCTION 10/03/2007  . GERD 10/03/2007  . HYPERLIPIDEMIA 10/03/2007  . HYPERTENSION 10/03/2007  . KNEE PAIN, RIGHT 10/08/2007  . Morbid obesity ([place]) 10/03/2007  . Substance abuse ([place])    marijuana daily   Past Surgical History:  Procedure Laterality Date  . bilateral hydrocele repair    . COLONOSCOPY    . POLYPECTOMY  01-03-2010  . s/p left knee arthroscopy  2009  . s/p TKR  03/2009  . VASECTOMY      reports that he has never smoked. He has never used smokeless tobacco. He  reports that he drinks alcohol. He reports that he has current or past drug history. family history includes Alcohol abuse in his paternal uncle; Breast cancer in his mother; Coronary artery disease in his other; Diabetes in his cousin and sister; Hypertension in his sister; Lupus in his sister. No Known Allergies Current Outpatient Medications on File Prior to Visit  Medication Sig Dispense Refill  . ADVAIR DISKUS 500-50 MCG/DOSE AEPB INHALE 1 PUFF BY MOUTH TWICE DAILY 60 each 2  . albuterol (PROVENTIL HFA) 108 (90 Base) MCG/ACT inhaler Inhale 2 puffs into the lungs every 4 (four) hours as needed. 3 Inhaler 0  . ASPIRIN LOW DOSE 81 MG EC tablet TAKE 1 TABLET BY MOUTH DAILY 90 tablet 0  . CIALIS 20 MG tablet TAKE 1 TABLET BY MOUTH DAILY AS NEEDED 5 tablet 11  . cyanocobalamin (,VITAMIN B-12,) 1000 MCG/ML injection Inject 1,000 mcg into the muscle. Inject 1 ML IM once per month     . diclofenac sodium (VOLTAREN) 1 % GEL Apply 4 g topically 4 (four) times daily as needed. 400 g 11  . indomethacin (INDOCIN) 50 MG capsule Take 1 capsule (50 mg total) by mouth 2 (two) times daily with a meal. 10 capsule 0  . losartan-hydrochlorothiazide (HYZAAR) 100-12.5 MG tablet Take 1 tablet by mouth daily. 90 tablet 3  . omeprazole (PRILOSEC) 20 MG capsule TAKE ONE CAPSULE BY MOUTH TWICE DAILY 60 capsule 11  . triamcinolone (NASACORT AQ) 55 MCG/ACT AERO  nasal inhaler Place 2 sprays into the nose daily. 1 Inhaler 12   No current facility-administered medications on file prior to visit.    Review of Systems Constitutional: Negative for other unusual diaphoresis, sweats, appetite or weight changes HENT: Negative for other worsening hearing loss, ear pain, facial swelling, mouth sores or neck stiffness.   Eyes: Negative for other worsening pain, redness or other visual disturbance.  Respiratory: Negative for other stridor or swelling Cardiovascular: Negative for other palpitations or other chest pain    Gastrointestinal: Negative for worsening diarrhea or loose stools, blood in stool, distention or other pain Genitourinary: Negative for hematuria, flank pain or other change in urine volume.  Musculoskeletal: Negative for myalgias or other joint swelling.  Skin: Negative for other color change, or other wound or worsening drainage.  Neurological: Negative for other syncope or numbness. Hematological: Negative for other adenopathy or swelling Psychiatric/Behavioral: Negative for hallucinations, other worsening agitation, SI, self-injury, or new decreased concentration All other system neg per pt    Objective:   Physical Exam BP 132/86   Pulse (!) 111   Temp 98.5 F (36.9 C) (Oral)   Ht 5\' 6"  (1.676 m)   Wt 205 lb (93 kg)   SpO2 98%   BMI 33.09 kg/m  VS noted,  Constitutional: Pt is oriented to person, place, and time. Appears well-developed and well-nourished, in no significant distress and comfortable Head: Normocephalic and atraumatic  Eyes: Conjunctivae and EOM are normal. Pupils are equal, round, and reactive to light Right Ear: External ear normal without discharge Left Ear: External ear normal without discharge Nose: Nose without discharge or deformity Mouth/Throat: Oropharynx is without other ulcerations and moist  Neck: Normal range of motion. Neck supple. No JVD present. No tracheal deviation present or significant neck LA or mass Cardiovascular: Normal rate, regular rhythm, normal heart sounds and intact distal pulses.   Pulmonary/Chest: WOB normal and breath sounds without rales or wheezing  Abdominal: Soft. Bowel sounds are normal. NT. No HSM  Musculoskeletal: Normal range of motion. Exhibits no edema Lymphadenopathy: Has no other cervical adenopathy.  Neurological: Pt is alert and oriented to person, place, and time. Pt has normal reflexes. No cranial nerve deficit. Motor grossly intact, Gait intact Skin: Skin is warm and dry. No rash noted or new  ulcerations Psychiatric:  Has normal mood and affect. Behavior is normal without agitation No other exam findings Lab Results  Component Value Date   WBC 9.9 03/30/2018   HGB 11.4 (L) 03/30/2018   HCT 34.9 (L) 03/30/2018   PLT 246.0 03/30/2018   GLUCOSE 93 03/30/2018   CHOL 161 03/30/2018   TRIG 217.0 (H) 03/30/2018   HDL 40.50 03/30/2018   LDLDIRECT 89.0 03/30/2018   LDLCALC 81 09/25/2017   ALT 13 03/30/2018   AST 16 03/30/2018   NA 140 03/30/2018   K 4.3 03/30/2018   CL 105 03/30/2018   CREATININE 1.73 (H) 03/30/2018   BUN 12 03/30/2018   CO2 28 03/30/2018   TSH 2.46 03/30/2018   PSA 1.29 03/30/2018   INR 2.5 (H) 04/23/2009   HGBA1C 6.3 03/30/2018      Assessment & Plan:

## 2018-03-30 NOTE — Patient Instructions (Addendum)
You had the Prevnar pneumonia shot today, and the B12 shot  Please make a Nurse Appt at your convenience monthly at 330 PM, but let them make a note you would not be able to show until 430 due to work hours  Please continue all other medications as before, and refills have been done if requested.  Please have the pharmacy call with any other refills you may need.  Please continue your efforts at being more active, low cholesterol diet, and weight control.  You are otherwise up to date with prevention measures today.  Please keep your appointments with your specialists as you may have planned  Please go to the XRAY Department in the Basement (go straight as you get off the elevator) for the x-ray testing  Please go to the LAB in the Basement (turn left off the elevator) for the tests to be done today  You will be contacted by phone if any changes need to be made immediately.  Otherwise, you will receive a letter about your results with an explanation, but please check with MyChart first.  Please remember to sign up for MyChart if you have not done so, as this will be important to you in the future with finding out test results, communicating by private email, and scheduling acute appointments online when needed.  Please return in 6 months, or sooner if needed

## 2018-03-31 ENCOUNTER — Encounter: Payer: Self-pay | Admitting: Internal Medicine

## 2018-03-31 DIAGNOSIS — N183 Chronic kidney disease, stage 3 unspecified: Secondary | ICD-10-CM

## 2018-03-31 HISTORY — DX: Chronic kidney disease, stage 3 unspecified: N18.30

## 2018-04-19 ENCOUNTER — Other Ambulatory Visit: Payer: Self-pay | Admitting: Internal Medicine

## 2018-04-30 ENCOUNTER — Ambulatory Visit: Payer: Federal, State, Local not specified - PPO

## 2018-05-12 ENCOUNTER — Telehealth: Payer: Self-pay | Admitting: Internal Medicine

## 2018-05-12 NOTE — Telephone Encounter (Signed)
Patient came by the office and dropped off FMLA forms to be completed. He said that this is for asthma and is needing the frequency to be 2-3 times a month. Patient would like a call when it is done.  Placed in Brittany's box for completion.

## 2018-05-13 ENCOUNTER — Ambulatory Visit (INDEPENDENT_AMBULATORY_CARE_PROVIDER_SITE_OTHER): Payer: Self-pay

## 2018-05-13 ENCOUNTER — Telehealth: Payer: Self-pay

## 2018-05-13 DIAGNOSIS — E538 Deficiency of other specified B group vitamins: Secondary | ICD-10-CM

## 2018-05-13 DIAGNOSIS — Z0279 Encounter for issue of other medical certificate: Secondary | ICD-10-CM

## 2018-05-13 MED ORDER — CYANOCOBALAMIN 1000 MCG/ML IJ SOLN
1000.0000 ug | Freq: Once | INTRAMUSCULAR | Status: AC
  Administered 2018-05-13: 1000 ug via INTRAMUSCULAR

## 2018-05-13 NOTE — Telephone Encounter (Signed)
Forms have been completed & placed in providers box to review and sign.  °

## 2018-05-13 NOTE — Telephone Encounter (Signed)
Ok with me 

## 2018-05-13 NOTE — Telephone Encounter (Signed)
Patient is requesting a renewal on his FMLA, we have completed in the past.   LOV: 03/30/18  Please advise if you approve. Thank you.

## 2018-05-13 NOTE — Progress Notes (Signed)
Medical screening examination/treatment/procedure(s) were performed by non-physician practitioner and as supervising physician I was immediately available for consultation/collaboration. I agree with above. Lamon Rotundo, MD   

## 2018-05-13 NOTE — Telephone Encounter (Signed)
Ok for patient to restart b12 injections without drawing a lab per dr Jenny Reichmann

## 2018-05-14 NOTE — Telephone Encounter (Signed)
Forms have been signed, Faxed to [place] [employer] FMLA specialist @ [phone], Copy sent to scan & charged for.   LVM to inform patient the original is ready to be picked up.

## 2018-06-15 ENCOUNTER — Other Ambulatory Visit: Payer: Self-pay | Admitting: Internal Medicine

## 2018-06-16 ENCOUNTER — Telehealth: Payer: Self-pay

## 2018-06-16 NOTE — Telephone Encounter (Signed)
Ok for monthly B12 injections to continu

## 2018-06-16 NOTE — Telephone Encounter (Signed)
Dr. Jenny Reichmann please advise. Ok to continue?  Copied from [place] [phone]. Topic: Appointment Scheduling - Scheduling Inquiry for Clinic >> Jun 16, 2018  8:54 AM Synthia Innocent wrote: Reason for CRM: Patient requesting B12 injection, wanting to come in for a 4:30 appt time, please advise  >> Jun 16, 2018  8:58 AM [place], Isidoro Donning wrote: Faythe Ghee to continue B12 injections?

## 2018-06-16 NOTE — Telephone Encounter (Signed)
Patient scheduled for 06/17/18

## 2018-06-16 NOTE — Telephone Encounter (Signed)
Left message for patient to schedule appointment. Okay to schedule at 4:15/4:30 like in the past. Appointment would need to be put at the latest time on the nurse schedule with a note stating what time the patient will be here.

## 2018-06-17 ENCOUNTER — Ambulatory Visit (INDEPENDENT_AMBULATORY_CARE_PROVIDER_SITE_OTHER): Payer: Federal, State, Local not specified - PPO

## 2018-06-17 DIAGNOSIS — E538 Deficiency of other specified B group vitamins: Secondary | ICD-10-CM

## 2018-06-17 MED ORDER — CYANOCOBALAMIN 1000 MCG/ML IJ SOLN
1000.0000 ug | Freq: Once | INTRAMUSCULAR | Status: AC
  Administered 2018-06-17: 1000 ug via INTRAMUSCULAR

## 2018-06-17 NOTE — Progress Notes (Signed)
Medical screening examination/treatment/procedure(s) were performed by non-physician practitioner and as supervising physician I was immediately available for consultation/collaboration. I agree with above. James John, MD   

## 2018-08-06 ENCOUNTER — Ambulatory Visit (INDEPENDENT_AMBULATORY_CARE_PROVIDER_SITE_OTHER): Payer: Federal, State, Local not specified - PPO

## 2018-08-06 ENCOUNTER — Telehealth: Payer: Self-pay

## 2018-08-06 DIAGNOSIS — E538 Deficiency of other specified B group vitamins: Secondary | ICD-10-CM | POA: Diagnosis not present

## 2018-08-06 MED ORDER — CYANOCOBALAMIN 1000 MCG/ML IJ SOLN
1000.0000 ug | Freq: Once | INTRAMUSCULAR | Status: AC
  Administered 2018-08-06: 1000 ug via INTRAMUSCULAR

## 2018-08-06 NOTE — Telephone Encounter (Signed)
Copied from [place] [id number]. Topic: Appointment Scheduling - Scheduling Inquiry for Clinic >> Aug 06, 2018  9:16 AM Reyne Dumas L wrote: Reason for CRM:  Pt calling to see if he can come in for a B12 injection after 4:30pm. Pt can be reached at [phone]  Left message advising patient that we can schedule today at 4:45 for b12 injection, typically nurse visits need to be done by 4pm each day, I cannot guarantee that I will have extra staffing for nurse visits after 4pm each day

## 2018-09-10 ENCOUNTER — Other Ambulatory Visit: Payer: Self-pay | Admitting: Internal Medicine

## 2018-09-22 ENCOUNTER — Telehealth: Payer: Self-pay | Admitting: Internal Medicine

## 2018-09-22 NOTE — Telephone Encounter (Signed)
This has been added to his appointment he had on 10/15. Spoke with patient.

## 2018-09-22 NOTE — Telephone Encounter (Signed)
Copied from [place] [id number]. Topic: Quick Communication - See Telephone Encounter >> Sep 22, 2018 11:09 AM Alfredia Ferguson R wrote: Patient called in wanting to schedule his cyanocobalamin (,VITAMIN B-12,) 1000 MCG/ML injection

## 2018-09-23 ENCOUNTER — Other Ambulatory Visit (INDEPENDENT_AMBULATORY_CARE_PROVIDER_SITE_OTHER): Payer: Federal, State, Local not specified - PPO

## 2018-09-23 DIAGNOSIS — Z0001 Encounter for general adult medical examination with abnormal findings: Secondary | ICD-10-CM

## 2018-09-23 DIAGNOSIS — R739 Hyperglycemia, unspecified: Secondary | ICD-10-CM | POA: Diagnosis not present

## 2018-09-23 LAB — BASIC METABOLIC PANEL
BUN: 17 mg/dL (ref 6–23)
CO2: 31 mEq/L (ref 19–32)
Calcium: 9.3 mg/dL (ref 8.4–10.5)
Chloride: 104 mEq/L (ref 96–112)
Creatinine, Ser: 1.9 mg/dL — ABNORMAL HIGH (ref 0.40–1.50)
GFR: 45.82 mL/min — ABNORMAL LOW (ref >60.00)
Glucose, Bld: 92 mg/dL (ref 70–99)
Potassium: 4.4 mEq/L (ref 3.5–5.1)
Sodium: 140 mEq/L (ref 135–145)

## 2018-09-23 LAB — HEPATIC FUNCTION PANEL
ALT: 13 U/L (ref 0–53)
AST: 17 U/L (ref 0–37)
Albumin: 4.2 g/dL (ref 3.5–5.2)
Alkaline Phosphatase: 48 U/L (ref 39–117)
Bilirubin, Direct: 0.1 mg/dL (ref 0.0–0.3)
Total Bilirubin: 0.5 mg/dL (ref 0.2–1.2)
Total Protein: 6.8 g/dL (ref 6.0–8.3)

## 2018-09-23 LAB — CBC WITH DIFFERENTIAL/PLATELET
Basophils Absolute: 0.1 10*3/uL (ref 0.0–0.1)
Basophils Relative: 0.8 % (ref 0.0–3.0)
Eosinophils Absolute: 0.1 10*3/uL (ref 0.0–0.7)
Eosinophils Relative: 1.4 % (ref 0.0–5.0)
HCT: 32 % — ABNORMAL LOW (ref 39.0–52.0)
Hemoglobin: 10.5 g/dL — ABNORMAL LOW (ref 13.0–17.0)
Lymphocytes Relative: 29.9 % (ref 12.0–46.0)
Lymphs Abs: 2.5 10*3/uL (ref 0.7–4.0)
MCHC: 33 g/dL (ref 30.0–36.0)
MCV: 83.9 fl (ref 78.0–100.0)
Monocytes Absolute: 0.4 10*3/uL (ref 0.1–1.0)
Monocytes Relative: 5.1 % (ref 3.0–12.0)
Neutro Abs: 5.3 10*3/uL (ref 1.4–7.7)
Neutrophils Relative %: 62.8 % (ref 43.0–77.0)
Platelets: 246 10*3/uL (ref 150.0–400.0)
RBC: 3.82 Mil/uL — ABNORMAL LOW (ref 4.22–5.81)
RDW: 14 % (ref 11.5–15.5)
WBC: 8.5 10*3/uL (ref 4.0–10.5)

## 2018-09-23 LAB — LIPID PANEL
Cholesterol: 182 mg/dL (ref 0–200)
HDL: 37.4 mg/dL — ABNORMAL LOW (ref >39.00)
NonHDL: 144.6
Total CHOL/HDL Ratio: 5
Triglycerides: 327 mg/dL — ABNORMAL HIGH (ref 0.0–149.0)
VLDL: 65.4 mg/dL — ABNORMAL HIGH (ref 0.0–40.0)

## 2018-09-23 LAB — HEMOGLOBIN A1C: Hgb A1c MFr Bld: 6.2 % (ref 4.6–6.5)

## 2018-09-23 LAB — TSH: TSH: 1.51 u[IU]/mL (ref 0.35–4.50)

## 2018-09-23 LAB — LDL CHOLESTEROL, DIRECT: Direct LDL: 95 mg/dL

## 2018-09-23 LAB — PSA: PSA: 0.79 ng/mL (ref 0.10–4.00)

## 2018-09-24 LAB — URINALYSIS, ROUTINE W REFLEX MICROSCOPIC
Bilirubin Urine: NEGATIVE
Hgb urine dipstick: NEGATIVE
Ketones, ur: NEGATIVE
Leukocytes, UA: NEGATIVE
Nitrite: NEGATIVE
RBC / HPF: NONE SEEN (ref 0–?)
Specific Gravity, Urine: 1.015 (ref 1.000–1.030)
Total Protein, Urine: NEGATIVE
Urine Glucose: NEGATIVE
Urobilinogen, UA: 0.2 (ref 0.0–1.0)
pH: 6.5 (ref 5.0–8.0)

## 2018-09-29 ENCOUNTER — Ambulatory Visit: Payer: Federal, State, Local not specified - PPO | Admitting: Internal Medicine

## 2018-09-29 ENCOUNTER — Encounter: Payer: Self-pay | Admitting: Internal Medicine

## 2018-09-29 VITALS — BP 120/84 | HR 96 | Temp 98.8°F | Ht 66.0 in | Wt 202.0 lb

## 2018-09-29 DIAGNOSIS — N183 Chronic kidney disease, stage 3 unspecified: Secondary | ICD-10-CM

## 2018-09-29 DIAGNOSIS — I10 Essential (primary) hypertension: Secondary | ICD-10-CM | POA: Diagnosis not present

## 2018-09-29 DIAGNOSIS — R739 Hyperglycemia, unspecified: Secondary | ICD-10-CM

## 2018-09-29 DIAGNOSIS — E538 Deficiency of other specified B group vitamins: Secondary | ICD-10-CM

## 2018-09-29 DIAGNOSIS — E785 Hyperlipidemia, unspecified: Secondary | ICD-10-CM | POA: Diagnosis not present

## 2018-09-29 DIAGNOSIS — N289 Disorder of kidney and ureter, unspecified: Secondary | ICD-10-CM

## 2018-09-29 DIAGNOSIS — Z23 Encounter for immunization: Secondary | ICD-10-CM

## 2018-09-29 DIAGNOSIS — Z0000 Encounter for general adult medical examination without abnormal findings: Secondary | ICD-10-CM

## 2018-09-29 MED ORDER — CYANOCOBALAMIN 1000 MCG/ML IJ SOLN
1000.0000 ug | Freq: Once | INTRAMUSCULAR | Status: AC
  Administered 2018-09-29: 1000 ug via INTRAMUSCULAR

## 2018-09-29 NOTE — Assessment & Plan Note (Signed)
stable overall by history and exam, recent data reviewed with pt, and pt to continue medical treatment as before,  to f/u any worsening symptoms or concerns  

## 2018-09-29 NOTE — Progress Notes (Signed)
Subjective:    Patient ID: Darrell Hoffman, male    DOB: 08-31-1951, 67 y.o.   MRN: [id number]  HPI  Here to f/u; overall doing ok,  Pt denies chest pain, increasing sob or doe, wheezing, orthopnea, PND, increased LE swelling, palpitations, dizziness or syncope.  Pt denies new neurological symptoms such as new headache, or facial or extremity weakness or numbness.  Pt denies polydipsia, polyuria, or low sugar episode.  Pt states overall good compliance with meds, mostly trying to follow appropriate diet, with wt  -  Lost 10 lbs in past yr with better diet.   Wt Readings from Last 3 Encounters:  09/29/18 202 lb (91.6 kg)  03/30/18 205 lb (93 kg)  09/25/17 212 lb (96.2 kg)   Past Medical History:  Diagnosis Date  . ALCOHOL ABUSE, HX OF 10/03/2007  . ALLERGIC RHINITIS 10/03/2007  . ANEMIA-B12 DEFICIENCY 12/04/2009  . ANEMIA-IRON DEFICIENCY 11/27/2009  . ANEMIA-NOS 10/08/2007  . ASTHMA 10/03/2007  . B12 DEFICIENCY 10/06/2008  . CKD (chronic kidney disease) stage 3, GFR 30-59 ml/min ([facility]) 03/31/2018  . COLONIC POLYPS, HX OF 10/03/2007  . ERECTILE DYSFUNCTION 10/03/2007  . GERD 10/03/2007  . HYPERLIPIDEMIA 10/03/2007  . HYPERTENSION 10/03/2007  . KNEE PAIN, RIGHT 10/08/2007  . Morbid obesity ([place]) 10/03/2007  . Substance abuse ([place])    marijuana daily   Past Surgical History:  Procedure Laterality Date  . bilateral hydrocele repair    . COLONOSCOPY    . POLYPECTOMY  01-03-2010  . s/p left knee arthroscopy  2009  . s/p TKR  03/2009  . VASECTOMY      reports that he has never smoked. He has never used smokeless tobacco. He reports that he drinks alcohol. He reports that he has current or past drug history. family history includes Alcohol abuse in his paternal uncle; Breast cancer in his mother; Coronary artery disease in his other; Diabetes in his cousin and sister; Hypertension in his sister; Lupus in his sister. No Known Allergies Current Outpatient Medications on File Prior to Visit   Medication Sig Dispense Refill  . albuterol (PROVENTIL HFA) 108 (90 Base) MCG/ACT inhaler Inhale 2 puffs into the lungs every 4 (four) hours as needed. 3 Inhaler 0  . ASPIRIN LOW DOSE 81 MG EC tablet TAKE 1 TABLET BY MOUTH EVERY DAY 90 tablet 1  . CIALIS 20 MG tablet TAKE 1 TABLET BY MOUTH DAILY AS NEEDED 5 tablet 11  . cyanocobalamin (,VITAMIN B-12,) 1000 MCG/ML injection Inject 1,000 mcg into the muscle. Inject 1 ML IM once per month     . diclofenac sodium (VOLTAREN) 1 % GEL Apply 4 g topically 4 (four) times daily as needed. 400 g 11  . indomethacin (INDOCIN) 50 MG capsule Take 1 capsule (50 mg total) by mouth 2 (two) times daily with a meal. 10 capsule 0  . losartan (COZAAR) 50 MG tablet TAKE 1 TABLET BY MOUTH DAILY 90 tablet 1  . losartan-hydrochlorothiazide (HYZAAR) 100-12.5 MG tablet Take 1 tablet by mouth daily. Follow-up appt is due in Oct must see provider for future refills 30 tablet 0  . omeprazole (PRILOSEC) 20 MG capsule TAKE ONE CAPSULE BY MOUTH TWICE DAILY 60 capsule 11  . triamcinolone (NASACORT AQ) 55 MCG/ACT AERO nasal inhaler Place 2 sprays into the nose daily. 1 Inhaler 12  . WIXELA INHUB 500-50 MCG/DOSE AEPB INHALE 1 PUFF TWICE DAILY 60 each 5   No current facility-administered medications on file prior to visit.  Review of Systems  Constitutional: Negative for other unusual diaphoresis or sweats HENT: Negative for ear discharge or swelling Eyes: Negative for other worsening visual disturbances Respiratory: Negative for stridor or other swelling  Gastrointestinal: Negative for worsening distension or other blood Genitourinary: Negative for retention or other urinary change Musculoskeletal: Negative for other MSK pain or swelling Skin: Negative for color change or other new lesions Neurological: Negative for worsening tremors and other numbness  Psychiatric/Behavioral: Negative for worsening agitation or other fatigue All other system neg per pt    Objective:    Physical Exam BP 120/84   Pulse 96   Temp 98.8 F (37.1 C) (Oral)   Ht 5\' 6"  (1.676 m)   Wt 202 lb (91.6 kg)   SpO2 96%   BMI 32.60 kg/m  VS noted,  Constitutional: Pt appears in NAD HENT: Head: NCAT.  Right Ear: External ear normal.  Left Ear: External ear normal.  Eyes: . Pupils are equal, round, and reactive to light. Conjunctivae and EOM are normal Nose: without d/c or deformity Neck: Neck supple. Gross normal ROM Cardiovascular: Normal rate and regular rhythm.   Pulmonary/Chest: Effort normal and breath sounds without rales or wheezing.  Abd:  Soft, NT, ND, + BS, no organomegaly Neurological: Pt is alert. At baseline orientation, motor grossly intact Skin: Skin is warm. No rashes, other new lesions, no LE edema Psychiatric: Pt behavior is normal without agitation  No other exam findings Lab Results  Component Value Date   WBC 8.5 09/23/2018   HGB 10.5 (L) 09/23/2018   HCT 32.0 (L) 09/23/2018   PLT 246.0 09/23/2018   GLUCOSE 92 09/23/2018   CHOL 182 09/23/2018   TRIG 327.0 (H) 09/23/2018   HDL 37.40 (L) 09/23/2018   LDLDIRECT 95.0 09/23/2018   LDLCALC 81 09/25/2017   ALT 13 09/23/2018   AST 17 09/23/2018   NA 140 09/23/2018   K 4.4 09/23/2018   CL 104 09/23/2018   CREATININE 1.90 (H) 09/23/2018   BUN 17 09/23/2018   CO2 31 09/23/2018   TSH 1.51 09/23/2018   PSA 0.79 09/23/2018   INR 2.5 (H) 04/23/2009   HGBA1C 6.2 09/23/2018        Assessment & Plan:

## 2018-09-29 NOTE — Assessment & Plan Note (Signed)
With recent worsening, for increased po fluids, then f/u lab in 3 days

## 2018-09-29 NOTE — Patient Instructions (Addendum)
You had the flu shot today  Please increase your fluids over the next few days, and come to the LAB only on Friday to repeat the kidney testing  Please continue all other medications as before, and refills have been done if requested.  Please have the pharmacy call with any other refills you may need.  Please continue your efforts at being more active, low cholesterol diet, and weight control.  Please keep your appointments with your specialists as you may have planned  Please return in 6 months, or sooner if needed, with Lab testing done 3-5 days before

## 2018-09-29 NOTE — Assessment & Plan Note (Signed)
For b12 shot today 1000 mg

## 2018-09-29 NOTE — Assessment & Plan Note (Signed)
?   ckd vs aki with mild low volume - for increased fluids x 2-3 days and f/u BMP

## 2018-10-15 ENCOUNTER — Other Ambulatory Visit: Payer: Self-pay | Admitting: Internal Medicine

## 2018-11-15 ENCOUNTER — Other Ambulatory Visit: Payer: Self-pay | Admitting: Internal Medicine

## 2018-11-23 ENCOUNTER — Other Ambulatory Visit: Payer: Self-pay

## 2018-11-23 MED ORDER — LOSARTAN POTASSIUM-HCTZ 100-12.5 MG PO TABS: 1.0000 | ORAL_TABLET | Freq: Every day | ORAL | 1 refills | Status: DC

## 2018-12-02 ENCOUNTER — Other Ambulatory Visit: Payer: Self-pay | Admitting: Internal Medicine

## 2019-01-06 ENCOUNTER — Telehealth: Payer: Self-pay | Admitting: Internal Medicine

## 2019-01-06 MED ORDER — ALBUTEROL SULFATE HFA 108 (90 BASE) MCG/ACT IN AERS: 2.0000 | INHALATION_SPRAY | RESPIRATORY_TRACT | 0 refills | Status: DC | PRN

## 2019-01-06 NOTE — Telephone Encounter (Signed)
Copied from [place]. Topic: Quick Communication - Rx Refill/Question >> Jan 06, 2019  8:32 AM Judyann Munson wrote: Medication: albuterol (PROVENTIL HFA) 108 (90 Base) MCG/ACT inhaler   Has the patient contacted their pharmacy?yes  Preferred Pharmacy (with phone number or street name): [id number] - [place], [address] [phone] (Phone)   Agent: Please be advised that RX refills may take up to 3 business days. We ask that you follow-up with your pharmacy.

## 2019-01-19 ENCOUNTER — Ambulatory Visit: Payer: Federal, State, Local not specified - PPO | Admitting: Internal Medicine

## 2019-01-19 ENCOUNTER — Encounter: Payer: Self-pay | Admitting: Internal Medicine

## 2019-01-19 VITALS — BP 136/84 | HR 107 | Temp 98.2°F | Ht 66.0 in | Wt 196.0 lb

## 2019-01-19 DIAGNOSIS — M7552 Bursitis of left shoulder: Secondary | ICD-10-CM | POA: Insufficient documentation

## 2019-01-19 DIAGNOSIS — I10 Essential (primary) hypertension: Secondary | ICD-10-CM | POA: Diagnosis not present

## 2019-01-19 DIAGNOSIS — M7542 Impingement syndrome of left shoulder: Secondary | ICD-10-CM

## 2019-01-19 DIAGNOSIS — R739 Hyperglycemia, unspecified: Secondary | ICD-10-CM

## 2019-01-19 NOTE — Patient Instructions (Signed)
Please make an appt at the checkout desk with [facility] in this office as you would likely need a cortisone shot  Please continue all other medications as before, and refills have been done if requested.  Please have the pharmacy call with any other refills you may need.  Please continue your efforts at being more active, low cholesterol diet, and weight control.  Please keep your appointments with your specialists as you may have planned

## 2019-01-19 NOTE — Assessment & Plan Note (Signed)
Moderate, d/w pt this does not have to be a permanent problem, advised f/u with sports medicine in this office for probable cortisone injection

## 2019-01-19 NOTE — Assessment & Plan Note (Signed)
stable overall by history and exam, recent data reviewed with pt, and pt to continue medical treatment as before,  to f/u any worsening symptoms or concerns  

## 2019-01-19 NOTE — Progress Notes (Signed)
Subjective:    Patient ID: Darrell Hoffman, male    DOB: [birth year], 68 y.o.   MRN: [id number]  HPI  Here with acute onset 2 wks left shoulder pain, gradually worsening, associated with decreased ROM, moderate, constant, worse with any shoulder movement but more with abduction; no trauma or lifting heavy object.  Neck without incresaed pain or radiation or pain.  Has hx of right shoulder dislocation only.  Pt denies chest pain, increased sob or doe, wheezing, orthopnea, PND, increased LE swelling, palpitations, dizziness or syncope.  Pt denies new neurological symptoms such as new headache, or facial or extremity weakness or numbness   Pt denies polydipsia, polyuria Past Medical History:  Diagnosis Date  . ALCOHOL ABUSE, HX OF 10/03/2007  . ALLERGIC RHINITIS 10/03/2007  . ANEMIA-B12 DEFICIENCY 12/04/2009  . ANEMIA-IRON DEFICIENCY 11/27/2009  . ANEMIA-NOS 10/08/2007  . ASTHMA 10/03/2007  . B12 DEFICIENCY 10/06/2008  . CKD (chronic kidney disease) stage 3, GFR 30-59 ml/min (HCC) 03/31/2018  . COLONIC POLYPS, HX OF 10/03/2007  . ERECTILE DYSFUNCTION 10/03/2007  . GERD 10/03/2007  . HYPERLIPIDEMIA 10/03/2007  . HYPERTENSION 10/03/2007  . KNEE PAIN, RIGHT 10/08/2007  . Morbid obesity ([place]) 10/03/2007  . Substance abuse ([place])    marijuana daily   Past Surgical History:  Procedure Laterality Date  . bilateral hydrocele repair    . COLONOSCOPY    . POLYPECTOMY  01-03-2010  . s/p left knee arthroscopy  2009  . s/p TKR  03/2009  . VASECTOMY      reports that he has never smoked. He has never used smokeless tobacco. He reports current alcohol use. He reports current drug use. family history includes Alcohol abuse in his paternal uncle; Breast cancer in his mother; Coronary artery disease in an other family member; Diabetes in his cousin and sister; Hypertension in his sister; Lupus in his sister. No Known Allergies Current Outpatient Medications on File Prior to Visit  Medication Sig Dispense  Refill  . albuterol (PROVENTIL HFA) 108 (90 Base) MCG/ACT inhaler Inhale 2 puffs into the lungs every 4 (four) hours as needed. 3 Inhaler 0  . CIALIS 20 MG tablet TAKE 1 TABLET BY MOUTH DAILY AS NEEDED 5 tablet 11  . cyanocobalamin (,VITAMIN B-12,) 1000 MCG/ML injection Inject 1,000 mcg into the muscle. Inject 1 ML IM once per month     . diclofenac sodium (VOLTAREN) 1 % GEL Apply 4 g topically 4 (four) times daily as needed. 400 g 11  . GNP ASPIRIN LOW DOSE 81 MG EC tablet TAKE 1 TABLET BY MOUTH EVERY DAY 90 tablet 0  . indomethacin (INDOCIN) 50 MG capsule Take 1 capsule (50 mg total) by mouth 2 (two) times daily with a meal. 10 capsule 0  . losartan (COZAAR) 50 MG tablet TAKE 1 TABLET BY MOUTH DAILY 90 tablet 1  . losartan-hydrochlorothiazide (HYZAAR) 100-12.5 MG tablet Take 1 tablet by mouth daily. 90 tablet 1  . omeprazole (PRILOSEC) 20 MG capsule TAKE ONE CAPSULE BY MOUTH TWICE DAILY 60 capsule 11  . triamcinolone (NASACORT AQ) 55 MCG/ACT AERO nasal inhaler Place 2 sprays into the nose daily. 1 Inhaler 12  . WIXELA INHUB 500-50 MCG/DOSE AEPB INHALE 1 PUFF BY MOUTH TWICE DAILY 60 each 2   No current facility-administered medications on file prior to visit.    Review of Systems  Constitutional: Negative for other unusual diaphoresis or sweats HENT: Negative for ear discharge or swelling Eyes: Negative for other worsening visual disturbances Respiratory: Negative for  stridor or other swelling  Gastrointestinal: Negative for worsening distension or other blood Genitourinary: Negative for retention or other urinary change Musculoskeletal: Negative for other MSK pain or swelling Skin: Negative for color change or other new lesions Neurological: Negative for worsening tremors and other numbness  Psychiatric/Behavioral: Negative for worsening agitation or other fatigue All other system neg per pt    Objective:   Physical Exam BP 136/84   Pulse (!) 107   Temp 98.2 F (36.8 C) (Oral)    Ht 5\' 6"  (1.676 m)   Wt 196 lb (88.9 kg)   SpO2 97%   BMI 31.64 kg/m  VS noted,  Constitutional: Pt appears in NAD HENT: Head: NCAT.  Right Ear: External ear normal.  Left Ear: External ear normal.  Eyes: . Pupils are equal, round, and reactive to light. Conjunctivae and EOM are normal Nose: without d/c or deformity Neck: Neck supple. Gross normal ROM Cardiovascular: Normal rate and regular rhythm.   Pulmonary/Chest: Effort normal and breath sounds without rales or wheezing.  Left shoulder with tender left subacromial and reduced ROM with increased pain on abduction to 90 degrees Neurological: Pt is alert. At baseline orientation, motor grossly intact Skin: Skin is warm. No rashes, other new lesions, no LE edema Psychiatric: Pt behavior is normal without agitation  No other exam findings Lab Results  Component Value Date   WBC 8.5 09/23/2018   HGB 10.5 (L) 09/23/2018   HCT 32.0 (L) 09/23/2018   PLT 246.0 09/23/2018   GLUCOSE 92 09/23/2018   CHOL 182 09/23/2018   TRIG 327.0 (H) 09/23/2018   HDL 37.40 (L) 09/23/2018   LDLDIRECT 95.0 09/23/2018   LDLCALC 81 09/25/2017   ALT 13 09/23/2018   AST 17 09/23/2018   NA 140 09/23/2018   K 4.4 09/23/2018   CL 104 09/23/2018   CREATININE 1.90 (H) 09/23/2018   BUN 17 09/23/2018   CO2 31 09/23/2018   TSH 1.51 09/23/2018   PSA 0.79 09/23/2018   INR 2.5 (H) 04/23/2009   HGBA1C 6.2 09/23/2018          Assessment & Plan:

## 2019-01-29 ENCOUNTER — Encounter: Payer: Self-pay | Admitting: Family Medicine

## 2019-01-29 ENCOUNTER — Ambulatory Visit: Payer: Federal, State, Local not specified - PPO | Admitting: Family Medicine

## 2019-01-29 ENCOUNTER — Ambulatory Visit: Payer: Self-pay

## 2019-01-29 VITALS — BP 120/80 | HR 86 | Temp 98.5°F | Ht 66.0 in | Wt 198.8 lb

## 2019-01-29 DIAGNOSIS — M7542 Impingement syndrome of left shoulder: Secondary | ICD-10-CM

## 2019-01-29 DIAGNOSIS — M7552 Bursitis of left shoulder: Secondary | ICD-10-CM | POA: Diagnosis not present

## 2019-01-29 MED ORDER — METHYLPREDNISOLONE ACETATE 40 MG/ML IJ SUSP
40.0000 mg | Freq: Once | INTRAMUSCULAR | Status: AC
  Administered 2019-01-29: 40 mg via INTRAMUSCULAR

## 2019-01-29 NOTE — Patient Instructions (Signed)
Nice to meet you  Please try ice on the shoulder  Please try the exercises  Please see me back in 3-4 weeks if the pain is no better.

## 2019-01-29 NOTE — Assessment & Plan Note (Signed)
Findings on ultrasound suggest he has a mild bursitis.  Does have chronic changes supraspinatus but could be contributing. -Subacromial injection. -Counseled on home exercise therapy and supportive care. -If no improvement consider imaging or physical therapy.

## 2019-01-29 NOTE — Progress Notes (Signed)
Darrell Hoffman - 68 y.o. male MRN [id number]  Date of birth: [birth year]  SUBJECTIVE:  Including CC & ROS.  No chief complaint on file.   Darrell Hoffman is a 68 y.o. male that is presenting with left shoulder pain.  This is been ongoing for a few weeks.  It is localized to the shoulder.  Feels like the pain is staying the same.  Has not had any improvement with modalities to date.  No prior injury or history of surgery.  It is localized to the shoulder.  Reports pain is sharp and stabbing.  Feels like the pain is intermittent.  Does not report radicular symptoms.    Review of Systems  Constitutional: Negative for fever.  HENT: Negative for congestion.   Respiratory: Negative for cough.   Cardiovascular: Negative for chest pain.  Musculoskeletal: Negative for gait problem.  Skin: Negative for color change.  Neurological: Negative for weakness.  Hematological: Negative for adenopathy.  Psychiatric/Behavioral: Negative for agitation.    HISTORY: Past Medical, Surgical, Social, and Family History Reviewed & Updated per EMR.   Pertinent Historical Findings include:  Past Medical History:  Diagnosis Date  . ALCOHOL ABUSE, HX OF 10/03/2007  . ALLERGIC RHINITIS 10/03/2007  . ANEMIA-B12 DEFICIENCY 12/04/2009  . ANEMIA-IRON DEFICIENCY 11/27/2009  . ANEMIA-NOS 10/08/2007  . ASTHMA 10/03/2007  . B12 DEFICIENCY 10/06/2008  . CKD (chronic kidney disease) stage 3, GFR 30-59 ml/min (HCC) 03/31/2018  . COLONIC POLYPS, HX OF 10/03/2007  . ERECTILE DYSFUNCTION 10/03/2007  . GERD 10/03/2007  . HYPERLIPIDEMIA 10/03/2007  . HYPERTENSION 10/03/2007  . KNEE PAIN, RIGHT 10/08/2007  . Morbid obesity ([place]) 10/03/2007  . Substance abuse ([place])    marijuana daily    Past Surgical History:  Procedure Laterality Date  . bilateral hydrocele repair    . COLONOSCOPY    . POLYPECTOMY  01-03-2010  . s/p left knee arthroscopy  2009  . s/p TKR  03/2009  . VASECTOMY      No Known Allergies  Family History   Problem Relation Age of Onset  . Breast cancer Mother   . Lupus Sister   . Hypertension Sister   . Diabetes Sister   . Alcohol abuse Paternal Uncle   . Coronary artery disease Other        male 1st degree relative - MI at 67 yo  . Diabetes Cousin        2 cousins  . Colon cancer Neg Hx   . Esophageal cancer Neg Hx   . Rectal cancer Neg Hx   . Stomach cancer Neg Hx      Social History   Socioeconomic History  . Marital status: Divorced    Spouse name: Not on file  . Number of children: 2  . Years of education: Not on file  . Highest education level: Not on file  Occupational History  . Occupation: Research officer, political party: [employer]  Social Needs  . Financial resource strain: Not on file  . Food insecurity:    Worry: Not on file    Inability: Not on file  . Transportation needs:    Medical: Not on file    Non-medical: Not on file  Tobacco Use  . Smoking status: Never Smoker  . Smokeless tobacco: Never Used  Substance and Sexual Activity  . Alcohol use: Yes    Alcohol/week: 0.0 standard drinks    Comment: occasionally  . Drug use: Yes    Comment:  marijuana daily- last dose yesterday  . Sexual activity: Not on file  Lifestyle  . Physical activity:    Days per week: Not on file    Minutes per session: Not on file  . Stress: Not on file  Relationships  . Social connections:    Talks on phone: Not on file    Gets together: Not on file    Attends religious service: Not on file    Active member of club or organization: Not on file    Attends meetings of clubs or organizations: Not on file    Relationship status: Not on file  . Intimate partner violence:    Fear of current or ex partner: Not on file    Emotionally abused: Not on file    Physically abused: Not on file    Forced sexual activity: Not on file  Other Topics Concern  . Not on file  Social History Narrative  . Not on file     PHYSICAL EXAM:  VS: BP 120/80 (BP Location: Left  Arm, Patient Position: Sitting, Cuff Size: Large)   Pulse 86   Temp 98.5 F (36.9 C) (Oral)   Ht 5\' 6"  (1.676 m)   Wt 198 lb 12 oz (90.2 kg)   SpO2 96%   BMI 32.08 kg/m  Physical Exam Gen: NAD, alert, cooperative with exam, well-appearing ENT: normal lips, normal nasal mucosa,  Eye: normal EOM, normal conjunctiva and lids CV:  no edema, +2 pedal pulses   Resp: no accessory muscle use, non-labored,  Skin: no rashes, no areas of induration  Neuro: normal tone, normal sensation to touch Psych:  normal insight, alert and oriented MSK:  Left shoulder: Normal active flexion abduction. No specific area of tenderness. Normal external rotation. Normal strength resistance with internal and external rotation. Pain with empty can testing. Neurovascular intact  Limited ultrasound: Left knee:  Normal-appearing biceps tendon. Normal-appearing subscapularis and static and dynamic testing. Supraspinatus with chronic tendinopathy type changes.  There does appear to be hypoechoic change superficial to the supraspinatus to suggest a subacromial bursitis  Summary: Findings suggestive of subacromial bursitis.  Ultrasound and interpretation by Clearance Coots, MD    Aspiration/Injection Procedure Note Darrell Hoffman 29-May-1951  Procedure: Injection Indications: Left shoulder pain  Procedure Details Consent: Risks of procedure as well as the alternatives and risks of each were explained to the (patient/caregiver).  Consent for procedure obtained. Time Out: Verified patient identification, verified procedure, site/side was marked, verified correct patient position, special equipment/implants available, medications/allergies/relevent history reviewed, required imaging and test results available.  Performed.  The area was cleaned with iodine and alcohol swabs.    The left subacromial space was injected using 1 cc's of 40 mg Depo-Medrol and 4 cc's of 0.5% bupivacaine with a 25 1 1/2" needle.   Ultrasound was used. Images were obtained in long views showing the injection.     A sterile dressing was applied.  Patient did tolerate procedure well.     ASSESSMENT & PLAN:   Subacromial bursitis of left shoulder joint Findings on ultrasound suggest he has a mild bursitis.  Does have chronic changes supraspinatus but could be contributing. -Subacromial injection. -Counseled on home exercise therapy and supportive care. -If no improvement consider imaging or physical therapy.

## 2019-02-09 ENCOUNTER — Other Ambulatory Visit: Payer: Self-pay | Admitting: Internal Medicine

## 2019-02-10 ENCOUNTER — Ambulatory Visit: Payer: Federal, State, Local not specified - PPO | Admitting: Internal Medicine

## 2019-02-10 ENCOUNTER — Encounter: Payer: Self-pay | Admitting: Internal Medicine

## 2019-02-10 DIAGNOSIS — I10 Essential (primary) hypertension: Secondary | ICD-10-CM

## 2019-02-10 DIAGNOSIS — R05 Cough: Secondary | ICD-10-CM

## 2019-02-10 DIAGNOSIS — R739 Hyperglycemia, unspecified: Secondary | ICD-10-CM

## 2019-02-10 DIAGNOSIS — R059 Cough, unspecified: Secondary | ICD-10-CM | POA: Insufficient documentation

## 2019-02-10 MED ORDER — HYDROCODONE-HOMATROPINE 5-1.5 MG/5ML PO SYRP: 5.0000 mL | ORAL_SOLUTION | Freq: Four times a day (QID) | ORAL | 0 refills | Status: AC | PRN

## 2019-02-10 MED ORDER — AZITHROMYCIN 250 MG PO TABS: ORAL_TABLET | ORAL | 1 refills | Status: DC

## 2019-02-10 NOTE — Progress Notes (Signed)
Subjective:    Patient ID: Darrell Hoffman, male    DOB: 1951-01-02, 68 y.o.   MRN: [id number]  HPI  Here with acute onset mild to mod 2-3 days ST, HA, general weakness and malaise, with prod cough greenish sputum, but Pt denies chest pain, increased sob or doe, wheezing, orthopnea, PND, increased LE swelling, palpitations, dizziness or syncope.  Pt denies new neurological symptoms such as new headache, or facial or extremity weakness or numbness   Pt denies polydipsia, polyuria, Does c/o ongoing fatigue, but denies signficant daytime hypersomnolence. No other new complaints Past Medical History:  Diagnosis Date  . ALCOHOL ABUSE, HX OF 10/03/2007  . ALLERGIC RHINITIS 10/03/2007  . ANEMIA-B12 DEFICIENCY 12/04/2009  . ANEMIA-IRON DEFICIENCY 11/27/2009  . ANEMIA-NOS 10/08/2007  . ASTHMA 10/03/2007  . B12 DEFICIENCY 10/06/2008  . CKD (chronic kidney disease) stage 3, GFR 30-59 ml/min (HCC) 03/31/2018  . COLONIC POLYPS, HX OF 10/03/2007  . ERECTILE DYSFUNCTION 10/03/2007  . GERD 10/03/2007  . HYPERLIPIDEMIA 10/03/2007  . HYPERTENSION 10/03/2007  . KNEE PAIN, RIGHT 10/08/2007  . Morbid obesity ([place]) 10/03/2007  . Substance abuse ([place])    marijuana daily   Past Surgical History:  Procedure Laterality Date  . bilateral hydrocele repair    . COLONOSCOPY    . POLYPECTOMY  01-03-2010  . s/p left knee arthroscopy  2009  . s/p TKR  03/2009  . VASECTOMY      reports that he has never smoked. He has never used smokeless tobacco. He reports current alcohol use. He reports current drug use. family history includes Alcohol abuse in his paternal uncle; Breast cancer in his mother; Coronary artery disease in an other family member; Diabetes in his cousin and sister; Hypertension in his sister; Lupus in his sister. No Known Allergies Current Outpatient Medications on File Prior to Visit  Medication Sig Dispense Refill  . albuterol (PROVENTIL HFA) 108 (90 Base) MCG/ACT inhaler Inhale 2 puffs into the  lungs every 4 (four) hours as needed. 3 Inhaler 0  . CIALIS 20 MG tablet TAKE 1 TABLET BY MOUTH DAILY AS NEEDED 5 tablet 11  . cyanocobalamin (,VITAMIN B-12,) 1000 MCG/ML injection Inject 1,000 mcg into the muscle. Inject 1 ML IM once per month     . diclofenac sodium (VOLTAREN) 1 % GEL Apply 4 g topically 4 (four) times daily as needed. 400 g 11  . GNP ASPIRIN LOW DOSE 81 MG EC tablet TAKE 1 TABLET BY MOUTH EVERY DAY 90 tablet 0  . indomethacin (INDOCIN) 50 MG capsule Take 1 capsule (50 mg total) by mouth 2 (two) times daily with a meal. 10 capsule 0  . losartan (COZAAR) 50 MG tablet TAKE 1 TABLET BY MOUTH DAILY 90 tablet 1  . losartan-hydrochlorothiazide (HYZAAR) 100-12.5 MG tablet Take 1 tablet by mouth daily. 90 tablet 1  . omeprazole (PRILOSEC) 20 MG capsule TAKE ONE CAPSULE BY MOUTH TWICE DAILY 60 capsule 11  . triamcinolone (NASACORT AQ) 55 MCG/ACT AERO nasal inhaler Place 2 sprays into the nose daily. 1 Inhaler 12  . WIXELA INHUB 500-50 MCG/DOSE AEPB INHALE 1 PUFF BY MOUTH TWICE DAILY 60 each 2   No current facility-administered medications on file prior to visit.    Review of Systems  Constitutional: Negative for other unusual diaphoresis or sweats HENT: Negative for ear discharge or swelling Eyes: Negative for other worsening visual disturbances Respiratory: Negative for stridor or other swelling  Gastrointestinal: Negative for worsening distension or other blood Genitourinary: Negative for  retention or other urinary change Musculoskeletal: Negative for other MSK pain or swelling Skin: Negative for color change or other new lesions Neurological: Negative for worsening tremors and other numbness  Psychiatric/Behavioral: Negative for worsening agitation or other fatigue All other system neg    Objective:   Physical Exam BP 116/78   Pulse (!) 110   Temp 98.7 F (37.1 C) (Oral)   Ht 5\' 6"  (1.676 m)   Wt 188 lb (85.3 kg)   SpO2 98%   BMI 30.34 kg/m  VS noted, mild  ill Constitutional: Pt appears in NAD HENT: Head: NCAT.  Right Ear: External ear normal.  Left Ear: External ear normal.  Eyes: . Pupils are equal, round, and reactive to light. Conjunctivae and EOM are normal Bilat tm's with mild erythema.  Max sinus areas non tender.  Pharynx with mild erythema, no exudate Nose: without d/c or deformity Neck: Neck supple. Gross normal ROM Cardiovascular: Normal rate and regular rhythm.   Pulmonary/Chest: Effort normal and breath sounds decreased without rales or wheezing.  Neurological: Pt is alert. At baseline orientation, motor grossly intact Skin: Skin is warm. No rashes, other new lesions, no LE edema Psychiatric: Pt behavior is normal without agitation  No other exam findings Lab Results  Component Value Date   WBC 8.5 09/23/2018   HGB 10.5 (L) 09/23/2018   HCT 32.0 (L) 09/23/2018   PLT 246.0 09/23/2018   GLUCOSE 92 09/23/2018   CHOL 182 09/23/2018   TRIG 327.0 (H) 09/23/2018   HDL 37.40 (L) 09/23/2018   LDLDIRECT 95.0 09/23/2018   LDLCALC 81 09/25/2017   ALT 13 09/23/2018   AST 17 09/23/2018   NA 140 09/23/2018   K 4.4 09/23/2018   CL 104 09/23/2018   CREATININE 1.90 (H) 09/23/2018   BUN 17 09/23/2018   CO2 31 09/23/2018   TSH 1.51 09/23/2018   PSA 0.79 09/23/2018   INR 2.5 (H) 04/23/2009   HGBA1C 6.2 09/23/2018          Assessment & Plan:

## 2019-02-10 NOTE — Patient Instructions (Signed)
Please take all new medication as prescribed - the antibiotic, and cough medicine as needed  Please continue all other medications as before, and refills have been done if requested.  Please have the pharmacy call with any other refills you may need.  Please keep your appointments with your specialists as you may have planned   

## 2019-02-10 NOTE — Assessment & Plan Note (Signed)
stable overall by history and exam, recent data reviewed with pt, and pt to continue medical treatment as before,  to f/u any worsening symptoms or concerns  

## 2019-02-10 NOTE — Assessment & Plan Note (Signed)
Mild to mod, c/w bronchitis vs pan, for antibx course, declines cxr,  to f/u any worsening symptoms or concerns

## 2019-03-17 ENCOUNTER — Telehealth: Payer: Self-pay | Admitting: Internal Medicine

## 2019-03-17 ENCOUNTER — Other Ambulatory Visit: Payer: Self-pay | Admitting: Internal Medicine

## 2019-03-17 MED ORDER — FLUTICASONE PROPIONATE 50 MCG/ACT NA SUSP: 2.0000 | Freq: Every day | NASAL | 5 refills | Status: DC

## 2019-03-17 NOTE — Telephone Encounter (Signed)
Done erx 

## 2019-03-17 NOTE — Telephone Encounter (Signed)
Patient left voicemail message to request a refill of Flonase. Pt states he has not had the prescription in a while and medication not noted on current med list.

## 2019-03-17 NOTE — Addendum Note (Signed)
Addended by: Biagio Borg on: 03/17/2019 02:24 PM   Modules accepted: Orders

## 2019-03-29 ENCOUNTER — Encounter: Payer: Self-pay | Admitting: Internal Medicine

## 2019-04-01 ENCOUNTER — Encounter: Payer: Self-pay | Admitting: Internal Medicine

## 2019-04-01 ENCOUNTER — Ambulatory Visit (INDEPENDENT_AMBULATORY_CARE_PROVIDER_SITE_OTHER): Payer: Federal, State, Local not specified - PPO | Admitting: Internal Medicine

## 2019-04-01 DIAGNOSIS — I10 Essential (primary) hypertension: Secondary | ICD-10-CM

## 2019-04-01 DIAGNOSIS — Z0000 Encounter for general adult medical examination without abnormal findings: Secondary | ICD-10-CM

## 2019-04-01 DIAGNOSIS — R739 Hyperglycemia, unspecified: Secondary | ICD-10-CM

## 2019-04-01 NOTE — Assessment & Plan Note (Signed)
To check BP at home at least weekly, with goal < 130/80

## 2019-04-01 NOTE — Assessment & Plan Note (Signed)
stable overall by history and exam, recent data reviewed with pt, and pt to continue medical treatment as before,  to f/u any worsening symptoms or concerns  

## 2019-04-01 NOTE — Progress Notes (Signed)
Patient ID: Darrell Vu., male   DOB: [birth year], 68 y.o.   MRN: [id number]  Virtual Visit via Video Note  I connected with Darrell Hoffman. on 04/01/19 at  2:20 PM EDT by a video enabled telemedicine application and verified that I am speaking with the correct person using two identifiers. I am at office, pt is at home, and no others present   I discussed the limitations of evaluation and management by telemedicine and the availability of in person appointments. The patient expressed understanding and agreed to proceed.  History of Present Illness: Here for wellness and f/u;  Overall doing ok;  Pt denies Chest pain, worsening SOB, DOE, wheezing, orthopnea, PND, worsening LE edema, palpitations, dizziness or syncope.  Pt denies neurological change such as new headache, facial or extremity weakness.  Pt denies polydipsia, polyuria, or low sugar symptoms. Pt states overall good compliance with treatment and medications, good tolerability, and has been trying to follow appropriate diet.  Pt denies worsening depressive symptoms, suicidal ideation or panic. No fever, night sweats, wt loss, loss of appetite, or other constitutional symptoms.  Pt states good ability with ADL's, has low fall risk, home safety reviewed and adequate, no other significant changes in hearing or vision, and only occasionally active with exercise.  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 ALCOHOL ABUSE, HX OF 10/03/2007  . ALLERGIC RHINITIS 10/03/2007  . ANEMIA-B12 DEFICIENCY 12/04/2009  . ANEMIA-IRON DEFICIENCY 11/27/2009  . ANEMIA-NOS 10/08/2007  . ASTHMA 10/03/2007  . B12 DEFICIENCY 10/06/2008  . CKD (chronic kidney disease) stage 3, GFR 30-59 ml/min (HCC) 03/31/2018  . COLONIC POLYPS, HX OF 10/03/2007  . ERECTILE DYSFUNCTION 10/03/2007  . GERD 10/03/2007  . HYPERLIPIDEMIA 10/03/2007  . HYPERTENSION 10/03/2007  . KNEE  PAIN, RIGHT 10/08/2007  . Morbid obesity ([place]) 10/03/2007  . Substance abuse ([place])    marijuana daily   Past Surgical History:  Procedure Laterality Date  . bilateral hydrocele repair    . COLONOSCOPY    . POLYPECTOMY  01-03-2010  . s/p left knee arthroscopy  2009  . s/p TKR  03/2009  . VASECTOMY      reports that he has never smoked. He has never used smokeless tobacco. He reports current alcohol use. He reports current drug use. family history includes Alcohol abuse in his paternal uncle; Breast cancer in his mother; Coronary artery disease in an other family member; Diabetes in his cousin and sister; Hypertension in his sister; Lupus in his sister. No Known Allergies Current Outpatient Medications on File Prior to Visit  Medication Sig Dispense Refill  . albuterol (PROVENTIL HFA) 108 (90 Base) MCG/ACT inhaler Inhale 2 puffs into the lungs every 4 (four) hours as needed. 3 Inhaler 0  . aspirin 81 MG EC tablet TAKE 1 TABLET BY MOUTH EVERY DAY 90 tablet 0  . CIALIS 20 MG tablet TAKE 1 TABLET BY MOUTH DAILY AS NEEDED 5 tablet 11  . cyanocobalamin (,VITAMIN B-12,) 1000 MCG/ML injection Inject 1,000 mcg into the muscle. Inject 1 ML IM once per month     . diclofenac sodium (VOLTAREN) 1 % GEL Apply 4 g topically 4 (four) times daily as needed. 400 g 11  . fluticasone (FLONASE) 50 MCG/ACT nasal spray Place 2 sprays into both nostrils daily. 16 g 5  . indomethacin (INDOCIN) 50 MG capsule Take 1 capsule (50 mg  total) by mouth 2 (two) times daily with a meal. 10 capsule 0  . losartan (COZAAR) 50 MG tablet TAKE 1 TABLET BY MOUTH DAILY 90 tablet 1  . losartan-hydrochlorothiazide (HYZAAR) 100-12.5 MG tablet Take 1 tablet by mouth daily. 90 tablet 1  . omeprazole (PRILOSEC) 20 MG capsule TAKE ONE CAPSULE BY MOUTH TWICE DAILY 60 capsule 11  . WIXELA INHUB 500-50 MCG/DOSE AEPB INHALE 1 PUFF BY MOUTH TWICE DAILY 60 each 2   No current facility-administered medications on file prior to visit.      Observations/Objective: Alert, mentating well, normal mood and affect, obese, resps normal, cn 2-12 intact, moves all 4s, no visible swelling or rash Past Medical History:  Diagnosis Date  . ALCOHOL ABUSE, HX OF 10/03/2007  . ALLERGIC RHINITIS 10/03/2007  . ANEMIA-B12 DEFICIENCY 12/04/2009  . ANEMIA-IRON DEFICIENCY 11/27/2009  . ANEMIA-NOS 10/08/2007  . ASTHMA 10/03/2007  . B12 DEFICIENCY 10/06/2008  . CKD (chronic kidney disease) stage 3, GFR 30-59 ml/min (HCC) 03/31/2018  . COLONIC POLYPS, HX OF 10/03/2007  . ERECTILE DYSFUNCTION 10/03/2007  . GERD 10/03/2007  . HYPERLIPIDEMIA 10/03/2007  . HYPERTENSION 10/03/2007  . KNEE PAIN, RIGHT 10/08/2007  . Morbid obesity ([place]) 10/03/2007  . Substance abuse ([place])    marijuana daily   Past Surgical History:  Procedure Laterality Date  . bilateral hydrocele repair    . COLONOSCOPY    . POLYPECTOMY  01-03-2010  . s/p left knee arthroscopy  2009  . s/p TKR  03/2009  . VASECTOMY      reports that he has never smoked. He has never used smokeless tobacco. He reports current alcohol use. He reports current drug use. family history includes Alcohol abuse in his paternal uncle; Breast cancer in his mother; Coronary artery disease in an other family member; Diabetes in his cousin and sister; Hypertension in his sister; Lupus in his sister. No Known Allergies Current Outpatient Medications on File Prior to Visit  Medication Sig Dispense Refill  . albuterol (PROVENTIL HFA) 108 (90 Base) MCG/ACT inhaler Inhale 2 puffs into the lungs every 4 (four) hours as needed. 3 Inhaler 0  . aspirin 81 MG EC tablet TAKE 1 TABLET BY MOUTH EVERY DAY 90 tablet 0  . CIALIS 20 MG tablet TAKE 1 TABLET BY MOUTH DAILY AS NEEDED 5 tablet 11  . cyanocobalamin (,VITAMIN B-12,) 1000 MCG/ML injection Inject 1,000 mcg into the muscle. Inject 1 ML IM once per month     . diclofenac sodium (VOLTAREN) 1 % GEL Apply 4 g topically 4 (four) times daily as needed. 400 g 11  .  fluticasone (FLONASE) 50 MCG/ACT nasal spray Place 2 sprays into both nostrils daily. 16 g 5  . indomethacin (INDOCIN) 50 MG capsule Take 1 capsule (50 mg total) by mouth 2 (two) times daily with a meal. 10 capsule 0  . losartan (COZAAR) 50 MG tablet TAKE 1 TABLET BY MOUTH DAILY 90 tablet 1  . losartan-hydrochlorothiazide (HYZAAR) 100-12.5 MG tablet Take 1 tablet by mouth daily. 90 tablet 1  . omeprazole (PRILOSEC) 20 MG capsule TAKE ONE CAPSULE BY MOUTH TWICE DAILY 60 capsule 11  . WIXELA INHUB 500-50 MCG/DOSE AEPB INHALE 1 PUFF BY MOUTH TWICE DAILY 60 each 2   No current facility-administered medications on file prior to visit.    Assessment and Plan: See notes  Follow Up Instructions: See notes   I discussed the assessment and treatment plan with the patient. The patient was provided an opportunity to ask questions and  all were answered. The patient agreed with the plan and demonstrated an understanding of the instructions.   The patient was advised to call back or seek an in-person evaluation if the symptoms worsen or if the condition fails to improve as anticipated.   Cathlean Cower, MD

## 2019-04-01 NOTE — Patient Instructions (Signed)

## 2019-04-01 NOTE — Assessment & Plan Note (Signed)

## 2019-04-02 ENCOUNTER — Telehealth: Payer: Self-pay | Admitting: Internal Medicine

## 2019-04-02 NOTE — Telephone Encounter (Signed)
Left patient vm to call back to scheduled 6 month fu in October with labs prior.

## 2019-04-26 ENCOUNTER — Other Ambulatory Visit: Payer: Self-pay | Admitting: Internal Medicine

## 2019-05-25 ENCOUNTER — Other Ambulatory Visit: Payer: Self-pay

## 2019-05-27 ENCOUNTER — Other Ambulatory Visit: Payer: Self-pay | Admitting: Internal Medicine

## 2019-05-28 ENCOUNTER — Encounter: Payer: Self-pay | Admitting: Gastroenterology

## 2019-05-31 ENCOUNTER — Other Ambulatory Visit: Payer: Self-pay | Admitting: *Deleted

## 2019-05-31 MED ORDER — FLUTICASONE-SALMETEROL 500-50 MCG/DOSE IN AEPB: 1.0000 | INHALATION_SPRAY | Freq: Two times a day (BID) | RESPIRATORY_TRACT | 2 refills | Status: DC

## 2019-06-14 ENCOUNTER — Encounter: Payer: Self-pay | Admitting: Orthopedic Surgery

## 2019-06-14 ENCOUNTER — Ambulatory Visit (INDEPENDENT_AMBULATORY_CARE_PROVIDER_SITE_OTHER): Payer: Federal, State, Local not specified - PPO | Admitting: Orthopedic Surgery

## 2019-06-14 ENCOUNTER — Other Ambulatory Visit: Payer: Self-pay

## 2019-06-14 ENCOUNTER — Ambulatory Visit: Payer: Self-pay

## 2019-06-14 VITALS — Ht 66.0 in | Wt 200.0 lb

## 2019-06-14 DIAGNOSIS — M25561 Pain in right knee: Secondary | ICD-10-CM | POA: Diagnosis not present

## 2019-06-14 DIAGNOSIS — M25512 Pain in left shoulder: Secondary | ICD-10-CM | POA: Diagnosis not present

## 2019-06-16 ENCOUNTER — Encounter: Payer: Self-pay | Admitting: Orthopedic Surgery

## 2019-06-16 NOTE — Progress Notes (Signed)
Office Visit Note   Patient: Darrell Hoffman.           Date of Birth: [birth year]           MRN: [id number] Visit Date: 06/14/2019 Requested by: Biagio Borg, MD [address],  [place] [id number] PCP: Biagio Borg, MD  Subjective: Chief Complaint  Patient presents with  . Right Knee - Pain    HPI: Darrell Hoffman is a patient with right knee pain of 2 weeks duration.  Had total knee replacement done about 10 years ago.  Denies any history of injury.  Reports really peripatellar pain as well as pain in the back of the knee.  Denies any swelling fevers or chills.  Does feel some popping occasionally.  Does not take any medication for pain.  He works at the post [employer].  Denies any low back pain.  The pain does not wake him from sleep at night.  He does take antibiotics prophylactically before entering dental work.  Patient also describes a long history of left shoulder pain.  He has had plain radiographs which show no arthritis no fracture.  Also had an injection 2 months ago in the subacromial space done elsewhere which did not give him much relief.              ROS: All systems reviewed are negative as they relate to the chief complaint within the history of present illness.  Patient denies  fevers or chills.   Assessment & Plan: Visit Diagnoses:  1. Acute pain of right knee   2. Left shoulder pain, unspecified chronicity     Plan: Impression is right knee pain with a fullness in the posterior aspect of the knee.  Looked at this with ultrasound and it does not look like it is a Baker's cyst.  I think that something that we should reevaluate at his next return office visit and consider potential MRI scanning if needed to evaluate this somewhat atypical mass in the posterior aspect of his knee.  Could be a decompressed leaking Baker's cyst giving him symptoms.  There is no effusion in the knee itself and there is no evidence of loosening so I do not think it is necessarily total knee  related.  Regarding the left shoulder is had a long history of pain in that shoulder.  He is failed conservative management.  Had an injection 2 months ago which did not really help.  I think he may have small rotator cuff tear versus biceps tendon problem.  Does not really feel like arthritis and by report his radiographs did not show arthritis.  I will see him back after that left shoulder MRI scan.  We will decide then for or against further imaging of the knee as well.  Follow-Up Instructions: Return for after MRI.   Orders:  Orders Placed This Encounter  Procedures  . XR KNEE 3 VIEW RIGHT  . MR Shoulder Left w/ contrast  . Arthrogram   No orders of the defined types were placed in this encounter.     Procedures: No procedures performed   Clinical Data: No additional findings.  Objective: Vital Signs: Ht 5\' 6"  (1.676 m)   Wt 200 lb (90.7 kg)   BMI 32.28 kg/m   Physical Exam:   Constitutional: Patient appears well-developed HEENT:  Head: Normocephalic Eyes:EOM are normal Neck: Normal range of motion Cardiovascular: Normal rate Pulmonary/chest: Effort normal Neurologic: Patient is alert Skin: Skin  is warm Psychiatric: Patient has normal mood and affect    Ortho Exam: Ortho exam demonstrates normal gait alignment with excellent range of motion of that right knee.  He does have a fullness posteriorly which is present both palpably and visually right knee versus left.  Extensor mechanism is intact and nontender.  Collaterals are stable.  Range of motion full extension to about 115 of flexion.  No masses lymphadenopathy or skin changes noted in that right knee region.  He does have a fullness posteriorly which I do not really detect as a discrete mass.  Ultrasound examination demonstrates no discrete Baker's cyst.  Beyond that difficult to make an assessment as to what the soft tissue fullness is.  Examination of the left shoulder demonstrates full active and passive  range of motion with positive O'Brien's testing and positive impingement signs.  No discrete AC joint tenderness left versus right.  Neck range of motion is full.  Motor or sensory function to the arm is intact.  Specialty Comments:  No specialty comments available.  Imaging: No results found.   PMFS History: Patient Active Problem List   Diagnosis Date Noted  . Cough 02/10/2019  . Subacromial bursitis of left shoulder joint 01/19/2019  . CKD (chronic kidney disease) stage 3, GFR 30-59 ml/min (HCC) 03/31/2018  . Hyperglycemia 09/25/2017  . Daytime somnolence 09/25/2017  . Finger pain, right 03/27/2017  . History of colonic polyps 03/27/2016  . Pre-ulcerative corn or callous 03/27/2016  . Skin nodule 01/10/2016  . Venereal disease, unspecified 03/23/2012  . Renal insufficiency 04/02/2011  . Right elbow pain 04/02/2011  . Preventative health care 03/30/2011  . ANEMIA-B12 DEFICIENCY 12/04/2009  . ANEMIA-IRON DEFICIENCY 11/27/2009  . B12 deficiency 10/06/2008  . KNEE PAIN, RIGHT 10/08/2007  . Hyperlipidemia 10/03/2007  . Morbid obesity ([place]) 10/03/2007  . ERECTILE DYSFUNCTION 10/03/2007  . Essential hypertension 10/03/2007  . ALLERGIC RHINITIS 10/03/2007  . Asthma 10/03/2007  . GERD 10/03/2007  . ALCOHOL ABUSE, HX OF 10/03/2007  . COLONIC POLYPS, HX OF 10/03/2007   Past Medical History:  Diagnosis Date  . ALCOHOL ABUSE, HX OF 10/03/2007  . ALLERGIC RHINITIS 10/03/2007  . ANEMIA-B12 DEFICIENCY 12/04/2009  . ANEMIA-IRON DEFICIENCY 11/27/2009  . ANEMIA-NOS 10/08/2007  . ASTHMA 10/03/2007  . B12 DEFICIENCY 10/06/2008  . CKD (chronic kidney disease) stage 3, GFR 30-59 ml/min (HCC) 03/31/2018  . COLONIC POLYPS, HX OF 10/03/2007  . ERECTILE DYSFUNCTION 10/03/2007  . GERD 10/03/2007  . HYPERLIPIDEMIA 10/03/2007  . HYPERTENSION 10/03/2007  . KNEE PAIN, RIGHT 10/08/2007  . Morbid obesity ([place]) 10/03/2007  . Substance abuse ([place])    marijuana daily    Family History   Problem Relation Age of Onset  . Breast cancer Mother   . Lupus Sister   . Hypertension Sister   . Diabetes Sister   . Alcohol abuse Paternal Uncle   . Coronary artery disease Other        male 1st degree relative - MI at 67 yo  . Diabetes Cousin        2 cousins  . Colon cancer Neg Hx   . Esophageal cancer Neg Hx   . Rectal cancer Neg Hx   . Stomach cancer Neg Hx     Past Surgical History:  Procedure Laterality Date  . bilateral hydrocele repair    . COLONOSCOPY    . POLYPECTOMY  01-03-2010  . s/p left knee arthroscopy  2009  . s/p TKR  03/2009  . VASECTOMY  Social History   Occupational History  . Occupation: Research officer, political party: [employer]  Tobacco Use  . Smoking status: Never Smoker  . Smokeless tobacco: Never Used  Substance and Sexual Activity  . Alcohol use: Yes    Alcohol/week: 0.0 standard drinks    Comment: occasionally  . Drug use: Yes    Comment: marijuana daily- last dose yesterday  . Sexual activity: Not on file

## 2019-06-24 ENCOUNTER — Ambulatory Visit (AMBULATORY_SURGERY_CENTER): Payer: Self-pay | Admitting: *Deleted

## 2019-06-24 ENCOUNTER — Other Ambulatory Visit: Payer: Self-pay

## 2019-06-24 VITALS — Ht 66.5 in | Wt 220.0 lb

## 2019-06-24 DIAGNOSIS — Z86010 Personal history of colonic polyps: Secondary | ICD-10-CM

## 2019-06-24 MED ORDER — NA SULFATE-K SULFATE-MG SULF 17.5-3.13-1.6 GM/177ML PO SOLN: ORAL | 0 refills | Status: DC

## 2019-06-24 NOTE — Progress Notes (Signed)
Patient's pre-visit was done today over the phone with the patient due to COVID-19 pandemic. Name,DOB and address verified. Insurance verified. Packet of Prep instructions mailed to patient including copy of a consent form and pre-procedure patient acknowledgement form-pt is aware. Suprep $15 Coupon included. Patient understands to call us back with any questions or concerns. Patient denies any allergies to eggs or soy. Patient denies any problems with anesthesia/sedation. Patient denies any oxygen use at home. Patient denies taking any diet/weight loss medications or blood thinners. Patient was instructed not to smoke any drugs prep day or procedure day. Pt is aware that care partner will wait in the car during proceudre; if they feel like they will be too hot to wait in the car; they may wait in the lobby.  We want them to wear a mask (we do not have any that we can provide them), practice social distancing, and we will check their temperatures when they get here.  I did remind patient that their care partner needs to stay in the parking lot the entire time. Pt will wear mask into building.

## 2019-07-02 ENCOUNTER — Other Ambulatory Visit: Payer: Self-pay | Admitting: *Deleted

## 2019-07-02 MED ORDER — ASPIRIN 81 MG PO TBEC: 81.0000 mg | DELAYED_RELEASE_TABLET | Freq: Every day | ORAL | 1 refills | Status: DC

## 2019-07-07 ENCOUNTER — Telehealth: Payer: Self-pay | Admitting: Gastroenterology

## 2019-07-07 NOTE — Telephone Encounter (Signed)

## 2019-07-08 ENCOUNTER — Other Ambulatory Visit: Payer: Self-pay

## 2019-07-08 ENCOUNTER — Ambulatory Visit (AMBULATORY_SURGERY_CENTER): Payer: Federal, State, Local not specified - PPO | Admitting: Gastroenterology

## 2019-07-08 ENCOUNTER — Encounter: Payer: Self-pay | Admitting: Gastroenterology

## 2019-07-08 VITALS — BP 126/80 | HR 70 | Temp 98.9°F | Resp 16 | Ht 66.0 in | Wt 200.0 lb

## 2019-07-08 DIAGNOSIS — D122 Benign neoplasm of ascending colon: Secondary | ICD-10-CM

## 2019-07-08 DIAGNOSIS — D124 Benign neoplasm of descending colon: Secondary | ICD-10-CM

## 2019-07-08 DIAGNOSIS — Z86010 Personal history of colonic polyps: Secondary | ICD-10-CM

## 2019-07-08 DIAGNOSIS — Z1211 Encounter for screening for malignant neoplasm of colon: Secondary | ICD-10-CM | POA: Diagnosis not present

## 2019-07-08 MED ORDER — SODIUM CHLORIDE 0.9 % IV SOLN: 500.0000 mL | Freq: Once | INTRAVENOUS | Status: DC

## 2019-07-08 NOTE — Op Note (Addendum)
[facility] Patient Name: Darrell Hoffman Procedure Date: 07/08/2019 11:07 AM MRN: [id number] Endoscopist: Ladene Artist , MD Age: 68 Referring MD:  Date of Birth: Nov 08, 1951 Gender: Male Account #: [id number] Procedure:                Colonoscopy Indications:              Surveillance: Personal history of adenomatous                            polyps on last colonoscopy 3 years ago Medicines:                Monitored Anesthesia Care Procedure:                Pre-Anesthesia Assessment:                           - Prior to the procedure, a History and Physical                            was performed, and patient medications and                            allergies were reviewed. The patient's tolerance of                            previous anesthesia was also reviewed. The risks                            and benefits of the procedure and the sedation                            options and risks were discussed with the patient.                            All questions were answered, and informed consent                            was obtained. Prior Anticoagulants: The patient has                            taken no previous anticoagulant or antiplatelet                            agents. ASA Grade Assessment: III - A patient with                            severe systemic disease. After reviewing the risks                            and benefits, the patient was deemed in                            satisfactory condition to undergo the procedure.  After obtaining informed consent, the colonoscope                            was passed under direct vision. Throughout the                            procedure, the patient's blood pressure, pulse, and                            oxygen saturations were monitored continuously. The                            Colonoscope was introduced through the anus and                            advanced to the the cecum,  identified by                            appendiceal orifice and ileocecal valve. The                            ileocecal valve, appendiceal orifice, and rectum                            were photographed. The quality of the bowel                            preparation was excellent. The colonoscopy was                            performed without difficulty. The patient tolerated                            the procedure well. Scope In: 11:19:24 AM Scope Out: 11:34:31 AM Scope Withdrawal Time: 0 hours 10 minutes 51 seconds  Total Procedure Duration: 0 hours 15 minutes 7 seconds  Findings:                 The perianal and digital rectal examinations were                            normal.                           Two sessile polyps were found in the descending                            colon and ascending colon. The polyps were 6 to 7                            mm in size. These polyps were removed with a cold                            snare. Resection and retrieval were complete.  A few medium-mouthed diverticula were found in the                            transverse colon.                           Internal hemorrhoids were found during                            retroflexion. The hemorrhoids were small and Grade                            I (internal hemorrhoids that do not prolapse).                           The exam was otherwise without abnormality on                            direct and retroflexion views. Complications:            No immediate complications. Estimated blood loss:                            None. Estimated Blood Loss:     Estimated blood loss: none. Impression:               - Two 6 to 7 mm polyps in the descending colon and                            in the ascending colon, removed with a cold snare.                            Resected and retrieved.                           - Diverticulosis in the transverse colon.                            - Internal hemorrhoids.                           - The examination was otherwise normal on direct                            and retroflexion views. Recommendation:           - Repeat colonoscopy for surveillance, timing based                            on pathology results.                           - Patient has a contact number available for                            emergencies. The signs and symptoms of potential  delayed complications were discussed with the                            patient. Return to normal activities tomorrow.                            Written discharge instructions were provided to the                            patient.                           - Resume previous diet.                           - Continue present medications.                           - Await pathology results. Ladene Artist, MD 07/08/2019 11:45:34 AM This report has been signed electronically.

## 2019-07-08 NOTE — Progress Notes (Signed)
Pt's states no medical or surgical changes since previsit or office visit. 

## 2019-07-08 NOTE — Progress Notes (Signed)
Called to room to assist during endoscopic procedure.  Patient ID and intended procedure confirmed with present staff. Received instructions for my participation in the procedure from the performing physician.  

## 2019-07-08 NOTE — Progress Notes (Signed)
Riki Sheer took temp and [place] took vitals.

## 2019-07-08 NOTE — Patient Instructions (Signed)
Thank you for allowing [place] to care for you today!  Await pathology results by mail, approximately 2 weeks.  Will make recommendation at that time for next colonoscopy.  Resume previous diet and medications today.  Return to your normal activities tomorrow.    YOU HAD AN ENDOSCOPIC PROCEDURE TODAY AT [facility]:   Refer to the procedure report that was given to you for any specific questions about what was found during the examination.  If the procedure report does not answer your questions, please call your gastroenterologist to clarify.  If you requested that your care partner not be given the details of your procedure findings, then the procedure report has been included in a sealed envelope for you to review at your convenience later.  YOU SHOULD EXPECT: Some feelings of bloating in the abdomen. Passage of more gas than usual.  Walking can help get rid of the air that was put into your GI tract during the procedure and reduce the bloating. If you had a lower endoscopy (such as a colonoscopy or flexible sigmoidoscopy) you may notice spotting of blood in your stool or on the toilet paper. If you underwent a bowel prep for your procedure, you may not have a normal bowel movement for a few days.  Please Note:  You might notice some irritation and congestion in your nose or some drainage.  This is from the oxygen used during your procedure.  There is no need for concern and it should clear up in a day or so.  SYMPTOMS TO REPORT IMMEDIATELY:   Following lower endoscopy (colonoscopy or flexible sigmoidoscopy):  Excessive amounts of blood in the stool  Significant tenderness or worsening of abdominal pains  Swelling of the abdomen that is new, acute  Fever of 100F or higher   For urgent or emergent issues, a gastroenterologist can be reached at any hour by calling [phone].   DIET:  We do recommend a small meal at first, but then you may proceed to your regular diet.   Drink plenty of fluids but you should avoid alcoholic beverages for 24 hours.  ACTIVITY:  You should plan to take it easy for the rest of today and you should NOT DRIVE or use heavy machinery until tomorrow (because of the sedation medicines used during the test).    FOLLOW UP: Our staff will call the number listed on your records 48-72 hours following your procedure to check on you and address any questions or concerns that you may have regarding the information given to you following your procedure. If we do not reach you, we will leave a message.  We will attempt to reach you two times.  During this call, we will ask if you have developed any symptoms of COVID 19. If you develop any symptoms (ie: fever, flu-like symptoms, shortness of breath, cough etc.) before then, please call [phone].  If you test positive for Covid 19 in the 2 weeks post procedure, please call and report this information to [place].    If any biopsies were taken you will be contacted by phone or by letter within the next 1-3 weeks.  Please call us at [phone] if you have not heard about the biopsies in 3 weeks.    SIGNATURES/CONFIDENTIALITY: You and/or your care partner have signed paperwork which will be entered into your electronic medical record.  These signatures attest to the fact that that the information above on your After Visit Summary has been reviewed  and is understood.  Full responsibility of the confidentiality of this discharge information lies with you and/or your care-partner. 

## 2019-07-08 NOTE — Progress Notes (Signed)
PT taken to PACU. Monitors in place. VSS. Report given to RN. 

## 2019-07-12 ENCOUNTER — Telehealth: Payer: Self-pay

## 2019-07-12 NOTE — Telephone Encounter (Signed)
  Follow up Call-  Call back number 07/08/2019  Post procedure Call Back phone  # [phone]  Permission to leave phone message Yes  Some recent data might be hidden     Patient questions:  Do you have a fever, pain , or abdominal swelling? No. Pain Score  0 *  Have you tolerated food without any problems? Yes.    Have you been able to return to your normal activities? Yes.    Do you have any questions about your discharge instructions: Diet   No. Medications  No. Follow up visit  No.  Do you have questions or concerns about your Care? No.  Actions: * If pain score is 4 or above: No action needed, pain <4.  1. Have you developed a fever since your procedure? no  2.   Have you had an respiratory symptoms (SOB or cough) since your procedure? no  3.   Have you tested positive for COVID 19 since your procedure no  4.   Have you had any family members/close contacts diagnosed with the COVID 19 since your procedure?  no   If yes to any of these questions please route to Joylene John, RN and Alphonsa Gin, Therapist, sports.

## 2019-07-14 ENCOUNTER — Encounter: Payer: Self-pay | Admitting: Gastroenterology

## 2019-07-16 ENCOUNTER — Ambulatory Visit
Admission: RE | Admit: 2019-07-16 | Discharge: 2019-07-16 | Disposition: A | Discharge status: Home / Self Care | Payer: Federal, State, Local not specified - PPO | Source: Ambulatory Visit | Attending: Orthopedic Surgery | Admitting: Orthopedic Surgery

## 2019-07-16 ENCOUNTER — Other Ambulatory Visit: Payer: Self-pay

## 2019-07-16 DIAGNOSIS — M19012 Primary osteoarthritis, left shoulder: Secondary | ICD-10-CM | POA: Diagnosis not present

## 2019-07-16 DIAGNOSIS — M25512 Pain in left shoulder: Secondary | ICD-10-CM

## 2019-07-16 DIAGNOSIS — M75122 Complete rotator cuff tear or rupture of left shoulder, not specified as traumatic: Secondary | ICD-10-CM | POA: Diagnosis not present

## 2019-07-16 MED ORDER — IOPAMIDOL (ISOVUE-M 200) INJECTION 41%
13.0000 mL | Freq: Once | INTRAMUSCULAR | Status: AC
  Administered 2019-07-16: 13 mL via INTRA_ARTICULAR

## 2019-07-21 ENCOUNTER — Ambulatory Visit (INDEPENDENT_AMBULATORY_CARE_PROVIDER_SITE_OTHER): Payer: Federal, State, Local not specified - PPO | Admitting: Orthopedic Surgery

## 2019-07-21 ENCOUNTER — Encounter: Payer: Self-pay | Admitting: Orthopedic Surgery

## 2019-07-21 ENCOUNTER — Other Ambulatory Visit: Payer: Self-pay

## 2019-07-21 VITALS — Ht 66.0 in | Wt 225.0 lb

## 2019-07-21 DIAGNOSIS — M75121 Complete rotator cuff tear or rupture of right shoulder, not specified as traumatic: Secondary | ICD-10-CM

## 2019-07-21 NOTE — Progress Notes (Signed)
Office Visit Note   Patient: Darrell Hoffman.           Date of Birth: 1951/06/10           MRN: [id number] Visit Date: 07/21/2019 Requested by: Biagio Borg, MD [place],  [place] [address] PCP: Biagio Borg, MD  Subjective: Chief Complaint  Patient presents with  . Left Shoulder - Pain, Follow-up    MRI Review    HPI: Patient is a 68 year old male who presents to the clinic complaining of left shoulder pain.  Patient's main complaint is pain.  He denies weakness or significant stiffness.  Pain does not wake him up at night or cause him pain at rest.  The pain is worst when reaching out or away from his body.  He has tried taking Aleve and 5 weeks of a home exercise program with little relief.  He has had a left shoulder injection in April at another office but states he had no change in his pain.  Denies any history of left shoulder surgery.              ROS:  All systems reviewed are negative as they relate to the chief complaint within the history of present illness.  Patient denies fevers or chills.  Assessment & Plan: Visit Diagnoses: No diagnosis found.  Plan: Patient is a [age]-year-old male with history of left shoulder pain.  MRI revealed partial articular surface and interstitial tearing of the supraspinatus tendon with an articular surface tear of the infraspinatus.  It also revealed tendinosis of the subscapularis and long head of the biceps.  Patient has daily symptoms with his right shoulder.  He does have delaminated tear with a full-thickness component involving the supraspinatus tendon.  Has reasonable range of motion and strength but still has symptoms.  I think that the laminated portion of the tear is retracted to the top of the humeral head.  He may have several more months where we could potentially fix this but after that the tear will get increasingly more difficult to repair.  Discussed the rehab involved in rotator cuff repair.  He is going to  consider his options and call if he wants to schedule.  Follow-Up Instructions: Return if symptoms worsen or fail to improve.   Orders:  No orders of the defined types were placed in this encounter.  No orders of the defined types were placed in this encounter.     Procedures: No procedures performed   Clinical Data: No additional findings.  Objective: Vital Signs: Ht 5\' 6"  (1.676 m)   Wt 225 lb (102.1 kg)   BMI 36.32 kg/m   Physical Exam:  Constitutional: Patient appears well-developed HEENT:  Head: Normocephalic Eyes:EOM are normal Neck: Normal range of motion Cardiovascular: Normal rate Pulmonary/chest: Effort normal Neurologic: Patient is alert Skin: Skin is warm Psychiatric: Patient has normal mood and affect  Ortho Exam:  Left shoulder Exam Able to fully forward flex and abduct shoulder overhead No loss of ER relative to the other shoulder.  Good endpoint with ER TTP over the [facility] joint and bicipital groove Good subscapularis, supraspinatus, and infraspinatus strength.  Pain with rotator cuff resistance tests 5/5 grip strength, forearm pronation/supination, and bicep strength.  Pain with resisted bicep testing.  Specialty Comments:  No specialty comments available.  Imaging: No results found.   PMFS History: Patient Active Problem List   Diagnosis Date Noted  . Cough 02/10/2019  .  Subacromial bursitis of left shoulder joint 01/19/2019  . CKD (chronic kidney disease) stage 3, GFR 30-59 ml/min (HCC) 03/31/2018  . Hyperglycemia 09/25/2017  . Daytime somnolence 09/25/2017  . Finger pain, right 03/27/2017  . History of colonic polyps 03/27/2016  . Pre-ulcerative corn or callous 03/27/2016  . Skin nodule 01/10/2016  . Venereal disease, unspecified 03/23/2012  . Renal insufficiency 04/02/2011  . Right elbow pain 04/02/2011  . Preventative health care 03/30/2011  . ANEMIA-B12 DEFICIENCY 12/04/2009  . ANEMIA-IRON DEFICIENCY 11/27/2009  . B12 deficiency  10/06/2008  . KNEE PAIN, RIGHT 10/08/2007  . Hyperlipidemia 10/03/2007  . Morbid obesity ([place]) 10/03/2007  . ERECTILE DYSFUNCTION 10/03/2007  . Essential hypertension 10/03/2007  . ALLERGIC RHINITIS 10/03/2007  . Asthma 10/03/2007  . GERD 10/03/2007  . ALCOHOL ABUSE, HX OF 10/03/2007  . COLONIC POLYPS, HX OF 10/03/2007   Past Medical History:  Diagnosis Date  . ALCOHOL ABUSE, HX OF 10/03/2007  . ALLERGIC RHINITIS 10/03/2007  . ANEMIA-B12 DEFICIENCY 12/04/2009  . ANEMIA-IRON DEFICIENCY 11/27/2009  . ANEMIA-NOS 10/08/2007  . ASTHMA 10/03/2007  . B12 DEFICIENCY 10/06/2008  . CKD (chronic kidney disease) stage 3, GFR 30-59 ml/min (HCC) 03/31/2018  . COLONIC POLYPS, HX OF 10/03/2007  . ERECTILE DYSFUNCTION 10/03/2007  . GERD 10/03/2007  . HYPERLIPIDEMIA 10/03/2007  . HYPERTENSION 10/03/2007  . KNEE PAIN, RIGHT 10/08/2007  . Morbid obesity ([place]) 10/03/2007  . Substance abuse ([place])    marijuana daily    Family History  Problem Relation Age of Onset  . Breast cancer Mother   . Lupus Sister   . Hypertension Sister   . Diabetes Sister   . Alcohol abuse Paternal Uncle   . Coronary artery disease Other        male 1st degree relative - MI at 67 yo  . Diabetes Cousin        2 cousins  . Colon cancer Neg Hx   . Esophageal cancer Neg Hx   . Rectal cancer Neg Hx   . Stomach cancer Neg Hx   . Colon polyps Neg Hx     Past Surgical History:  Procedure Laterality Date  . bilateral hydrocele repair    . COLONOSCOPY  06/06/2016  . POLYPECTOMY  01-03-2010  . s/p left knee arthroscopy  2009  . s/p TKR  03/2009  . VASECTOMY     Social History   Occupational History  . Occupation: Research officer, political party: [employer]  Tobacco Use  . Smoking status: Never Smoker  . Smokeless tobacco: Never Used  Substance and Sexual Activity  . Alcohol use: Not Currently    Alcohol/week: 0.0 standard drinks    Comment: occasionally  . Drug use: Yes    Types: Marijuana     Comment: marijuana daily  . Sexual activity: Not on file

## 2019-07-22 ENCOUNTER — Encounter: Payer: Self-pay | Admitting: Orthopedic Surgery

## 2019-09-28 ENCOUNTER — Other Ambulatory Visit: Payer: Self-pay | Admitting: Internal Medicine

## 2019-09-28 MED ORDER — FLUTICASONE-SALMETEROL 500-50 MCG/DOSE IN AEPB: 1.0000 | INHALATION_SPRAY | Freq: Two times a day (BID) | RESPIRATORY_TRACT | 0 refills | Status: DC

## 2019-09-28 NOTE — Telephone Encounter (Signed)
Copied from [place] [id number]. Topic: Quick Communication - Rx Refill/Question >> Sep 28, 2019  3:47 PM Leward Quan A wrote: Medication: Fluticasone-Salmeterol (WIXELA INHUB) 500-50 MCG/DOSE AEPB   Has the patient contacted their pharmacy? Yes.   (Agent: If no, request that the patient contact the pharmacy for the refill.) (Agent: If yes, when and what did the pharmacy advise?)  Preferred Pharmacy (with phone number or street name): [facility] [place], [place] [phone] (Phone) [phone] (Fax    Agent: Please be advised that RX refills may take up to 3 business days. We ask that you follow-up with your pharmacy.

## 2019-09-28 NOTE — Telephone Encounter (Signed)
Requested Prescriptions  Pending Prescriptions Disp Refills  . Fluticasone-Salmeterol (WIXELA INHUB) 500-50 MCG/DOSE AEPB 60 each 0    Sig: Inhale 1 puff into the lungs 2 (two) times daily.     There is no refill protocol information for this order

## 2019-11-02 ENCOUNTER — Other Ambulatory Visit: Payer: Self-pay | Admitting: Internal Medicine

## 2019-11-02 NOTE — Telephone Encounter (Signed)
Medication Refill - Medication: Fluticasone-Salmeterol (WIXELA INHUB) 500-50 MCG/DOSE AEPB   Has the patient contacted their pharmacy? Yes.   (Agent: If no, request that the patient contact the pharmacy for the refill.) (Agent: If yes, when and what did the pharmacy advise?)  Preferred Pharmacy (with phone number or street name): [facility] [id number] - [place], [place]: Please be advised that RX refills may take up to 3 business days. We ask that you follow-up with your pharmacy.

## 2019-11-03 ENCOUNTER — Telehealth: Payer: Self-pay

## 2019-11-03 MED ORDER — ALBUTEROL SULFATE HFA 108 (90 BASE) MCG/ACT IN AERS: 2.0000 | INHALATION_SPRAY | RESPIRATORY_TRACT | 2 refills | Status: DC | PRN

## 2019-11-03 NOTE — Telephone Encounter (Signed)
Copied from [place] [id number]. Topic: General - Inquiry >> Nov 02, 2019  5:11 PM Percell Belt A wrote: Reason for CRM: pt called in and stated he takes Advair, he would like like a new script called in for this med.  He stated he is down to his last 4 days  [place]  on [address]

## 2019-11-04 ENCOUNTER — Other Ambulatory Visit (INDEPENDENT_AMBULATORY_CARE_PROVIDER_SITE_OTHER): Payer: Federal, State, Local not specified - PPO

## 2019-11-04 ENCOUNTER — Ambulatory Visit (INDEPENDENT_AMBULATORY_CARE_PROVIDER_SITE_OTHER): Payer: Federal, State, Local not specified - PPO | Admitting: Internal Medicine

## 2019-11-04 ENCOUNTER — Other Ambulatory Visit: Payer: Self-pay

## 2019-11-04 ENCOUNTER — Encounter: Payer: Self-pay | Admitting: Internal Medicine

## 2019-11-04 VITALS — BP 126/80 | HR 88 | Temp 98.8°F | Ht 66.0 in | Wt 190.0 lb

## 2019-11-04 DIAGNOSIS — Z23 Encounter for immunization: Secondary | ICD-10-CM | POA: Diagnosis not present

## 2019-11-04 DIAGNOSIS — E538 Deficiency of other specified B group vitamins: Secondary | ICD-10-CM

## 2019-11-04 DIAGNOSIS — Z0000 Encounter for general adult medical examination without abnormal findings: Secondary | ICD-10-CM

## 2019-11-04 DIAGNOSIS — E559 Vitamin D deficiency, unspecified: Secondary | ICD-10-CM

## 2019-11-04 DIAGNOSIS — R739 Hyperglycemia, unspecified: Secondary | ICD-10-CM | POA: Diagnosis not present

## 2019-11-04 DIAGNOSIS — E611 Iron deficiency: Secondary | ICD-10-CM

## 2019-11-04 DIAGNOSIS — N183 Chronic kidney disease, stage 3 unspecified: Secondary | ICD-10-CM

## 2019-11-04 DIAGNOSIS — I10 Essential (primary) hypertension: Secondary | ICD-10-CM

## 2019-11-04 DIAGNOSIS — Z125 Encounter for screening for malignant neoplasm of prostate: Secondary | ICD-10-CM | POA: Diagnosis not present

## 2019-11-04 LAB — BASIC METABOLIC PANEL
BUN: 13 mg/dL (ref 6–23)
CO2: 28 mEq/L (ref 19–32)
Calcium: 9.2 mg/dL (ref 8.4–10.5)
Chloride: 105 mEq/L (ref 96–112)
Creatinine, Ser: 1.57 mg/dL — ABNORMAL HIGH (ref 0.40–1.50)
GFR: 53.54 mL/min — ABNORMAL LOW (ref >60.00)
Glucose, Bld: 83 mg/dL (ref 70–99)
Potassium: 4 mEq/L (ref 3.5–5.1)
Sodium: 141 mEq/L (ref 135–145)

## 2019-11-04 LAB — URINALYSIS, ROUTINE W REFLEX MICROSCOPIC
Bilirubin Urine: NEGATIVE
Hgb urine dipstick: NEGATIVE
Ketones, ur: NEGATIVE
Leukocytes,Ua: NEGATIVE
Nitrite: NEGATIVE
Specific Gravity, Urine: 1.02 (ref 1.000–1.030)
Total Protein, Urine: NEGATIVE
Urine Glucose: NEGATIVE
Urobilinogen, UA: 0.2 (ref 0.0–1.0)
WBC, UA: NONE SEEN — AB (ref 0–?)
pH: 6 (ref 5.0–8.0)

## 2019-11-04 LAB — CBC WITH DIFFERENTIAL/PLATELET
Basophils Absolute: 0 10*3/uL (ref 0.0–0.1)
Basophils Relative: 0.5 % (ref 0.0–3.0)
Eosinophils Absolute: 0.1 10*3/uL (ref 0.0–0.7)
Eosinophils Relative: 1.2 % (ref 0.0–5.0)
HCT: 32.5 % — ABNORMAL LOW (ref 39.0–52.0)
Hemoglobin: 10.9 g/dL — ABNORMAL LOW (ref 13.0–17.0)
Lymphocytes Relative: 39.2 % (ref 12.0–46.0)
Lymphs Abs: 2.9 10*3/uL (ref 0.7–4.0)
MCHC: 33.4 g/dL (ref 30.0–36.0)
MCV: 84.9 fl (ref 78.0–100.0)
Monocytes Absolute: 0.4 10*3/uL (ref 0.1–1.0)
Monocytes Relative: 5 % (ref 3.0–12.0)
Neutro Abs: 3.9 10*3/uL (ref 1.4–7.7)
Neutrophils Relative %: 54.1 % (ref 43.0–77.0)
Platelets: 224 10*3/uL (ref 150.0–400.0)
RBC: 3.83 Mil/uL — ABNORMAL LOW (ref 4.22–5.81)
RDW: 13.6 % (ref 11.5–15.5)
WBC: 7.3 10*3/uL (ref 4.0–10.5)

## 2019-11-04 LAB — IBC PANEL
Iron: 51 ug/dL (ref 42–165)
Saturation Ratios: 15.8 % — ABNORMAL LOW (ref 20.0–50.0)
Transferrin: 231 mg/dL (ref 212.0–360.0)

## 2019-11-04 LAB — HEPATIC FUNCTION PANEL
ALT: 12 U/L (ref 0–53)
AST: 16 U/L (ref 0–37)
Albumin: 4.2 g/dL (ref 3.5–5.2)
Alkaline Phosphatase: 49 U/L (ref 39–117)
Bilirubin, Direct: 0.1 mg/dL (ref 0.0–0.3)
Total Bilirubin: 0.3 mg/dL (ref 0.2–1.2)
Total Protein: 6.5 g/dL (ref 6.0–8.3)

## 2019-11-04 LAB — LIPID PANEL
Cholesterol: 174 mg/dL (ref 0–200)
HDL: 36 mg/dL — ABNORMAL LOW (ref >39.00)
NonHDL: 137.97
Total CHOL/HDL Ratio: 5
Triglycerides: 323 mg/dL — ABNORMAL HIGH (ref 0.0–149.0)
VLDL: 64.6 mg/dL — ABNORMAL HIGH (ref 0.0–40.0)

## 2019-11-04 LAB — LDL CHOLESTEROL, DIRECT: Direct LDL: 89 mg/dL

## 2019-11-04 LAB — HEMOGLOBIN A1C: Hgb A1c MFr Bld: 6.4 % (ref 4.6–6.5)

## 2019-11-04 MED ORDER — FLUTICASONE-SALMETEROL 500-50 MCG/DOSE IN AEPB: 1.0000 | INHALATION_SPRAY | Freq: Two times a day (BID) | RESPIRATORY_TRACT | 11 refills | Status: DC

## 2019-11-04 NOTE — Progress Notes (Signed)
Subjective:    Patient ID: Darrell Phoenix., male    DOB: 03-06-1951, 68 y.o.   MRN: [id number]  HPI  Here for wellness and f/u;  Overall doing ok;  Pt denies Chest pain, worsening SOB, DOE, wheezing, orthopnea, PND, worsening LE edema, palpitations, dizziness or syncope.  Pt denies neurological change such as new headache, facial or extremity weakness.  Pt denies polydipsia, polyuria, or low sugar symptoms. Pt states overall good compliance with treatment and medications, good tolerability, and has been trying to follow appropriate diet.  Pt denies worsening depressive symptoms, suicidal ideation or panic. No fever, night sweats, wt loss, loss of appetite, or other constitutional symptoms.  Pt states good ability with ADL's, has low fall risk, home safety reviewed and adequate, no other significant changes in hearing or vision, and only occasionally active with exercise. Still working for now.  No new complaints Past Medical History:  Diagnosis Date  . ALCOHOL ABUSE, HX OF 10/03/2007  . ALLERGIC RHINITIS 10/03/2007  . ANEMIA-B12 DEFICIENCY 12/04/2009  . ANEMIA-IRON DEFICIENCY 11/27/2009  . ANEMIA-NOS 10/08/2007  . ASTHMA 10/03/2007  . B12 DEFICIENCY 10/06/2008  . CKD (chronic kidney disease) stage 3, GFR 30-59 ml/min 03/31/2018  . COLONIC POLYPS, HX OF 10/03/2007  . ERECTILE DYSFUNCTION 10/03/2007  . GERD 10/03/2007  . HYPERLIPIDEMIA 10/03/2007  . HYPERTENSION 10/03/2007  . KNEE PAIN, RIGHT 10/08/2007  . Morbid obesity ([place]) 10/03/2007  . Substance abuse ([place])    marijuana daily   Past Surgical History:  Procedure Laterality Date  . bilateral hydrocele repair    . COLONOSCOPY  06/06/2016  . POLYPECTOMY  01-03-2010  . s/p left knee arthroscopy  2009  . s/p TKR  03/2009  . VASECTOMY      reports that he has never smoked. He has never used smokeless tobacco. He reports previous alcohol use. He reports current drug use. Drug: Marijuana. family history includes Alcohol abuse in his  paternal uncle; Breast cancer in his mother; Coronary artery disease in an other family member; Diabetes in his cousin and sister; Hypertension in his sister; Lupus in his sister. No Known Allergies Current Outpatient Medications on File Prior to Visit  Medication Sig Dispense Refill  . albuterol (PROVENTIL HFA) 108 (90 Base) MCG/ACT inhaler Inhale 2 puffs into the lungs every 4 (four) hours as needed. 18 g 2  . aspirin 81 MG EC tablet Take 1 tablet (81 mg total) by mouth daily. Swallow whole. 90 tablet 1  . CIALIS 20 MG tablet TAKE 1 TABLET BY MOUTH DAILY AS NEEDED 5 tablet 11  . cyanocobalamin (,VITAMIN B-12,) 1000 MCG/ML injection Inject 1,000 mcg into the muscle. Inject 1 ML IM once per month     . fluticasone (FLONASE) 50 MCG/ACT nasal spray Place 2 sprays into both nostrils daily. 16 g 5  . losartan-hydrochlorothiazide (HYZAAR) 100-12.5 MG tablet TAKE 1 TABLET BY MOUTH DAILY 90 tablet 1  . omeprazole (PRILOSEC) 20 MG capsule TAKE ONE CAPSULE BY MOUTH TWICE DAILY 60 capsule 11   No current facility-administered medications on file prior to visit.    Review of Systems Constitutional: Negative for other unusual diaphoresis, sweats, appetite or weight changes HENT: Negative for other worsening hearing loss, ear pain, facial swelling, mouth sores or neck stiffness.   Eyes: Negative for other worsening pain, redness or other visual disturbance.  Respiratory: Negative for other stridor or swelling Cardiovascular: Negative for other palpitations or other chest pain  Gastrointestinal: Negative for worsening diarrhea or loose stools,  blood in stool, distention or other pain Genitourinary: Negative for hematuria, flank pain or other change in urine volume.  Musculoskeletal: Negative for myalgias or other joint swelling.  Skin: Negative for other color change, or other wound or worsening drainage.  Neurological: Negative for other syncope or numbness. Hematological: Negative for other adenopathy  or swelling Psychiatric/Behavioral: Negative for hallucinations, other worsening agitation, SI, self-injury, or new decreased concentration All otherwise neg per pt     Objective:   Physical Exam BP 126/80 (BP Location: Left Arm, Patient Position: Sitting, Cuff Size: Normal)   Pulse 88   Temp 98.8 F (37.1 C) (Oral)   Ht 5\' 6"  (1.676 m)   Wt 190 lb (86.2 kg)   SpO2 98%   BMI 30.67 kg/m  VS noted,  Constitutional: Pt is oriented to person, place, and time. Appears well-developed and well-nourished, in no significant distress and comfortable Head: Normocephalic and atraumatic  Eyes: Conjunctivae and EOM are normal. Pupils are equal, round, and reactive to light Right Ear: External ear normal without discharge Left Ear: External ear normal without discharge Nose: Nose without discharge or deformity Mouth/Throat: Oropharynx is without other ulcerations and moist  Neck: Normal range of motion. Neck supple. No JVD present. No tracheal deviation present or significant neck LA or mass Cardiovascular: Normal rate, regular rhythm, normal heart sounds and intact distal pulses.   Pulmonary/Chest: WOB normal and breath sounds without rales or wheezing  Abdominal: Soft. Bowel sounds are normal. NT. No HSM  Musculoskeletal: Normal range of motion. Exhibits no edema Lymphadenopathy: Has no other cervical adenopathy.  Neurological: Pt is alert and oriented to person, place, and time. Pt has normal reflexes. No cranial nerve deficit. Motor grossly intact, Gait intact Skin: Skin is warm and dry. No rash noted or new ulcerations Psychiatric:  Has normal mood and affect. Behavior is normal without agitation All otherwise neg per pt Lab Results  Component Value Date   WBC 8.5 09/23/2018   HGB 10.5 (L) 09/23/2018   HCT 32.0 (L) 09/23/2018   PLT 246.0 09/23/2018   GLUCOSE 92 09/23/2018   CHOL 182 09/23/2018   TRIG 327.0 (H) 09/23/2018   HDL 37.40 (L) 09/23/2018   LDLDIRECT 95.0 09/23/2018    LDLCALC 81 09/25/2017   ALT 13 09/23/2018   AST 17 09/23/2018   NA 140 09/23/2018   K 4.4 09/23/2018   CL 104 09/23/2018   CREATININE 1.90 (H) 09/23/2018   BUN 17 09/23/2018   CO2 31 09/23/2018   TSH 1.51 09/23/2018   PSA 0.79 09/23/2018   INR 2.5 (H) 04/23/2009   HGBA1C 6.2 09/23/2018         Assessment & Plan:

## 2019-11-04 NOTE — Assessment & Plan Note (Signed)
stable overall by history and exam, recent data reviewed with pt, and pt to continue medical treatment as before,  to f/u any worsening symptoms or concerns  

## 2019-11-04 NOTE — Assessment & Plan Note (Signed)

## 2019-11-04 NOTE — Assessment & Plan Note (Signed)
For f/u lab 

## 2019-11-04 NOTE — Patient Instructions (Addendum)

## 2019-11-05 ENCOUNTER — Other Ambulatory Visit: Payer: Self-pay | Admitting: Internal Medicine

## 2019-11-05 LAB — PSA: PSA: 1 ng/mL (ref 0.10–4.00)

## 2019-11-05 LAB — VITAMIN D 25 HYDROXY (VIT D DEFICIENCY, FRACTURES): VITD: <7 ng/mL — ABNORMAL LOW (ref 30.00–100.00)

## 2019-11-05 LAB — TSH: TSH: 1.76 u[IU]/mL (ref 0.35–4.50)

## 2019-11-05 LAB — VITAMIN B12: Vitamin B-12: 136 pg/mL — ABNORMAL LOW (ref 211–911)

## 2019-11-05 MED ORDER — VITAMIN D (ERGOCALCIFEROL) 1.25 MG (50000 UNIT) PO CAPS: 50000.0000 [IU] | ORAL_CAPSULE | ORAL | 0 refills | Status: DC

## 2019-11-19 ENCOUNTER — Telehealth: Payer: Self-pay | Admitting: Orthopedic Surgery

## 2019-11-19 NOTE — Telephone Encounter (Signed)
Patient stopped by the office today and stated that he received a bill for $1300.  He called his insurance company and they said that the bill did not have the doctor's name or what services were rendered.  [id number].  Thank you.

## 2019-11-22 NOTE — Telephone Encounter (Signed)
IC s/w patient. He was actually referring to the MRI scan that he had done at [facility]. I gave him phone number for [facility] to inquire about his billing questions regarding this matter.

## 2019-12-29 ENCOUNTER — Other Ambulatory Visit: Payer: Self-pay | Admitting: Internal Medicine

## 2019-12-29 MED ORDER — LOSARTAN POTASSIUM-HCTZ 100-12.5 MG PO TABS: 1.0000 | ORAL_TABLET | Freq: Every day | ORAL | 1 refills | Status: DC

## 2019-12-29 NOTE — Telephone Encounter (Signed)
Medication Refill - Medication: losartan-hydrochlorothiazide (HYZAAR) 100-12.5 MG tablet  Has the patient contacted their pharmacy? Yes.   (Agent: If no, request that the patient contact the pharmacy for the refill.) (Agent: If yes, when and what did the pharmacy advise?)  Preferred Pharmacy (with phone number or street name): [facility] [id number] - [place], [place]: Please be advised that RX refills may take up to 3 business days. We ask that you follow-up with your pharmacy.

## 2020-01-16 ENCOUNTER — Other Ambulatory Visit: Payer: Self-pay | Admitting: Internal Medicine

## 2020-06-06 ENCOUNTER — Ambulatory Visit: Payer: Federal, State, Local not specified - PPO | Admitting: Internal Medicine

## 2020-06-07 ENCOUNTER — Other Ambulatory Visit (INDEPENDENT_AMBULATORY_CARE_PROVIDER_SITE_OTHER): Payer: Federal, State, Local not specified - PPO

## 2020-06-07 ENCOUNTER — Other Ambulatory Visit: Payer: Self-pay

## 2020-06-07 ENCOUNTER — Ambulatory Visit: Payer: Federal, State, Local not specified - PPO | Admitting: Internal Medicine

## 2020-06-07 ENCOUNTER — Ambulatory Visit (INDEPENDENT_AMBULATORY_CARE_PROVIDER_SITE_OTHER)
Admission: RE | Admit: 2020-06-07 | Discharge: 2020-06-07 | Disposition: A | Discharge status: Home / Self Care | Payer: Federal, State, Local not specified - PPO | Source: Ambulatory Visit | Attending: Internal Medicine | Admitting: Internal Medicine

## 2020-06-07 ENCOUNTER — Encounter: Payer: Self-pay | Admitting: Internal Medicine

## 2020-06-07 VITALS — BP 102/78 | HR 74 | Temp 98.3°F | Ht 66.0 in | Wt 180.0 lb

## 2020-06-07 DIAGNOSIS — I10 Essential (primary) hypertension: Secondary | ICD-10-CM

## 2020-06-07 DIAGNOSIS — E538 Deficiency of other specified B group vitamins: Secondary | ICD-10-CM

## 2020-06-07 DIAGNOSIS — N183 Chronic kidney disease, stage 3 unspecified: Secondary | ICD-10-CM

## 2020-06-07 DIAGNOSIS — R739 Hyperglycemia, unspecified: Secondary | ICD-10-CM | POA: Diagnosis not present

## 2020-06-07 DIAGNOSIS — E785 Hyperlipidemia, unspecified: Secondary | ICD-10-CM

## 2020-06-07 DIAGNOSIS — R634 Abnormal weight loss: Secondary | ICD-10-CM

## 2020-06-07 DIAGNOSIS — J9811 Atelectasis: Secondary | ICD-10-CM | POA: Diagnosis not present

## 2020-06-07 DIAGNOSIS — R140 Abdominal distension (gaseous): Secondary | ICD-10-CM

## 2020-06-07 DIAGNOSIS — E559 Vitamin D deficiency, unspecified: Secondary | ICD-10-CM | POA: Diagnosis not present

## 2020-06-07 DIAGNOSIS — K219 Gastro-esophageal reflux disease without esophagitis: Secondary | ICD-10-CM

## 2020-06-07 LAB — LIPID PANEL
Cholesterol: 153 mg/dL (ref 0–200)
HDL: 37.6 mg/dL — ABNORMAL LOW (ref >39.00)
NonHDL: 115.03
Total CHOL/HDL Ratio: 4
Triglycerides: 284 mg/dL — ABNORMAL HIGH (ref 0.0–149.0)
VLDL: 56.8 mg/dL — ABNORMAL HIGH (ref 0.0–40.0)

## 2020-06-07 LAB — URINALYSIS, ROUTINE W REFLEX MICROSCOPIC
Bilirubin Urine: NEGATIVE
Hgb urine dipstick: NEGATIVE
Ketones, ur: NEGATIVE
Leukocytes,Ua: NEGATIVE
Nitrite: NEGATIVE
RBC / HPF: NONE SEEN (ref 0–?)
Specific Gravity, Urine: 1.025 (ref 1.000–1.030)
Total Protein, Urine: NEGATIVE
Urine Glucose: NEGATIVE
Urobilinogen, UA: 0.2 (ref 0.0–1.0)
pH: 5.5 (ref 5.0–8.0)

## 2020-06-07 LAB — BASIC METABOLIC PANEL
BUN: 20 mg/dL (ref 6–23)
CO2: 28 mEq/L (ref 19–32)
Calcium: 9.6 mg/dL (ref 8.4–10.5)
Chloride: 100 mEq/L (ref 96–112)
Creatinine, Ser: 1.42 mg/dL (ref 0.40–1.50)
GFR: 60.01 mL/min (ref >60.00)
Glucose, Bld: 88 mg/dL (ref 70–99)
Potassium: 3.9 mEq/L (ref 3.5–5.1)
Sodium: 135 mEq/L (ref 135–145)

## 2020-06-07 LAB — CBC WITH DIFFERENTIAL/PLATELET
Basophils Absolute: 0 10*3/uL (ref 0.0–0.1)
Basophils Relative: 0.3 % (ref 0.0–3.0)
Eosinophils Absolute: 0.1 10*3/uL (ref 0.0–0.7)
Eosinophils Relative: 1.1 % (ref 0.0–5.0)
HCT: 34.2 % — ABNORMAL LOW (ref 39.0–52.0)
Hemoglobin: 11.5 g/dL — ABNORMAL LOW (ref 13.0–17.0)
Lymphocytes Relative: 30.1 % (ref 12.0–46.0)
Lymphs Abs: 2.7 10*3/uL (ref 0.7–4.0)
MCHC: 33.7 g/dL (ref 30.0–36.0)
MCV: 82.7 fl (ref 78.0–100.0)
Monocytes Absolute: 0.5 10*3/uL (ref 0.1–1.0)
Monocytes Relative: 5.1 % (ref 3.0–12.0)
Neutro Abs: 5.7 10*3/uL (ref 1.4–7.7)
Neutrophils Relative %: 63.4 % (ref 43.0–77.0)
Platelets: 262 10*3/uL (ref 150.0–400.0)
RBC: 4.14 Mil/uL — ABNORMAL LOW (ref 4.22–5.81)
RDW: 14 % (ref 11.5–15.5)
WBC: 9 10*3/uL (ref 4.0–10.5)

## 2020-06-07 LAB — HEPATIC FUNCTION PANEL
ALT: 13 U/L (ref 0–53)
AST: 18 U/L (ref 0–37)
Albumin: 4.4 g/dL (ref 3.5–5.2)
Alkaline Phosphatase: 40 U/L (ref 39–117)
Bilirubin, Direct: 0 mg/dL (ref 0.0–0.3)
Total Bilirubin: 0.3 mg/dL (ref 0.2–1.2)
Total Protein: 7.1 g/dL (ref 6.0–8.3)

## 2020-06-07 LAB — LDL CHOLESTEROL, DIRECT: Direct LDL: 71 mg/dL

## 2020-06-07 LAB — HEMOGLOBIN A1C: Hgb A1c MFr Bld: 6.3 % (ref 4.6–6.5)

## 2020-06-07 MED ORDER — VITAMIN B-12 1000 MCG PO TABS
1000.0000 ug | ORAL_TABLET | Freq: Every day | ORAL | 3 refills | Status: DC
Start: 2020-06-07 — End: 2022-01-04

## 2020-06-07 MED ORDER — VITAMIN D (ERGOCALCIFEROL) 1.25 MG (50000 UNIT) PO CAPS: 50000.0000 [IU] | ORAL_CAPSULE | ORAL | 0 refills | Status: DC

## 2020-06-07 NOTE — Progress Notes (Signed)
Subjective:    Patient ID: Darrell Phoenix., male    DOB: 04-17-1951, 69 y.o.   MRN: [id number]  HPI  Here to f/u; overall doing ok,  Pt denies chest pain, increasing sob or doe, wheezing, orthopnea, PND, increased LE swelling, palpitations, dizziness or syncope.  Pt denies new neurological symptoms such as new headache, or facial or extremity weakness or numbness.  Pt denies polydipsia, polyuria, or low sugar episode.  Pt states overall good compliance with meds, mostly trying to follow appropriate diet, with wt overall down many lbs without trying.  Denies worsening depressive symptoms, suicidal ideation, or panic.   Pt denies fever, night sweats, loss of appetite, or other constitutional symptoms Denies worsening reflux, other abd pain, dysphagia, n/v, or blood. Wt Readings from Last 3 Encounters:  06/07/20 180 lb (81.6 kg)  11/04/19 190 lb (86.2 kg)  07/21/19 225 lb (102.1 kg)   Past Medical History:  Diagnosis Date  . ALCOHOL ABUSE, HX OF 10/03/2007  . ALLERGIC RHINITIS 10/03/2007  . ANEMIA-B12 DEFICIENCY 12/04/2009  . ANEMIA-IRON DEFICIENCY 11/27/2009  . ANEMIA-NOS 10/08/2007  . ASTHMA 10/03/2007  . B12 DEFICIENCY 10/06/2008  . CKD (chronic kidney disease) stage 3, GFR 30-59 ml/min 03/31/2018  . COLONIC POLYPS, HX OF 10/03/2007  . ERECTILE DYSFUNCTION 10/03/2007  . GERD 10/03/2007  . HYPERLIPIDEMIA 10/03/2007  . HYPERTENSION 10/03/2007  . KNEE PAIN, RIGHT 10/08/2007  . Morbid obesity ([address]) 10/03/2007  . Substance abuse ([place])    marijuana daily   Past Surgical History:  Procedure Laterality Date  . bilateral hydrocele repair    . COLONOSCOPY  06/06/2016  . POLYPECTOMY  01-03-2010  . s/p left knee arthroscopy  2009  . s/p TKR  03/2009  . VASECTOMY      reports that he has never smoked. He has never used smokeless tobacco. He reports previous alcohol use. He reports current drug use. Drug: Marijuana. family history includes Alcohol abuse in his paternal uncle; Breast cancer  in his mother; Coronary artery disease in an other family member; Diabetes in his cousin and sister; Hypertension in his sister; Lupus in his sister. No Known Allergies Current Outpatient Medications on File Prior to Visit  Medication Sig Dispense Refill  . albuterol (PROVENTIL HFA) 108 (90 Base) MCG/ACT inhaler Inhale 2 puffs into the lungs every 4 (four) hours as needed. 18 g 2  . ASPIRIN LOW DOSE 81 MG EC tablet TAKE 1 TABLET(81 MG) BY MOUTH DAILY. SWALLOW WHOLE 90 tablet 2  . CIALIS 20 MG tablet TAKE 1 TABLET BY MOUTH DAILY AS NEEDED 5 tablet 11  . fluticasone (FLONASE) 50 MCG/ACT nasal spray Place 2 sprays into both nostrils daily. 16 g 5  . Fluticasone-Salmeterol (WIXELA INHUB) 500-50 MCG/DOSE AEPB Inhale 1 puff into the lungs 2 (two) times daily. 60 each 11  . losartan-hydrochlorothiazide (HYZAAR) 100-12.5 MG tablet Take 1 tablet by mouth daily. 90 tablet 1  . omeprazole (PRILOSEC) 20 MG capsule TAKE ONE CAPSULE BY MOUTH TWICE DAILY 60 capsule 11   No current facility-administered medications on file prior to visit.   Review of Systems All otherwise neg per pt     Objective:   Physical Exam BP 102/78 (BP Location: Left Arm, Patient Position: Sitting, Cuff Size: Large)   Pulse 74   Temp 98.3 F (36.8 C) (Oral)   Ht 5\' 6"  (1.676 m)   Wt 180 lb (81.6 kg)   SpO2 99%   BMI 29.05 kg/m  VS noted,  Constitutional: Pt  appears in NAD HENT: Head: NCAT.  Right Ear: External ear normal.  Left Ear: External ear normal.  Eyes: . Pupils are equal, round, and reactive to light. Conjunctivae and EOM are normal Nose: without d/c or deformity Neck: Neck supple. Gross normal ROM Cardiovascular: Normal rate and regular rhythm.   Pulmonary/Chest: Effort normal and breath sounds without rales or wheezing.  Abd:  Soft, NT, ND, + BS, no organomegaly Neurological: Pt is alert. At baseline orientation, motor grossly intact Skin: Skin is warm. No rashes, other new lesions, no LE  edema Psychiatric: Pt behavior is normal without agitation  All otherwise neg per pt Lab Results  Component Value Date   WBC 9.0 06/07/2020   HGB 11.5 (L) 06/07/2020   HCT 34.2 (L) 06/07/2020   PLT 262.0 06/07/2020   GLUCOSE 88 06/07/2020   CHOL 153 06/07/2020   TRIG 284.0 (H) 06/07/2020   HDL 37.60 (L) 06/07/2020   LDLDIRECT 71.0 06/07/2020   LDLCALC 81 09/25/2017   ALT 13 06/07/2020   AST 18 06/07/2020   NA 135 06/07/2020   K 3.9 06/07/2020   CL 100 06/07/2020   CREATININE 1.42 06/07/2020   BUN 20 06/07/2020   CO2 28 06/07/2020   TSH 3.45 06/07/2020   PSA 1.00 11/04/2019   INR 2.5 (H) 04/23/2009   HGBA1C 6.3 06/07/2020      Assessment & Plan:

## 2020-06-07 NOTE — Patient Instructions (Signed)
Please take Vitamin D 50000 units weekly for 12 weeks, then plan to change to OTC Vitamin D3 at 2000 units per day, indefinitely.  Please also take B12 1000 mcg per day  Please continue all other medications as before, and refills have been done if requested.  Please have the pharmacy call with any other refills you may need.  Please continue your efforts at being more active, low cholesterol diet, and weight control.  Please keep your appointments with your specialists as you may have planned  You will be contacted regarding the referral for: CT scan  Please go to the XRAY Department in the first floor for the x-ray testing - at the [facility] site  Please go to the LAB at the blood drawing area for the tests to be done - at the [place] will be contacted by phone if any changes need to be made immediately.  Otherwise, you will receive a letter about your results with an explanation, but please check with MyChart first.  Please remember to sign up for MyChart if you have not done so, as this will be important to you in the future with finding out test results, communicating by private email, and scheduling acute appointments online when needed.  Please make an Appointment to return in 6 months, or sooner if needed

## 2020-06-08 LAB — VITAMIN B12: Vitamin B-12: 161 pg/mL — ABNORMAL LOW (ref 211–911)

## 2020-06-08 LAB — VITAMIN D 25 HYDROXY (VIT D DEFICIENCY, FRACTURES): VITD: 10.82 ng/mL — ABNORMAL LOW (ref 30.00–100.00)

## 2020-06-08 LAB — TSH: TSH: 3.45 u[IU]/mL (ref 0.35–4.50)

## 2020-06-18 ENCOUNTER — Encounter: Payer: Self-pay | Admitting: Internal Medicine

## 2020-06-18 NOTE — Assessment & Plan Note (Signed)
stable overall by history and exam, recent data reviewed with pt, and pt to continue medical treatment as before,  to f/u any worsening symptoms or concerns  

## 2020-06-18 NOTE — Assessment & Plan Note (Addendum)
Etiology unclear, for ct abd/pelvis, films today, labs as ordered,  to f/u any worsening symptoms or concerns  I spent 41 minutes in preparing to see the patient by review of recent labs, imaging and procedures, obtaining and reviewing separately obtained history, communicating with the patient and family or caregiver, ordering medications, tests or procedures, and documenting clinical information in the EHR including the differential Dx, treatment, and any further evaluation and other management of wt loss, vit d def, hld, hyperglycemia, gerd, htn, ckd, b12 deficiency

## 2020-06-18 NOTE — Assessment & Plan Note (Signed)
For f/u lab 

## 2020-06-18 NOTE — Assessment & Plan Note (Signed)
For contd oral replacement 

## 2020-06-22 ENCOUNTER — Encounter: Payer: Self-pay | Admitting: Internal Medicine

## 2020-06-22 ENCOUNTER — Ambulatory Visit
Admission: RE | Admit: 2020-06-22 | Discharge: 2020-06-22 | Disposition: A | Discharge status: Home / Self Care | Payer: Federal, State, Local not specified - PPO | Source: Ambulatory Visit | Attending: Internal Medicine | Admitting: Internal Medicine

## 2020-06-22 DIAGNOSIS — K449 Diaphragmatic hernia without obstruction or gangrene: Secondary | ICD-10-CM | POA: Diagnosis not present

## 2020-06-22 DIAGNOSIS — R140 Abdominal distension (gaseous): Secondary | ICD-10-CM | POA: Diagnosis not present

## 2020-06-22 MED ORDER — IOPAMIDOL (ISOVUE-300) INJECTION 61%
100.0000 mL | Freq: Once | INTRAVENOUS | Status: AC | PRN
  Administered 2020-06-22: 100 mL via INTRAVENOUS

## 2020-06-26 ENCOUNTER — Other Ambulatory Visit: Payer: Self-pay | Admitting: Internal Medicine

## 2020-07-25 ENCOUNTER — Telehealth: Payer: Self-pay

## 2020-07-25 NOTE — Telephone Encounter (Signed)
Patient came into office to drop off FMLA forms. States that he would like a phone call once completed and faxed. Paperwork placed in Brittany's box.

## 2020-07-31 NOTE — Telephone Encounter (Signed)
LVM for patient to call back and ask for [place].   I have questions about his FMLA request.

## 2020-08-01 NOTE — Telephone Encounter (Signed)
Spoke with patient, He is just requesting a renewal.   Forms have been completed &Placed in providers box to review and sign.

## 2020-08-04 DIAGNOSIS — Z0279 Encounter for issue of other medical certificate: Secondary | ICD-10-CM

## 2020-08-04 NOTE — Telephone Encounter (Signed)
Forms have been signed, Faxed, Copy sent to scan, &Charged for.   LVM to inform patient, &Orginail is ready to be picked up. If he would like for me to mail it we can.

## 2020-08-09 ENCOUNTER — Ambulatory Visit: Payer: Federal, State, Local not specified - PPO | Admitting: Internal Medicine

## 2020-08-09 ENCOUNTER — Encounter: Payer: Self-pay | Admitting: Internal Medicine

## 2020-08-09 ENCOUNTER — Other Ambulatory Visit: Payer: Self-pay

## 2020-08-09 DIAGNOSIS — E538 Deficiency of other specified B group vitamins: Secondary | ICD-10-CM | POA: Diagnosis not present

## 2020-08-09 DIAGNOSIS — F1220 Cannabis dependence, uncomplicated: Secondary | ICD-10-CM | POA: Insufficient documentation

## 2020-08-09 DIAGNOSIS — I10 Essential (primary) hypertension: Secondary | ICD-10-CM

## 2020-08-09 DIAGNOSIS — R634 Abnormal weight loss: Secondary | ICD-10-CM

## 2020-08-09 DIAGNOSIS — R1310 Dysphagia, unspecified: Secondary | ICD-10-CM | POA: Diagnosis not present

## 2020-08-09 DIAGNOSIS — N183 Chronic kidney disease, stage 3 unspecified: Secondary | ICD-10-CM

## 2020-08-09 MED ORDER — CYANOCOBALAMIN 1000 MCG/ML IJ SOLN
1000.0000 ug | Freq: Once | INTRAMUSCULAR | Status: AC
  Administered 2020-08-09: 1000 ug via INTRAMUSCULAR

## 2020-08-09 NOTE — Patient Instructions (Signed)
Do not take Losartan HCT Drink BOOST twice a day Hydrate well

## 2020-08-09 NOTE — Assessment & Plan Note (Signed)
8/21 x 6 mo ?>etiol Ba swallow Labs Will probably need EGD  Boost bid

## 2020-08-09 NOTE — Assessment & Plan Note (Signed)
Chronic use No change 1/day

## 2020-08-09 NOTE — Assessment & Plan Note (Signed)
Hydrate better Hold Losartan HCT - low BP

## 2020-08-09 NOTE — Assessment & Plan Note (Signed)
Low BP Hold BP meds

## 2020-08-09 NOTE — Assessment & Plan Note (Addendum)
8/21 x 6 mo ?>etiol. C/o dysphagia - the food woud not go down w/o water...  Ba swallow Labs Will probably need EGD  R/o ALS, MG etc

## 2020-08-09 NOTE — Assessment & Plan Note (Signed)
On B12 

## 2020-08-09 NOTE — Progress Notes (Signed)
Subjective:  Patient ID: Darrell Hoffman., male    DOB: 05-19-1951  Age: 69 y.o. MRN: [id number]  CC: No chief complaint on file.   HPI Darrell Hoffman. presents for wt loss, fatigue, no appetite x 5-6 months C/o weakness in arms, legs, vertigo. C/o dysphagia - the food woud not go down w/o water...  Abd CT, CXR, labs were ok     Outpatient Medications Prior to Visit  Medication Sig Dispense Refill  . albuterol (PROVENTIL HFA) 108 (90 Base) MCG/ACT inhaler Inhale 2 puffs into the lungs every 4 (four) hours as needed. 18 g 2  . ASPIRIN LOW DOSE 81 MG EC tablet TAKE 1 TABLET(81 MG) BY MOUTH DAILY. SWALLOW WHOLE 90 tablet 2  . CIALIS 20 MG tablet TAKE 1 TABLET BY MOUTH DAILY AS NEEDED 5 tablet 11  . fluticasone (FLONASE) 50 MCG/ACT nasal spray Place 2 sprays into both nostrils daily. 16 g 5  . Fluticasone-Salmeterol (WIXELA INHUB) 500-50 MCG/DOSE AEPB Inhale 1 puff into the lungs 2 (two) times daily. 60 each 11  . losartan-hydrochlorothiazide (HYZAAR) 100-12.5 MG tablet TAKE 1 TABLET BY MOUTH DAILY 90 tablet 1  . omeprazole (PRILOSEC) 20 MG capsule TAKE 1 CAPSULE BY MOUTH TWICE DAILY 60 capsule 11  . vitamin B-12 (CYANOCOBALAMIN) 1000 MCG tablet Take 1 tablet (1,000 mcg total) by mouth daily. 90 tablet 3  . Vitamin D, Ergocalciferol, (DRISDOL) 1.25 MG (50000 UNIT) CAPS capsule Take 1 capsule (50,000 Units total) by mouth every 7 (seven) days. 12 capsule 0   No facility-administered medications prior to visit.    ROS: Review of Systems  Constitutional: Positive for fatigue and unexpected weight change. Negative for appetite change.  HENT: Negative for congestion, nosebleeds, sneezing, sore throat and trouble swallowing.   Eyes: Negative for itching and visual disturbance.  Respiratory: Negative for cough.   Cardiovascular: Negative for chest pain, palpitations and leg swelling.  Gastrointestinal: Negative for abdominal distention, blood in stool, diarrhea, nausea and vomiting.    Genitourinary: Negative for frequency and hematuria.  Musculoskeletal: Negative for arthralgias, back pain, gait problem, joint swelling, myalgias and neck pain.  Skin: Negative for rash.  Neurological: Positive for weakness. Negative for dizziness, tremors and speech difficulty.  Psychiatric/Behavioral: Negative for agitation, dysphoric mood and sleep disturbance. The patient is not nervous/anxious.     Objective:  BP (!) 88/56 (BP Location: Right Arm, Patient Position: Sitting, Cuff Size: Normal)   Pulse (!) 102   Temp 98.5 F (36.9 C) (Oral)   Ht 5\' 6"  (1.676 m)   Wt 153 lb (69.4 kg)   SpO2 98%   BMI 24.69 kg/m   BP Readings from Last 3 Encounters:  08/09/20 (!) 88/56  06/07/20 102/78  11/04/19 126/80    Wt Readings from Last 3 Encounters:  08/09/20 153 lb (69.4 kg)  06/07/20 180 lb (81.6 kg)  11/04/19 190 lb (86.2 kg)    Physical Exam Constitutional:      General: He is not in acute distress.    Appearance: He is well-developed.     Comments: NAD  Eyes:     Conjunctiva/sclera: Conjunctivae normal.     Pupils: Pupils are equal, round, and reactive to light.  Neck:     Thyroid: No thyromegaly.     Vascular: No JVD.  Cardiovascular:     Rate and Rhythm: Normal rate and regular rhythm.     Heart sounds: Normal heart sounds. No murmur heard.  No friction rub. No gallop.  Pulmonary:     Effort: Pulmonary effort is normal. No respiratory distress.     Breath sounds: Normal breath sounds. No wheezing or rales.  Chest:     Chest wall: No tenderness.  Abdominal:     General: Bowel sounds are normal. There is no distension.     Palpations: Abdomen is soft. There is no mass.     Tenderness: There is no abdominal tenderness. There is no guarding or rebound.  Musculoskeletal:        General: No tenderness. Normal range of motion.     Cervical back: Normal range of motion.  Lymphadenopathy:     Cervical: No cervical adenopathy.  Skin:    General: Skin is warm and  dry.     Findings: No rash.  Neurological:     Mental Status: He is alert and oriented to person, place, and time.     Cranial Nerves: No cranial nerve deficit.     Motor: No abnormal muscle tone.     Coordination: Coordination normal.     Gait: Gait normal.     Deep Tendon Reflexes: Reflexes are normal and symmetric.  Psychiatric:        Behavior: Behavior normal.        Thought Content: Thought content normal.        Judgment: Judgment normal.   a complex case  Lab Results  Component Value Date   WBC 9.0 06/07/2020   HGB 11.5 (L) 06/07/2020   HCT 34.2 (L) 06/07/2020   PLT 262.0 06/07/2020   GLUCOSE 88 06/07/2020   CHOL 153 06/07/2020   TRIG 284.0 (H) 06/07/2020   HDL 37.60 (L) 06/07/2020   LDLDIRECT 71.0 06/07/2020   LDLCALC 81 09/25/2017   ALT 13 06/07/2020   AST 18 06/07/2020   NA 135 06/07/2020   K 3.9 06/07/2020   CL 100 06/07/2020   CREATININE 1.42 06/07/2020   BUN 20 06/07/2020   CO2 28 06/07/2020   TSH 3.45 06/07/2020   PSA 1.00 11/04/2019   INR 2.5 (H) 04/23/2009   HGBA1C 6.3 06/07/2020    CT ABDOMEN PELVIS W CONTRAST  Result Date: 06/22/2020 CLINICAL DATA:  Abdominal distension and weight loss EXAM: CT ABDOMEN AND PELVIS WITH CONTRAST TECHNIQUE: Multidetector CT imaging of the abdomen and pelvis was performed using the standard protocol following bolus administration of intravenous contrast. CONTRAST:  137mL ISOVUE-300 IOPAMIDOL (ISOVUE-300) INJECTION 61% COMPARISON:  None. FINDINGS: Lower chest: Lung bases demonstrate a large hiatal hernia with the entire stomach in the right chest cavity. Portions of the transverse colon are noted within the hernia as well. Hepatobiliary: No focal liver abnormality is seen. No gallstones, gallbladder wall thickening, or biliary dilatation. Pancreas: Unremarkable. No pancreatic ductal dilatation or surrounding inflammatory changes. Spleen: Normal in size without focal abnormality. Adrenals/Urinary Tract: Adrenal glands appear  within normal limits. Kidneys demonstrate a normal enhancement pattern. No renal calculi or urinary tract obstructive changes are seen. Central simple appearing left renal cyst is noted measuring 2.4 cm. No obstructive changes are seen. The bladder is decompressed. Stomach/Bowel: Colon shows no obstructive or inflammatory changes. Again a loop of transverse colon is noted within the large hiatal hernia although this causes no obstructive change. The appendix is within normal limits. Small bowel shows no obstructive or inflammatory changes. Stomach is within the right chest cavity consistent with hernia. Vascular/Lymphatic: Left retroaortic renal vein is noted. Abdominal aorta demonstrates mild atherosclerotic calcifications without aneurysmal dilatation. No sizable lymphadenopathy is noted. Reproductive: Prostate is  unremarkable. Other: No abdominal wall hernia or abnormality. No abdominopelvic ascites. Musculoskeletal: No acute or significant osseous findings. IMPRESSION: Large hiatal hernia as described. No acute complicating factors are noted. Chronic changes without acute abnormality. Electronically Signed   By: Inez Catalina M.D.   On: 06/22/2020 20:07    Assessment & Plan:   There are no diagnoses linked to this encounter.   No orders of the defined types were placed in this encounter.    Follow-up: No follow-ups on file.  Walker Kehr, MD

## 2020-08-09 NOTE — Addendum Note (Signed)
Addended by: Darlys Gales on: 08/09/2020 05:07 PM   Modules accepted: Orders

## 2020-08-10 ENCOUNTER — Ambulatory Visit: Payer: Federal, State, Local not specified - PPO | Admitting: Nurse Practitioner

## 2020-08-10 ENCOUNTER — Encounter: Payer: Self-pay | Admitting: Nurse Practitioner

## 2020-08-10 DIAGNOSIS — R1310 Dysphagia, unspecified: Secondary | ICD-10-CM | POA: Diagnosis not present

## 2020-08-10 DIAGNOSIS — R634 Abnormal weight loss: Secondary | ICD-10-CM | POA: Diagnosis not present

## 2020-08-10 NOTE — Patient Instructions (Addendum)
If you are age 68 or older, your body mass index should be between 23-30. Your Body mass index is 26 kg/m. If this is out of the aforementioned range listed, please consider follow up with your Primary Care Provider.  If you are age 37 or younger, your body mass index should be between 19-25. Your Body mass index is 26 kg/m. If this is out of the aformentioned range listed, please consider follow up with your Primary Care Provider.   You have been scheduled for an endoscopy. Please follow written instructions given to you at your visit today. If you use inhalers (even only as needed), please bring them with you on the day of your procedure.  You have been scheduled for an Upper GI Series at [facility] (first floor). Your appointment is on 08-25-20 at 9:30am. Please arrive 15 minutes prior to your test for registration. Make sure not to eat or drink anything after midnight on the night before your test. If you need to reschedule, please call radiology at [phone]. __________________________________________________________ An upper GI series uses x rays to help diagnose problems of the upper GI tract, which includes the esophagus, stomach, and duodenum. The duodenum is the first part of the small intestine. An upper GI series is conducted by a radiology technologist or a radiologist--a doctor who specializes in x-ray imaging--at a hospital or outpatient center. While sitting or standing in front of an x-ray machine, the patient drinks barium liquid, which is often white and has a chalky consistency and taste. The barium liquid coats the lining of the upper GI tract and makes signs of disease show up more clearly on x rays. X-ray video, called fluoroscopy, is used to view the barium liquid moving through the esophagus, stomach, and duodenum. Additional x rays and fluoroscopy are performed while the patient lies on an x-ray table. To fully coat the upper GI tract with barium liquid, the  technologist or radiologist may press on the abdomen or ask the patient to change position. Patients hold still in various positions, allowing the technologist or radiologist to take x rays of the upper GI tract at different angles. If a technologist conducts the upper GI series, a radiologist will later examine the images to look for problems.  This test typically takes about 1 hour to complete. __________________________________________________________ Due to recent changes in healthcare laws, you may see the results of your imaging and laboratory studies on MyChart before your provider has had a chance to review them.  We understand that in some cases there may be results that are confusing or concerning to you. Not all laboratory results come back in the same time frame and the provider may be waiting for multiple results in order to interpret others.  Please give [place] 48 hours in order for your provider to thoroughly review all the results before contacting the office for clarification of your results.   Thank you for entrusting me with your care and choosing [facility].  Nevin Bloodgood, NP

## 2020-08-10 NOTE — Progress Notes (Signed)
Reviewed and agree with management plan.  Amere Iott T. Jerren Flinchbaugh, MD FACG [facility]  

## 2020-08-10 NOTE — Progress Notes (Signed)
IMPRESSION and PLAN:    # Significant unintentional weight loss / vague complaints of dysphagia.  --Large hiatal hernia containing entire stomach in chest ( and part of transverse colon) on CT scan could be contributing to his symptoms. For further evaluation with arrange for esophagram. He may need Surgical evaluation depending on results.  --Additional patient will need endoscopic evaluation of symptoms and CT scan findings. Will schedule for EGD. The risks and benefits of EGD were discussed and the patient agrees to proceed.   # GERD / large hiatal hernia --Asymptomatic on BID PPI.     HPI:    Primary GI:  Darrell Edward, MD   Chief complaint : Poor appetite, problems swallowing  This patient is a 69 year old male with PMH significant for but not limited to CKD, hypertension, hyperlipidemia, alcohol abuse, colon polyps ( last time in 2020) , and diverticulosis.  He is known to Dr. Fuller Plan for history of adenomatous colon polyps and is due for 5 year recall colonoscopy in 2025. Patient is here now for evaluation of weight loss, vague difficulty swallowing.  He has lost 69 pounds unintentionally over the last year, 24 of those pounds have been within the last 2 months.  Patient describes not eating properly for several months.  He apologizes for being unable to give a better description of his symptoms.  He feels like food is not going down smoothly when he eats.  Feels the need to wash food down with fluids but hasn't had the sensation that food is getting stuck in his esophagus.  He gets full from fluids used to chase solids so eats less in addition to having a diminished appetite.  No nausea, vomiting or abdominal pain.  Patient has not recently started any new medications.  No unusual life stressors.  Patient gives a history of GERD, symptoms controlled on twice daily PPI.  No history of smoking.  His symptoms and weight loss have been under evaluation by PCP.  CT scan in July showed a  large hiatal hernia with entire stomach in his right chest cavity and portions of the transverse colon within the hernia without obstruction  Data Reviewed:  06/07/2020 CBC okay.  Hemoglobin at baseline 11.5 CMP okay   PREVIOUS GI EVALUATIONS / GI studies: 06/22/20 CT scan  abd/ pelvis with contrast EXAM: CT ABDOMEN AND PELVIS WITH CONTRAST  TECHNIQUE: Multidetector CT imaging of the abdomen and pelvis was performed using the standard protocol following bolus administration of intravenous contrast.  CONTRAST:  176mL ISOVUE-300 IOPAMIDOL (ISOVUE-300) INJECTION 61%  COMPARISON:  None.  FINDINGS: Lower chest: Lung bases demonstrate a large hiatal hernia with the entire stomach in the right chest cavity. Portions of the transverse colon are noted within the hernia as without obstruction  Hepatobiliary: No focal liver abnormality is seen. No gallstones, gallbladder wall thickening, or biliary dilatation.  Pancreas: Unremarkable. No pancreatic ductal dilatation or surrounding inflammatory changes.  Spleen: Normal in size without focal abnormality.  Adrenals/Urinary Tract: Adrenal glands appear within normal limits. Kidneys demonstrate a normal enhancement pattern. No renal calculi or urinary tract obstructive changes are seen. Central simple appearing left renal cyst is noted measuring 2.4 cm. No obstructive changes are seen. The bladder is decompressed.  Stomach/Bowel: Colon shows no obstructive or inflammatory changes. Again a loop of transverse colon is noted within the large hiatal hernia although this causes no obstructive change. The appendix is within normal limits. Small bowel shows no obstructive or inflammatory changes. Stomach  is within the right chest cavity consistent with hernia.  Vascular/Lymphatic: Left retroaortic renal vein is noted. Abdominal aorta demonstrates mild atherosclerotic calcifications without aneurysmal dilatation. No sizable  lymphadenopathy is noted.  Reproductive: Prostate is unremarkable.  Other: No abdominal wall hernia or abnormality. No abdominopelvic ascites.  Musculoskeletal: No acute or significant osseous findings.  IMPRESSION: Large hiatal hernia as described. No acute complicating factors are noted. Chronic changes without acute abnormality.   07/08/2019 colonoscopy -Two 6 to 7 mm polyps in the descending colon and in the ascending colon, removed with a cold snare. Resected and retrieved. - Diverticulosis in the transverse colon. - Internal hemorrhoids. - The examination was otherwise normal on direct and retroflexion views.  Surgical [P], colon, ascending, descending, polyp (2) - TUBULAR ADENOMA(S). - HIGH GRADE DYSPLASIA IS NOT IDENTIFIED   Review of systems:     No chest pain, no SOB, no fevers, no urinary sx   Past Medical History:  Diagnosis Date  . ALCOHOL ABUSE, HX OF 10/03/2007  . ALLERGIC RHINITIS 10/03/2007  . ANEMIA-B12 DEFICIENCY 12/04/2009  . ANEMIA-IRON DEFICIENCY 11/27/2009  . ANEMIA-NOS 10/08/2007  . ASTHMA 10/03/2007  . B12 DEFICIENCY 10/06/2008  . CKD (chronic kidney disease) stage 3, GFR 30-59 ml/min 03/31/2018  . COLONIC POLYPS, HX OF 10/03/2007  . ERECTILE DYSFUNCTION 10/03/2007  . GERD 10/03/2007  . HYPERLIPIDEMIA 10/03/2007  . HYPERTENSION 10/03/2007  . KNEE PAIN, RIGHT 10/08/2007  . Morbid obesity ([place]) 10/03/2007  . Substance abuse ([place])    marijuana daily    Patient's surgical history, family medical history, social history, medications and allergies were all reviewed in Epic   Creatinine clearance cannot be calculated (Patient's most recent lab result is older than the maximum 21 days allowed.)  Current Outpatient Medications  Medication Sig Dispense Refill  . albuterol (PROVENTIL HFA) 108 (90 Base) MCG/ACT inhaler Inhale 2 puffs into the lungs every 4 (four) hours as needed. 18 g 2  . ASPIRIN LOW DOSE 81 MG EC tablet TAKE 1 TABLET(81 MG)  BY MOUTH DAILY. SWALLOW WHOLE 90 tablet 2  . CIALIS 20 MG tablet TAKE 1 TABLET BY MOUTH DAILY AS NEEDED 5 tablet 11  . fluticasone (FLONASE) 50 MCG/ACT nasal spray Place 2 sprays into both nostrils daily. 16 g 5  . Fluticasone-Salmeterol (WIXELA INHUB) 500-50 MCG/DOSE AEPB Inhale 1 puff into the lungs 2 (two) times daily. 60 each 11  . omeprazole (PRILOSEC) 20 MG capsule TAKE 1 CAPSULE BY MOUTH TWICE DAILY 60 capsule 11  . vitamin B-12 (CYANOCOBALAMIN) 1000 MCG tablet Take 1 tablet (1,000 mcg total) by mouth daily. 90 tablet 3  . Vitamin D, Ergocalciferol, (DRISDOL) 1.25 MG (50000 UNIT) CAPS capsule Take 1 capsule (50,000 Units total) by mouth every 7 (seven) days. 12 capsule 0   No current facility-administered medications for this visit.    Filed Weights   08/10/20 0937  Weight: 156 lb 4 oz (70.9 kg)    Physical Exam:     BP 100/72   Pulse 76   Ht 5\' 5"  (1.651 m)   Wt 156 lb 4 oz (70.9 kg)   BMI 26.00 kg/m   GENERAL:  Pleasant male in NAD PSYCH: : Cooperative, normal affect CARDIAC:  RRR PULM: Normal respiratory effort, lungs CTA bilaterally, no wheezing ABDOMEN:  Nondistended, soft, nontender. No obvious masses, no hepatomegaly,  normal bowel sounds SKIN:  turgor, no lesions seen Musculoskeletal:  Normal muscle tone, normal strength NEURO: Alert and oriented x 3, no focal neurologic deficits  [name] Savoy , NP 08/10/2020, 9:55 AM

## 2020-08-11 LAB — CBC WITH DIFFERENTIAL/PLATELET
Absolute Monocytes: 510 cells/uL (ref 200–950)
Basophils Absolute: 20 cells/uL (ref 0–200)
Basophils Relative: 0.2 %
Eosinophils Absolute: 29 cells/uL (ref 15–500)
Eosinophils Relative: 0.3 %
HCT: 40.6 % (ref 38.5–50.0)
Hemoglobin: 13.1 g/dL — ABNORMAL LOW (ref 13.2–17.1)
Lymphs Abs: 3381 cells/uL (ref 850–3900)
MCH: 27.2 pg (ref 27.0–33.0)
MCHC: 32.3 g/dL (ref 32.0–36.0)
MCV: 84.2 fL (ref 80.0–100.0)
MPV: 9.9 fL (ref 7.5–12.5)
Monocytes Relative: 5.2 %
Neutro Abs: 5860 cells/uL (ref 1500–7800)
Neutrophils Relative %: 59.8 %
Platelets: 274 10*3/uL (ref 140–400)
RBC: 4.82 10*6/uL (ref 4.20–5.80)
RDW: 13.2 % (ref 11.0–15.0)
Total Lymphocyte: 34.5 %
WBC: 9.8 10*3/uL (ref 3.8–10.8)

## 2020-08-11 LAB — COMPLETE METABOLIC PANEL WITH GFR
AG Ratio: 1.4 (calc) (ref 1.0–2.5)
ALT: 10 U/L (ref 9–46)
AST: 16 U/L (ref 10–35)
Albumin: 4.2 g/dL (ref 3.6–5.1)
Alkaline phosphatase (APISO): 43 U/L (ref 35–144)
BUN/Creatinine Ratio: 13 (calc) (ref 6–22)
BUN: 29 mg/dL — ABNORMAL HIGH (ref 7–25)
CO2: 23 mmol/L (ref 20–32)
Calcium: 10 mg/dL (ref 8.6–10.3)
Chloride: 103 mmol/L (ref 98–110)
Creat: 2.28 mg/dL — ABNORMAL HIGH (ref 0.70–1.25)
GFR, Est African American: 33 mL/min/{1.73_m2} — ABNORMAL LOW (ref >=60)
GFR, Est Non African American: 28 mL/min/{1.73_m2} — ABNORMAL LOW (ref >=60)
Globulin: 3 g/dL (calc) (ref 1.9–3.7)
Glucose, Bld: 112 mg/dL — ABNORMAL HIGH (ref 65–99)
Potassium: 4.1 mmol/L (ref 3.5–5.3)
Sodium: 138 mmol/L (ref 135–146)
Total Bilirubin: 0.4 mg/dL (ref 0.2–1.2)
Total Protein: 7.2 g/dL (ref 6.1–8.1)

## 2020-08-11 LAB — EXTRA SPECIMEN

## 2020-08-11 LAB — SPECIMEN COMPROMISED

## 2020-08-11 LAB — SEDIMENTATION RATE: Sed Rate: 22 mm/h — ABNORMAL HIGH (ref 0–20)

## 2020-08-11 LAB — CK: Total CK: 251 U/L — ABNORMAL HIGH (ref 44–196)

## 2020-08-11 LAB — CORTISOL: Cortisol, Plasma: 18.2 ug/dL

## 2020-08-11 LAB — T3, FREE: T3, Free: 3.1 pg/mL (ref 2.3–4.2)

## 2020-08-11 LAB — ALDOLASE: Aldolase: 2.4 U/L (ref <=8.1)

## 2020-08-11 LAB — T4, FREE: Free T4: 1.4 ng/dL (ref 0.8–1.8)

## 2020-08-19 LAB — SPECIMEN STATUS REPORT

## 2020-08-19 LAB — ACETYLCHOLINE RECEPTOR AB, ALL
AChR Binding Ab, Serum: <0.03 nmol/L (ref 0.00–0.24)
Acetylchol Block Ab: 13 % (ref 0–25)
Acetylcholine Modulat Ab: <12 % (ref 0–20)

## 2020-08-23 ENCOUNTER — Other Ambulatory Visit: Payer: Self-pay

## 2020-08-23 ENCOUNTER — Encounter: Payer: Self-pay | Admitting: Internal Medicine

## 2020-08-23 ENCOUNTER — Ambulatory Visit: Payer: Federal, State, Local not specified - PPO | Admitting: Internal Medicine

## 2020-08-23 ENCOUNTER — Telehealth: Payer: Self-pay | Admitting: Gastroenterology

## 2020-08-23 ENCOUNTER — Encounter: Payer: Federal, State, Local not specified - PPO | Admitting: Gastroenterology

## 2020-08-23 VITALS — BP 102/70 | HR 95 | Temp 98.3°F | Ht 65.0 in | Wt 157.0 lb

## 2020-08-23 DIAGNOSIS — N183 Chronic kidney disease, stage 3 unspecified: Secondary | ICD-10-CM

## 2020-08-23 DIAGNOSIS — R739 Hyperglycemia, unspecified: Secondary | ICD-10-CM

## 2020-08-23 DIAGNOSIS — K219 Gastro-esophageal reflux disease without esophagitis: Secondary | ICD-10-CM | POA: Diagnosis not present

## 2020-08-23 DIAGNOSIS — M79604 Pain in right leg: Secondary | ICD-10-CM

## 2020-08-23 DIAGNOSIS — M79605 Pain in left leg: Secondary | ICD-10-CM

## 2020-08-23 DIAGNOSIS — I10 Essential (primary) hypertension: Secondary | ICD-10-CM | POA: Diagnosis not present

## 2020-08-23 NOTE — Progress Notes (Signed)
Subjective:    Patient ID: Darrell Hoffman., male    DOB: 1951-07-21, 69 y.o.   MRN: [id number]  HPI  Heretudy, then 45 egd to f/u; overall doing ok,  Pt denies chest pain, increasing sob or doe, wheezing, orthopnea, PND, increased LE swelling, palpitations, dizziness or syncope.  Pt denies new neurological symptoms such as new headache, or facial or extremity weakness or numbness.  Pt denies polydipsia, polyuria, or low sugar episode.  Pt states overall good compliance with meds, mostly trying to follow appropriate diet, with wt overall stable,  but little exercise however.  Has cool legs per pt worse in the past few weeks.   Wt Readings from Last 3 Encounters:  08/23/20 157 lb (71.2 kg)  08/10/20 156 lb 4 oz (70.9 kg)  08/09/20 153 lb (69.4 kg)  has ongoing wt loss, lower appetite, for ugi barium study, then 9/21 egd; peak wt has been 257 in past, now 157, has some constipation, no vomiting. Denies worsening reflux, abd pain, n/v, or blood. Past Medical History:  Diagnosis Date  . ALCOHOL ABUSE, HX OF 10/03/2007  . ALLERGIC RHINITIS 10/03/2007  . ANEMIA-B12 DEFICIENCY 12/04/2009  . ANEMIA-IRON DEFICIENCY 11/27/2009  . ANEMIA-NOS 10/08/2007  . ASTHMA 10/03/2007  . B12 DEFICIENCY 10/06/2008  . CKD (chronic kidney disease) stage 3, GFR 30-59 ml/min 03/31/2018  . COLONIC POLYPS, HX OF 10/03/2007  . ERECTILE DYSFUNCTION 10/03/2007  . GERD 10/03/2007  . HYPERLIPIDEMIA 10/03/2007  . HYPERTENSION 10/03/2007  . KNEE PAIN, RIGHT 10/08/2007  . Morbid obesity ([place]) 10/03/2007  . Substance abuse ([place])    marijuana daily   Past Surgical History:  Procedure Laterality Date  . bilateral hydrocele repair    . COLONOSCOPY  06/06/2016  . POLYPECTOMY  01-03-2010  . s/p left knee arthroscopy  2009  . s/p TKR  03/2009  . VASECTOMY      reports that he has never smoked. He has never used smokeless tobacco. He reports previous alcohol use. He reports current drug use. Drug: Marijuana. family history  includes Alcohol abuse in his paternal uncle; Breast cancer in his mother; Coronary artery disease in an other family member; Diabetes in his cousin and sister; Hypertension in his sister; Lupus in his sister. No Known Allergies Current Outpatient Medications on File Prior to Visit  Medication Sig Dispense Refill  . albuterol (PROVENTIL HFA) 108 (90 Base) MCG/ACT inhaler Inhale 2 puffs into the lungs every 4 (four) hours as needed. 18 g 2  . ASPIRIN LOW DOSE 81 MG EC tablet TAKE 1 TABLET(81 MG) BY MOUTH DAILY. SWALLOW WHOLE 90 tablet 2  . CIALIS 20 MG tablet TAKE 1 TABLET BY MOUTH DAILY AS NEEDED 5 tablet 11  . fluticasone (FLONASE) 50 MCG/ACT nasal spray Place 2 sprays into both nostrils daily. 16 g 5  . Fluticasone-Salmeterol (WIXELA INHUB) 500-50 MCG/DOSE AEPB Inhale 1 puff into the lungs 2 (two) times daily. 60 each 11  . omeprazole (PRILOSEC) 20 MG capsule TAKE 1 CAPSULE BY MOUTH TWICE DAILY 60 capsule 11  . vitamin B-12 (CYANOCOBALAMIN) 1000 MCG tablet Take 1 tablet (1,000 mcg total) by mouth daily. 90 tablet 3   No current facility-administered medications on file prior to visit.   Review of Systems All otherwise neg per pt    Objective:   Physical Exam BP 102/70 (BP Location: Left Arm, Patient Position: Sitting, Cuff Size: Large)   Pulse 95   Temp 98.3 F (36.8 C) (Oral)   Ht 5\' 5"  (  1.651 m)   Wt 157 lb (71.2 kg)   SpO2 98%   BMI 26.13 kg/m  VS noted,  Constitutional: Pt appears in NAD HENT: Head: NCAT.  Right Ear: External ear normal.  Left Ear: External ear normal.  Eyes: . Pupils are equal, round, and reactive to light. Conjunctivae and EOM are normal Nose: without d/c or deformity Neck: Neck supple. Gross normal ROM Cardiovascular: Normal rate and regular rhythm.   Pulmonary/Chest: Effort normal and breath sounds without rales or wheezing.  Abd:  Soft, NT, ND, + BS, no organomegaly Neurological: Pt is alert. At baseline orientation, motor grossly intact Skin: Skin  is warm. No rashes, other new lesions, no LE edema Psychiatric: Pt behavior is normal without agitation  All otherwise neg per pt Lab Results  Component Value Date   WBC 9.8 08/09/2020   HGB 13.1 (L) 08/09/2020   HCT 40.6 08/09/2020   PLT 274 08/09/2020   GLUCOSE 112 (H) 08/09/2020   CHOL 153 06/07/2020   TRIG 284.0 (H) 06/07/2020   HDL 37.60 (L) 06/07/2020   LDLDIRECT 71.0 06/07/2020   LDLCALC 81 09/25/2017   ALT 10 08/09/2020   AST 16 08/09/2020   NA 138 08/09/2020   K 4.1 08/09/2020   CL 103 08/09/2020   CREATININE 2.28 (H) 08/09/2020   BUN 29 (H) 08/09/2020   CO2 23 08/09/2020   TSH 3.45 06/07/2020   PSA 1.00 11/04/2019   INR 2.5 (H) 04/23/2009   HGBA1C 6.3 06/07/2020      Assessment & Plan:

## 2020-08-23 NOTE — Telephone Encounter (Signed)
Rescheduled EGD to 09/05/20 at 9 am. Martin Majestic over changes on instruction times with patient and he verbalized understanding.

## 2020-08-23 NOTE — Patient Instructions (Signed)
Please continue all other medications as before, and refills have been done if requested.  Please have the pharmacy call with any other refills you may need.  Please continue your efforts at being more active, low cholesterol diet, and weight control.  You are otherwise up to date with prevention measures today.  Please keep your appointments with your specialists as you may have planned - the barium testing, and the EGD  You will be contacted regarding the referral for: Circulation testing for the legs  Please make an Appointment to return in 3 months

## 2020-08-25 ENCOUNTER — Inpatient Hospital Stay (HOSPITAL_COMMUNITY): Admission: RE | Admit: 2020-08-25 | Payer: Federal, State, Local not specified - PPO | Source: Ambulatory Visit

## 2020-08-26 ENCOUNTER — Encounter: Payer: Self-pay | Admitting: Internal Medicine

## 2020-08-26 NOTE — Assessment & Plan Note (Addendum)
For arterial vascular study,  to f/u any worsening symptoms or concerns  I spent 31 minutes in preparing to see the patient by review of recent labs, imaging and procedures, obtaining and reviewing separately obtained history, communicating with the patient and family or caregiver, ordering medications, tests or procedures, and documenting clinical information in the EHR including the differential Dx, treatment, and any further evaluation and other management of bilateral leg pain, ckd, htn, gerd, hyperglycemia

## 2020-08-26 NOTE — Assessment & Plan Note (Signed)
stable overall by history and exam, recent data reviewed with pt, and pt to continue medical treatment as before,  to f/u any worsening symptoms or concerns  

## 2020-08-28 ENCOUNTER — Encounter: Payer: Self-pay | Admitting: Internal Medicine

## 2020-08-28 ENCOUNTER — Ambulatory Visit (HOSPITAL_COMMUNITY)
Admission: RE | Admit: 2020-08-28 | Discharge: 2020-08-28 | Disposition: A | Discharge status: Home / Self Care | Payer: Federal, State, Local not specified - PPO | Source: Ambulatory Visit | Attending: Internal Medicine | Admitting: Internal Medicine

## 2020-08-28 ENCOUNTER — Other Ambulatory Visit: Payer: Self-pay

## 2020-08-28 DIAGNOSIS — M79604 Pain in right leg: Secondary | ICD-10-CM | POA: Diagnosis not present

## 2020-08-28 DIAGNOSIS — M79605 Pain in left leg: Secondary | ICD-10-CM | POA: Diagnosis not present

## 2020-08-30 ENCOUNTER — Ambulatory Visit (HOSPITAL_COMMUNITY)
Admission: RE | Admit: 2020-08-30 | Discharge: 2020-08-30 | Disposition: A | Discharge status: Home / Self Care | Payer: Federal, State, Local not specified - PPO | Source: Ambulatory Visit | Attending: Nurse Practitioner | Admitting: Nurse Practitioner

## 2020-08-30 ENCOUNTER — Other Ambulatory Visit: Payer: Self-pay

## 2020-08-30 ENCOUNTER — Encounter (HOSPITAL_COMMUNITY): Payer: Self-pay

## 2020-08-30 DIAGNOSIS — R634 Abnormal weight loss: Secondary | ICD-10-CM

## 2020-08-30 DIAGNOSIS — R1310 Dysphagia, unspecified: Secondary | ICD-10-CM

## 2020-09-01 ENCOUNTER — Ambulatory Visit (HOSPITAL_COMMUNITY): Payer: Federal, State, Local not specified - PPO

## 2020-09-03 ENCOUNTER — Other Ambulatory Visit: Payer: Self-pay | Admitting: Internal Medicine

## 2020-09-03 NOTE — Telephone Encounter (Signed)
Please change to OTC Vitamin D3 at 2000 units per day, indefinitely.  

## 2020-09-04 ENCOUNTER — Other Ambulatory Visit: Payer: Self-pay

## 2020-09-04 ENCOUNTER — Ambulatory Visit (HOSPITAL_COMMUNITY)
Admission: RE | Admit: 2020-09-04 | Discharge: 2020-09-04 | Disposition: A | Discharge status: Home / Self Care | Payer: Federal, State, Local not specified - PPO | Source: Ambulatory Visit | Attending: Nurse Practitioner | Admitting: Nurse Practitioner

## 2020-09-04 ENCOUNTER — Other Ambulatory Visit: Payer: Self-pay | Admitting: Nurse Practitioner

## 2020-09-04 DIAGNOSIS — R1310 Dysphagia, unspecified: Secondary | ICD-10-CM | POA: Diagnosis not present

## 2020-09-04 DIAGNOSIS — R634 Abnormal weight loss: Secondary | ICD-10-CM

## 2020-09-04 DIAGNOSIS — K228 Other specified diseases of esophagus: Secondary | ICD-10-CM | POA: Diagnosis not present

## 2020-09-04 DIAGNOSIS — K449 Diaphragmatic hernia without obstruction or gangrene: Secondary | ICD-10-CM | POA: Diagnosis not present

## 2020-09-05 ENCOUNTER — Encounter: Payer: Self-pay | Admitting: Gastroenterology

## 2020-09-05 ENCOUNTER — Other Ambulatory Visit: Payer: Self-pay

## 2020-09-05 ENCOUNTER — Ambulatory Visit (AMBULATORY_SURGERY_CENTER): Payer: Federal, State, Local not specified - PPO | Admitting: Gastroenterology

## 2020-09-05 VITALS — BP 139/79 | HR 78 | Temp 96.2°F | Resp 14 | Ht 65.0 in | Wt 156.0 lb

## 2020-09-05 DIAGNOSIS — K449 Diaphragmatic hernia without obstruction or gangrene: Secondary | ICD-10-CM

## 2020-09-05 DIAGNOSIS — R1310 Dysphagia, unspecified: Secondary | ICD-10-CM | POA: Diagnosis not present

## 2020-09-05 DIAGNOSIS — R634 Abnormal weight loss: Secondary | ICD-10-CM

## 2020-09-05 DIAGNOSIS — K317 Polyp of stomach and duodenum: Secondary | ICD-10-CM | POA: Diagnosis not present

## 2020-09-05 MED ORDER — SODIUM CHLORIDE 0.9 % IV SOLN: 500.0000 mL | Freq: Once | INTRAVENOUS | Status: DC

## 2020-09-05 NOTE — Op Note (Signed)
[facility] Patient Name: Darrell Hoffman Procedure Date: 09/05/2020 8:38 AM MRN: [id number] Endoscopist: Ladene Artist , MD Age: 69 Referring MD:  Date of Birth: 31-Jul-1951 Gender: Male Account #: [id number] Procedure:                Upper GI endoscopy Indications:              Dysphagia, Abnormal CT of the GI tract, Weight loss Medicines:                Monitored Anesthesia Care Procedure:                Pre-Anesthesia Assessment:                           - Prior to the procedure, a History and Physical                            was performed, and patient medications and                            allergies were reviewed. The patient's tolerance of                            previous anesthesia was also reviewed. The risks                            and benefits of the procedure and the sedation                            options and risks were discussed with the patient.                            All questions were answered, and informed consent                            was obtained. Prior Anticoagulants: The patient has                            taken no previous anticoagulant or antiplatelet                            agents. ASA Grade Assessment: III - A patient with                            severe systemic disease. After reviewing the risks                            and benefits, the patient was deemed in                            satisfactory condition to undergo the procedure.                           After obtaining informed consent, the endoscope was  passed under direct vision. Throughout the                            procedure, the patient's blood pressure, pulse, and                            oxygen saturations were monitored continuously. The                            Endoscope was introduced through the mouth, and                            advanced to the second part of duodenum. The upper                            GI  endoscopy was accomplished without difficulty.                            The patient tolerated the procedure well. Scope In: Scope Out: Findings:                 The examined esophagus was normal. Z-line at 30 cm.                           A very large hiatal hernia was present.                           A few 3 to 5 mm sessile polyps with no bleeding and                            no stigmata of recent bleeding were found in the                            gastric fundus and in the gastric body. Biopsies                            were taken with a cold forceps for histology.                           The exam of the stomach was otherwise normal.                           The duodenal bulb and second portion of the                            duodenum were normal. Complications:            No immediate complications. Estimated Blood Loss:     Estimated blood loss was minimal. Impression:               - Normal esophagus.                           - Very large hiatal hernia.                           -  A few gastric polyps. Biopsied.                           - Normal duodenal bulb and second portion of the                            duodenum. Recommendation:           - Patient has a contact number available for                            emergencies. The signs and symptoms of potential                            delayed complications were discussed with the                            patient. Return to normal activities tomorrow.                            Written discharge instructions were provided to the                            patient.                           - Resume previous diet.                           - Antireflux measures.                           - Continue present medications.                           - Await pathology results.                           - Refer to a surgeon at appointment to be scheduled. Ladene Artist, MD 09/05/2020 9:00:38 AM This report has  been signed electronically.

## 2020-09-05 NOTE — Progress Notes (Signed)
Called to room to assist during endoscopic procedure.  Patient ID and intended procedure confirmed with present staff. Received instructions for my participation in the procedure from the performing physician.  

## 2020-09-05 NOTE — Patient Instructions (Signed)
HANDOUTS PROVIDED ON: HIATAL HERNIA  The biopsies taken today have been sent for pathology.  The results can take 1-3 weeks to receive.    You may resume your previous diet and medication schedule.  Thank you for allowing [place] to care for you today!!!   YOU HAD AN ENDOSCOPIC PROCEDURE TODAY AT [facility]:   Refer to the procedure report that was given to you for any specific questions about what was found during the examination.  If the procedure report does not answer your questions, please call your gastroenterologist to clarify.  If you requested that your care partner not be given the details of your procedure findings, then the procedure report has been included in a sealed envelope for you to review at your convenience later.  YOU SHOULD EXPECT: Some feelings of bloating in the abdomen. Passage of more gas than usual.  Walking can help get rid of the air that was put into your GI tract during the procedure and reduce the bloating.   Please Note:  You might notice some irritation and congestion in your nose or some drainage.  This is from the oxygen used during your procedure.  There is no need for concern and it should clear up in a day or so.  SYMPTOMS TO REPORT IMMEDIATELY:   Following upper endoscopy (EGD)  Vomiting of blood or coffee ground material  New chest pain or pain under the shoulder blades  Painful or persistently difficult swallowing  New shortness of breath  Fever of 100F or higher  Black, tarry-looking stools  For urgent or emergent issues, a gastroenterologist can be reached at any hour by calling [phone]. Do not use MyChart messaging for urgent concerns.    DIET:  We do recommend a small meal at first, but then you may proceed to your regular diet.  Drink plenty of fluids but you should avoid alcoholic beverages for 24 hours.  ACTIVITY:  You should plan to take it easy for the rest of today and you should NOT DRIVE or use heavy machinery  until tomorrow (because of the sedation medicines used during the test).    FOLLOW UP: Our staff will call the number listed on your records 48-72 hours following your procedure to check on you and address any questions or concerns that you may have regarding the information given to you following your procedure. If we do not reach you, we will leave a message.  We will attempt to reach you two times.  During this call, we will ask if you have developed any symptoms of COVID 19. If you develop any symptoms (ie: fever, flu-like symptoms, shortness of breath, cough etc.) before then, please call [phone].  If you test positive for Covid 19 in the 2 weeks post procedure, please call and report this information to [place].    If any biopsies were taken you will be contacted by phone or by letter within the next 1-3 weeks.  Please call us at [phone] if you have not heard about the biopsies in 3 weeks.    SIGNATURES/CONFIDENTIALITY: You and/or your care partner have signed paperwork which will be entered into your electronic medical record.  These signatures attest to the fact that that the information above on your After Visit Summary has been reviewed and is understood.  Full responsibility of the confidentiality of this discharge information lies with you and/or your care-partner.

## 2020-09-05 NOTE — Progress Notes (Signed)
VS by SB  Pt's states no medical or surgical changes since previsit or office visit.  

## 2020-09-05 NOTE — Progress Notes (Signed)
PT taken to PACU. Monitors in place. VSS. Report given to RN. 

## 2020-09-06 ENCOUNTER — Telehealth: Payer: Self-pay

## 2020-09-06 NOTE — Telephone Encounter (Signed)
Faxed records to [facility] and waiting on appt date and time.

## 2020-09-06 NOTE — Telephone Encounter (Signed)
-----   Message from Ladene Artist, MD sent at 09/05/2020  9:06 AM EDT ----- Please refer to Greer Pickerel, MD [facility] for consideration of hiatal hernia repair. Pt underwent EGD today. Previously had a UGI series and a CT.

## 2020-09-07 ENCOUNTER — Telehealth: Payer: Self-pay

## 2020-09-07 NOTE — Telephone Encounter (Signed)
  Follow up Call-  Call back number 09/05/2020 07/08/2019  Post procedure Call Back phone  # [phone]  Permission to leave phone message Yes Yes  Some recent data might be hidden     Patient questions:  Do you have a fever, pain , or abdominal swelling? No. Pain Score  0 *  Have you tolerated food without any problems? Yes.    Have you been able to return to your normal activities? Yes.    Do you have any questions about your discharge instructions: Diet   No. Medications  No. Follow up visit  No.  Do you have questions or concerns about your Care? No.  Actions: * If pain score is 4 or above: No action needed, pain <4.  1. Have you developed a fever since your procedure? no  2.   Have you had an respiratory symptoms (SOB or cough) since your procedure? no  3.   Have you tested positive for COVID 19 since your procedure no  4.   Have you had any family members/close contacts diagnosed with the COVID 19 since your procedure?  no   If yes to any of these questions please route to Joylene John, RN and Joella Prince, RN

## 2020-09-07 NOTE — Telephone Encounter (Signed)
LVM

## 2020-09-12 NOTE — Telephone Encounter (Signed)
Spoke with [place]acon at [employer] and she states she will look his chart up in Epic and call patient directly to schedule appointment for consult.

## 2020-09-14 ENCOUNTER — Encounter: Payer: Self-pay | Admitting: Gastroenterology

## 2020-09-15 NOTE — Telephone Encounter (Signed)
Patient is scheduled on October 18th at 9:40am.

## 2020-10-02 ENCOUNTER — Ambulatory Visit: Payer: Self-pay | Admitting: General Surgery

## 2020-10-02 DIAGNOSIS — K449 Diaphragmatic hernia without obstruction or gangrene: Secondary | ICD-10-CM | POA: Diagnosis not present

## 2020-10-02 NOTE — H&P (Signed)
History of Present Illness Ralene Ok MD; 10/02/2020 9:51 AM) The patient is a 69 year old male who presents with a hiatal hernia. Referred by: Dr. Fuller Plan Chief Complaint: Hiatal hernia  Patient is a 69 year old male, who comes in secondary to dysphagia and appetite suppression. Patient states that over the last year since January he has lost approximately 50 pounds. Patient states she continues to lose weight. Patient states he had some dysphagia early on in the year. He states she's had some decreased appetite since January. He states that he started to have some less dysphagia recently. He states that he has had no issues with meats and liquids at this point. He states he does have reflux. He states that there for approximately 20 years. Patient is concerned about his weight loss.  Patient underwent endoscopy which revealed a large type IV hiatal hernia-that is including part of his transverse colon- as well as an upper GI and CT scan. I did review all these personally. Patient had a albumin level in June of 4.4.  Patient's had no previous abdominal surgery.    Past Surgical History [facility] Teressa Senter, [place]; 10/02/2020 9:22 AM) Knee Surgery  Bilateral. Vasectomy   Allergies (Chanel Teressa Senter, CMA; 10/02/2020 9:22 AM) No Known Drug Allergies  [10/02/2020]: Allergies Reconciled   Medication History (Chanel Teressa Senter, CMA; 10/02/2020 9:22 AM) Omeprazole (20MG  Capsule DR, Oral) Active. Aspirin Low Dose (81MG  Tablet DR, Oral) Active. Vitamin B12 (Oral) Specific strength unknown - Active. Medications Reconciled  Social History (Chanel Teressa Senter, CMA; 10/02/2020 9:22 AM) Alcohol use  Occasional alcohol use. Illicit drug use  Uses daily. No caffeine use  Tobacco use  Never smoker.  Family History (Tur[place], [place]; 10/02/2020 9:22 AM) Breast Cancer  Mother. Diabetes Mellitus  Sister. Hypertension  Sister.  Other Problems (Chanel Teressa Senter, CMA; 10/02/2020 9:22 AM) Asthma   Gastroesophageal Reflux Disease     Review of Systems Ralene Ok MD; 10/02/2020 9:49 AM) General Present- Appetite Loss, Fatigue and Weight Loss. Not Present- Chills, Fever, Night Sweats and Weight Gain. Skin Not Present- Change in Wart/Mole, Dryness, Hives, Jaundice, New Lesions, Non-Healing Wounds, Rash and Ulcer. HEENT Present- Seasonal Allergies. Not Present- Earache, Hearing Loss, Hoarseness, Nose Bleed, Oral Ulcers, Ringing in the Ears, Sinus Pain, Sore Throat, Visual Disturbances, Wears glasses/contact lenses and Yellow Eyes. Respiratory Present- Snoring. Not Present- Bloody sputum, Chronic Cough, Difficulty Breathing and Wheezing. Breast Not Present- Breast Mass, Breast Pain, Nipple Discharge and Skin Changes. Cardiovascular Not Present- Chest Pain, Difficulty Breathing Lying Down, Leg Cramps, Palpitations, Rapid Heart Rate, Shortness of Breath and Swelling of Extremities. Gastrointestinal Present- Change in Bowel Habits, Constipation, Difficulty Swallowing and Gets full quickly at meals. Not Present- Abdominal Pain, Bloating, Bloody Stool, Chronic diarrhea, Excessive gas, Hemorrhoids, Indigestion, Nausea, Rectal Pain and Vomiting. Male Genitourinary Not Present- Blood in Urine, Change in Urinary Stream, Frequency, Impotence, Nocturia, Painful Urination, Urgency and Urine Leakage. Musculoskeletal Present- Joint Pain and Muscle Weakness. Not Present- Back Pain, Joint Stiffness, Muscle Pain and Swelling of Extremities. Neurological Present- Tingling. Not Present- Decreased Memory, Fainting, Headaches, Numbness, Seizures, Tremor, Trouble walking and Weakness. Psychiatric Not Present- Anxiety, Bipolar, Change in Sleep Pattern, Depression, Fearful and Frequent crying. Endocrine Present- Cold Intolerance. Not Present- Excessive Hunger, Hair Changes, Heat Intolerance, Hot flashes and New Diabetes. Hematology Not Present- Blood Thinners, Easy Bruising, Excessive bleeding, Gland problems,  HIV and Persistent Infections. All other systems negative  Vitals (Chanel Nolan CMA; 10/02/2020 9:23 AM) 10/02/2020 9:22 AM Weight: 153.13 lb Height: 66in Body Surface Area:  1.79 m Body Mass Index: 24.71 kg/m  Temp.: 93.73F  Pulse: 107 (Regular)  BP: 136/78(Sitting, Left Arm, Standard)       Physical Exam Ralene Ok MD; 10/02/2020 9:51 AM) The physical exam findings are as follows: Note: Constitutional: No acute distress, conversant, appears stated age  Eyes: Anicteric sclerae, moist conjunctiva, no lid lag  Neck: No thyromegaly, trachea midline, no cervical lymphadenopathy  Lungs: Clear to auscultation biilaterally, normal respiratory effot  Cardiovascular: regular rate & rhythm, no murmurs, no peripheal edema, pedal pulses 2+  GI: Soft, no masses or hepatosplenomegaly, non-tender to palpation  MSK: Normal gait, no clubbing cyanosis, edema  Skin: No rashes, palpation reveals normal skin turgor  Psychiatric: Appropriate judgment and insight, oriented to person, place, and time    Assessment & Plan Ralene Ok MD; 10/02/2020 9:53 AM) HIATAL HERNIA (K44.9) Impression: Patient is a 70 year old male with a with a large type IV hiatal hernia. Patient has had issues with weight loss, dysphagia. Patient has undergone a thorough workup aside from manometry testing. I believe this will likely be thrown off likely due to the size of this hernia.  1. Patient will be a candidate for robotic had a hernia repair with mesh and toupee fundoplication. 2. Discussed with patient the risks and benefits of the procedure to include but not limited to: Infection, bleeding, damage to structures, possible pneumothorax, possible recurrence. The patient voiced understanding and wishes to proceed.  3. Discussed with the patient to increase his protein and caloric intake. We'll obtain CMP as well as preoperative levels.  I reviewed the patient's external notes from the  referring physicians as well as consulting physician team. Each of the radiologic studies and lab studies were independently reviewed and interpreted. I discussed the results of the above studies and how they relate to the patient's surgical problems.

## 2020-10-19 ENCOUNTER — Telehealth: Payer: Self-pay | Admitting: Gastroenterology

## 2020-10-19 NOTE — Telephone Encounter (Signed)
Symptoms are from a large type IV hiatal hernia referred to Dr. Ralene Ok for surgical mgmt.  Small frequent meals with soft, low fat foods Ensure, Boost or similar nutrition supplements TID between meals if unable to eat enough to maintain weight  Increase omeprazole to 40 mg po bid, #60, 2 refills Begin metoclopramide 5 mg po 30 min ac and hs, #120, 1 refill

## 2020-10-19 NOTE — Telephone Encounter (Signed)
Patient states he continues to have loss of appetite, weight loss and trouble swallowing since September. Patient states he drinks protein shakes every day and still cannot gain weight. He reports losing 6-10 more pounds since September. He has missed several days of work and requests a work note for those dates. Informed patient that he has not contacted [place] since September to inform us that he continues to have problems. Patient states we should know he is still having issues because nothing has been resolved and nothing was found from the Endoscopy. Informed patient that we have referred him to a surgeon due to his large hiatal hernia which can cause the symptoms he is having. Patient states his surgery is not scheduled until the end of this month and he needs to be out of work until the surgery because he cannot work feeling this bad. I informed patient he can contact [employer] to see if they will fill out FMLA for this diagnosis until his surgery date or see if they can schedule his surgery sooner. Patient states he already called and they will only give post op FMLA. Also, patient states they do not have any sooner dates available. Informed patient that surgery is the next step and unfortunately Dr. Fuller Plan more then likely will not give a work excuse for the rest of the month until surgery. Patient insists and I told patient I will send the message to Dr. Fuller Plan.

## 2020-10-23 MED ORDER — OMEPRAZOLE 40 MG PO CPDR: 40.0000 mg | DELAYED_RELEASE_CAPSULE | Freq: Two times a day (BID) | ORAL | 2 refills | Status: DC

## 2020-10-23 MED ORDER — METOCLOPRAMIDE HCL 5 MG PO TABS: 5.0000 mg | ORAL_TABLET | Freq: Three times a day (TID) | ORAL | 1 refills | Status: DC

## 2020-10-23 NOTE — Telephone Encounter (Signed)
Spoke with patient and explained Dr. Lynne Leader recommendations. Prescriptions for omeprazole and metoclopramide sent to patient's pharmacy. Patient was wondering what he can take for constipation. Informed patient he take a laxative such as Miralax OTC or a stool softener over the counter. Patient verbalized understanding of all the information given by phone.

## 2020-11-06 NOTE — Pre-Procedure Instructions (Signed)
[id number] [address] [place] [address] Phone: [phone] Fax: [phone]      Your procedure is scheduled on Tuesday, November 30th at 11:30a.m.  Report to [facility] Main Entrance "A" at 9:30 A.M., and check in at the Admitting office.  Call this number if you have problems the morning of surgery:  [phone]  Call [phone] if you have any questions prior to your surgery date Monday-Friday 8am-4pm    Remember:  Do not eat after midnight the night before your surgery  You may drink clear liquids until 8:30 the morning of your surgery.   Clear liquids allowed are: Water, Non-Citrus Juices (without pulp), Carbonated Beverages, Clear Tea, Black Coffee Only, and Gatorade.  Patient Instructions  . The night before surgery:  o No food after midnight. ONLY clear liquids after midnight  .  Marland Kitchen The day of surgery (if you do NOT have diabetes):  o Drink ONE (1) Pre-Surgery Clear Ensure by 8:30 am the morning of surgery.   o This drink was given to you during your hospital  pre-op appointment visit. o Nothing else to drink after completing the  Pre-Surgery Clear Ensure.         If you have questions, please contact your surgeon's office.     Take these medicines the morning of surgery with A SIP OF WATER  omeprazole (PRILOSEC)     Fluticasone-Salmeterol (WIXELA INHUB) metoCLOPramide (REGLAN) -as needed fluticasone (FLONASE)-as needed albuterol (PROVENTIL HFA) -as needed  As of today, STOP taking any Aspirin (unless otherwise instructed by your surgeon) Aleve, Naproxen, Ibuprofen, Motrin, Advil, Goody's, BC's, all herbal medications, fish oil, and all vitamins.                     Do not wear jewelry.            Do not wear lotions, powders, colognes, or deodorant.            Men may shave face and neck.            Do not bring valuables to the hospital.             [facility] is not responsible for any belongings or valuables.  Do NOT Smoke (Tobacco/Vaping) or drink Alcohol 24 hours prior to your procedure If you use a CPAP at night, you may bring all equipment for your overnight stay.   Contacts, glasses, dentures or bridgework may not be worn into surgery.      For patients admitted to the hospital, discharge time will be determined by your treatment team.   Patients discharged the day of surgery will not be allowed to drive home, and someone needs to stay with them for 24 hours.    Special instructions:   [facility]- Preparing For Surgery  Before surgery, you can play an important role. Because skin is not sterile, your skin needs to be as free of germs as possible. You can reduce the number of germs on your skin by washing with CHG (chlorahexidine gluconate) Soap before surgery.  CHG is an antiseptic cleaner which kills germs and bonds with the skin to continue killing germs even after washing.    Oral Hygiene is also important to reduce your risk of infection.  Remember - BRUSH YOUR TEETH THE MORNING OF SURGERY WITH YOUR REGULAR TOOTHPASTE  Please do not use  if you have an allergy to CHG or antibacterial soaps. If your skin becomes reddened/irritated stop using the CHG.  Do not shave (including legs and underarms) for at least 48 hours prior to first CHG shower. It is OK to shave your face.  Please follow these instructions carefully.   1. Shower the NIGHT BEFORE SURGERY and the MORNING OF SURGERY with CHG Soap.   2. If you chose to wash your hair, wash your hair first as usual with your normal shampoo.  3. After you shampoo, rinse your hair and body thoroughly to remove the shampoo.  4. Use CHG as you would any other liquid soap. You can apply CHG directly to the skin and wash gently with a scrungie or a clean washcloth.   5. Apply the CHG Soap to your body ONLY FROM THE NECK DOWN.  Do not use on open wounds or open sores. Avoid  contact with your eyes, ears, mouth and genitals (private parts). Wash Face and genitals (private parts)  with your normal soap.   6. Wash thoroughly, paying special attention to the area where your surgery will be performed.  7. Thoroughly rinse your body with warm water from the neck down.  8. DO NOT shower/wash with your normal soap after using and rinsing off the CHG Soap.  9. Pat yourself dry with a CLEAN TOWEL.  10. Wear CLEAN PAJAMAS to bed the night before surgery  11. Place CLEAN SHEETS on your bed the night of your first shower and DO NOT SLEEP WITH PETS.   Day of Surgery: Wear Clean/Comfortable clothing the morning of surgery Do not apply any deodorants/lotions.   Remember to brush your teeth WITH YOUR REGULAR TOOTHPASTE.   Please read over the following fact sheets that you were given.

## 2020-11-07 ENCOUNTER — Encounter (HOSPITAL_COMMUNITY): Payer: Self-pay | Admitting: Vascular Surgery

## 2020-11-07 ENCOUNTER — Encounter (HOSPITAL_COMMUNITY)
Admission: RE | Admit: 2020-11-07 | Discharge: 2020-11-07 | Disposition: A | Discharge status: Home / Self Care | Payer: Federal, State, Local not specified - PPO | Source: Ambulatory Visit | Attending: General Surgery | Admitting: General Surgery

## 2020-11-07 ENCOUNTER — Other Ambulatory Visit: Payer: Self-pay

## 2020-11-07 ENCOUNTER — Encounter (HOSPITAL_COMMUNITY): Payer: Self-pay

## 2020-11-07 DIAGNOSIS — I452 Bifascicular block: Secondary | ICD-10-CM | POA: Diagnosis not present

## 2020-11-07 DIAGNOSIS — Z01818 Encounter for other preprocedural examination: Secondary | ICD-10-CM | POA: Diagnosis not present

## 2020-11-07 HISTORY — DX: Myoneural disorder, unspecified: G70.9

## 2020-11-07 HISTORY — DX: Personal history of other diseases of the digestive system: Z87.19

## 2020-11-07 HISTORY — DX: Unspecified asthma, uncomplicated: J45.909

## 2020-11-07 LAB — CBC
HCT: 38 % — ABNORMAL LOW (ref 39.0–52.0)
Hemoglobin: 11.9 g/dL — ABNORMAL LOW (ref 13.0–17.0)
MCH: 26.6 pg (ref 26.0–34.0)
MCHC: 31.3 g/dL (ref 30.0–36.0)
MCV: 84.8 fL (ref 80.0–100.0)
Platelets: 238 10*3/uL (ref 150–400)
RBC: 4.48 MIL/uL (ref 4.22–5.81)
RDW: 13.8 % (ref 11.5–15.5)
WBC: 9.4 10*3/uL (ref 4.0–10.5)
nRBC: 0 % (ref 0.0–0.2)

## 2020-11-07 LAB — COMPREHENSIVE METABOLIC PANEL
ALT: 16 U/L (ref 0–44)
AST: 17 U/L (ref 15–41)
Albumin: 3.8 g/dL (ref 3.5–5.0)
Alkaline Phosphatase: 36 U/L — ABNORMAL LOW (ref 38–126)
Anion gap: 10 (ref 5–15)
BUN: 12 mg/dL (ref 8–23)
CO2: 24 mmol/L (ref 22–32)
Calcium: 9.4 mg/dL (ref 8.9–10.3)
Chloride: 105 mmol/L (ref 98–111)
Creatinine, Ser: 1.04 mg/dL (ref 0.61–1.24)
GFR, Estimated: >60 mL/min (ref >60)
Glucose, Bld: 99 mg/dL (ref 70–99)
Potassium: 3.7 mmol/L (ref 3.5–5.1)
Sodium: 139 mmol/L (ref 135–145)
Total Bilirubin: 0.6 mg/dL (ref 0.3–1.2)
Total Protein: 6.6 g/dL (ref 6.5–8.1)

## 2020-11-07 NOTE — Progress Notes (Addendum)
PCP - Cathlean Cower Cardiologist - denies  PPM/ICD - denies   Chest x-ray - 06/07/20 EKG - 11/07/2020(abnormal) Stress Test - over 15 years ago, unknown MD or location ECHO - denies Cardiac Cath - denies  Sleep Study - home sleep study over 5 years ago, unknown location so unable to obtain records, results were inconclusive and was never diagnosed with OSA or given a CPAP. Also lives alone so unable to know if he stops breathing in his sleep or snores loudly.    Patient instructed to hold all Aspirin, NSAID's, herbal medications, fish oil and vitamins 7 days prior to surgery.   ERAS Protcol -yes PRE-SURGERY Ensure or G2- ensure and instructions given  COVID TEST- 11/10/2020 1000   Anesthesia review: yes, abnormal EKG, right BBB, and left anterior fascicular block  Patient denies shortness of breath, fever, cough and chest pain at PAT appointment   All instructions explained to the patient, with a verbal understanding of the material. Patient agrees to go over the instructions while at home for a better understanding. Patient also instructed to self quarantine after being tested for COVID-19. The opportunity to ask questions was provided.

## 2020-11-08 ENCOUNTER — Telehealth: Payer: Self-pay

## 2020-11-08 NOTE — Telephone Encounter (Signed)
Called and informed the requesting office of Darrell Hoffman surgery that the patient's upcoming procedure would need to be rescheduled due to him needing a cardiac clearance appointment and is new to our practice. Informed [name] that patient is scheudle for a 11/21/20 appointment. She informed me that the procedure was canceled due to a workup would be need to be performed by our office and it would give more time for that. She thank me for calling.

## 2020-11-08 NOTE — Telephone Encounter (Signed)
    [facility] Pre-operative Risk Assessment    HEARTCARE STAFF: - Please ensure there is not already an duplicate clearance open for this procedure. - Under Visit Info/Reason for Call, type in Other and utilize the format Clearance MM/DD/YY or Clearance TBD. Do not use dashes or single digits. - If request is for dental extraction, please clarify the # of teeth to be extracted.  Request for surgical clearance:  1. What type of surgery is being performed? HIATAL HERNIA  2. When is this surgery scheduled? 11/14/20   3. What type of clearance is required (medical clearance vs. Pharmacy clearance to hold med vs. Both)? BOTH  4. Are there any medications that need to be held prior to surgery and how long? ASA  5. Practice name and name of physician performing surgery? [facility]; DR. Anne Hahn RAMIREZ   6. What is the office phone number? [phone]   7.   What is the office fax number? [phone]  8.   Anesthesia type (None, local, MAC, general) ? GENERAL    Jacinta Shoe 11/08/2020, 3:54 PM  _________________________________________________________________   (provider comments below)

## 2020-11-08 NOTE — Telephone Encounter (Signed)
Called patient and explained why I was calling. Patient is not scheduled to see Dr. Gasper Sells on 11/21/20 at 8:20 AM. Patient verbalized understanding and all (if any) questions were answered. I will pass this along to Dr. Gasper Sells so that he is aware.

## 2020-11-08 NOTE — Telephone Encounter (Signed)
   Primary Cardiologist: No primary care provider on file.  Chart reviewed as part of pre-operative protocol coverage. Patient has never been seen by our office. Therefore, he will require a follow-up visit in order to better assess preoperative cardiovascular risk. Procedure will likely need to be rescheduled as I do not think we will be able to get him an appointment before 11/14/2020.  Pre-op covering staff: - Please schedule appointment and call patient to inform them. If patient already had an upcoming appointment within acceptable timeframe, please add "pre-op clearance" to the appointment notes so provider is aware. - Please contact requesting surgeon's office via preferred method (i.e, phone, fax) to inform them of need for appointment prior to surgery.  If applicable, this message will also be routed to pharmacy pool and/or primary cardiologist for input on holding anticoagulant/antiplatelet agent as requested below so that this information is available to the clearing provider at time of patient's appointment.   Darreld Mclean, PA-C  11/08/2020, 4:10 PM

## 2020-11-08 NOTE — Progress Notes (Signed)
Anesthesia Chart Review:  Case: [id number] Date/Time: 11/14/20 1115   Procedure: XI ROBOTIC ASSISTED HIATAL HERNIA REPAIR and FUNDOPLICATION WITH MESH (N/A )   Anesthesia type: General   Pre-op diagnosis: hiatal hernia   Location: [facility] OR ROOM 10 / [place] OR   Surgeons: Ralene Ok, MD      DISCUSSION: Patient is a 69 year old male scheduled for the above procedure.  He was referred to general surgery by GI Dr. Fuller Plan for weight loss (~ 50 lbs since last January, decreased appetite, reflux, and some dysphagia with EGD showing large type IV hiatal hernia including part of his transverse colon.   History includes never tobacco smoker, HLD, HTN, asthma, hiatal hernia, GERD, anemia, CKD stage III, ED, prior alcohol abuse ("not currently" documented). Daily marijuana.   Preoperative labs show improvement in his Creatinine to 1.03 (previously 2.28 on 07/30/20, 1. 06/07/20).  EKG shows new RBBB/LAFB/bifascicular block since 2018. Discussed with anesthesiologist Tamela Gammon, MD. Given EKG changes with bifascicular block would recommend preoperative cardiology evaluation. I have notified [place].    Preoperative COVID-19 test is scheduled for 11/10/2020.   VS: BP 122/84   Pulse 93   Temp 36.6 C (Oral)   Resp 18   Ht 5\' 6"  (1.676 m)   Wt 65.5 kg   SpO2 100%   BMI 23.32 kg/m    PROVIDERS: Biagio Borg, MD his PCP. Last visit 08/23/20.  Lucio Edward, MD is GI   LABS: Labs reviewed: Acceptable for surgery. A1c 6.3% 06/07/20. (all labs ordered are listed, but only abnormal results are displayed)  Labs Reviewed  COMPREHENSIVE METABOLIC PANEL - Abnormal; Notable for the following components:      Result Value   Alkaline Phosphatase 36 (*)    All other components within normal limits  CBC - Abnormal; Notable for the following components:   Hemoglobin 11.9 (*)    HCT 38.0 (*)    All other components within normal limits    IMAGES/OTHER: EGD 09/05/20: Impression: - Normal  esophagus. - Very large hiatal hernia. - A few gastric polyps. Biopsied. - Normal duodenal bulb and second portion of the duodenum.  Upper GI Series 09/04/20: IMPRESSION: - Very large hiatal hernia containing the entire stomach and a significant portion of the transverse colon - Mild decreased esophageal motility without stricture - No acute abnormality of the stomach or duodenum.  CXR 06/07/20: FINDINGS: The heart size and mediastinal contours are within normal limits. Large hiatal hernia, similar in size and appearance compared to prior. Right basilar compressive atelectasis. Lungs appear otherwise clear. No pleural effusion or pneumothorax. The visualized skeletal structures are unremarkable. IMPRESSION: 1. No active cardiopulmonary disease. 2. Large hiatal hernia, similar in size and appearance compared to prior.  He did not qualify for nocturnal oxygen per Medicare guidelines per 11/03/17 overnight pulse oximetry.   EKG: Sinus rhythm with Fusion complexes Right bundle branch block Left anterior fascicular block ** Bifascicular block ** New since previous tracing septal q waves Abnormal ECG Confirmed by Virl Axe [phone]) on 11/07/2020 9:05:28 PM (Previous EKG on 03/27/17 showed SR, PACs, diffuse non-specific T wave abnormality)   CV: Reported a stress test > 15 years ago.   Past Medical History:  Diagnosis Date  . ALCOHOL ABUSE, HX OF 10/03/2007  . ALLERGIC RHINITIS 10/03/2007  . ANEMIA-B12 DEFICIENCY 12/04/2009  . ANEMIA-IRON DEFICIENCY 11/27/2009  . ANEMIA-NOS 10/08/2007  . ASTHMA 10/03/2007  . Asthma   . B12 DEFICIENCY 10/06/2008  . CKD (chronic kidney disease)  stage 3, GFR 30-59 ml/min (HCC) 03/31/2018  . COLONIC POLYPS, HX OF 10/03/2007  . ERECTILE DYSFUNCTION 10/03/2007  . GERD 10/03/2007  . History of hiatal hernia    found on CXT in June 2021  . HYPERLIPIDEMIA 10/03/2007  . HYPERTENSION 10/03/2007  . KNEE PAIN, RIGHT 10/08/2007  . Morbid obesity  ([place]) 10/03/2007  . Neuromuscular disorder ([place])    per pt. never diagnosed  . Substance abuse ([place])    marijuana daily    Past Surgical History:  Procedure Laterality Date  . bilateral hydrocele repair    . COLONOSCOPY  06/06/2016  . POLYPECTOMY  01-03-2010  . s/p left knee arthroscopy  2009  . s/p TKR  03/2009  . VASECTOMY      MEDICATIONS: . albuterol (PROVENTIL HFA) 108 (90 Base) MCG/ACT inhaler  . ASPIRIN LOW DOSE 81 MG EC tablet  . CIALIS 20 MG tablet  . fluticasone (FLONASE) 50 MCG/ACT nasal spray  . Fluticasone-Salmeterol (WIXELA INHUB) 500-50 MCG/DOSE AEPB  . metoCLOPramide (REGLAN) 5 MG tablet  . omeprazole (PRILOSEC) 40 MG capsule  . vitamin B-12 (CYANOCOBALAMIN) 1000 MCG tablet   No current facility-administered medications for this encounter.    Myra Gianotti, PA-C Surgical Short Stay/Anesthesiology [facility] Phone [phone] [facility] Phone [phone] 11/08/2020 1:58 PM

## 2020-11-10 ENCOUNTER — Other Ambulatory Visit (HOSPITAL_COMMUNITY): Payer: Federal, State, Local not specified - PPO

## 2020-11-12 ENCOUNTER — Other Ambulatory Visit: Payer: Self-pay | Admitting: Internal Medicine

## 2020-11-12 NOTE — Telephone Encounter (Signed)
Please refill as per office routine med refill policy (all routine meds refilled for 3 mo or monthly per pt preference up to one year from last visit, then month to month grace period for 3 mo, then further med refills will have to be denied)  

## 2020-11-13 ENCOUNTER — Telehealth: Payer: Self-pay | Admitting: Gastroenterology

## 2020-11-13 NOTE — Telephone Encounter (Signed)
Patient reports continued weight loss.  He is drinking boost or ensure.  His surgery has been delayed due to a cardiac arrhythmia that was discovered on his pre-op screening exam.  He c/o constipation as well.  He reports 12 lb weight loss and is now at 144 lbs.  He is asking what he can eat or drink to put weight back on.  He is advised for the constipation to take Miralax 1-2 times a day.  He is advised to add additional Boost or ensure drinks and can add ice cream to them to boost the calories.  He will call back for additional questions or concerns.

## 2020-11-14 ENCOUNTER — Inpatient Hospital Stay (HOSPITAL_COMMUNITY)
Admission: RE | Admit: 2020-11-14 | Payer: Federal, State, Local not specified - PPO | Source: Home / Self Care | Admitting: General Surgery

## 2020-11-14 ENCOUNTER — Encounter (HOSPITAL_COMMUNITY): Admission: RE | Payer: Self-pay | Source: Home / Self Care

## 2020-11-14 SURGERY — REPAIR, HERNIA, HIATAL, ROBOT-ASSISTED
Anesthesia: General

## 2020-11-15 IMAGING — DX DG CHEST 2V
2 series · 2 of 2 positions shown · non-contrast
Comparison: 03/30/2018

CLINICAL DATA: Unexpected weight loss

EXAM:
CHEST - 2 VIEW

[chest pa]
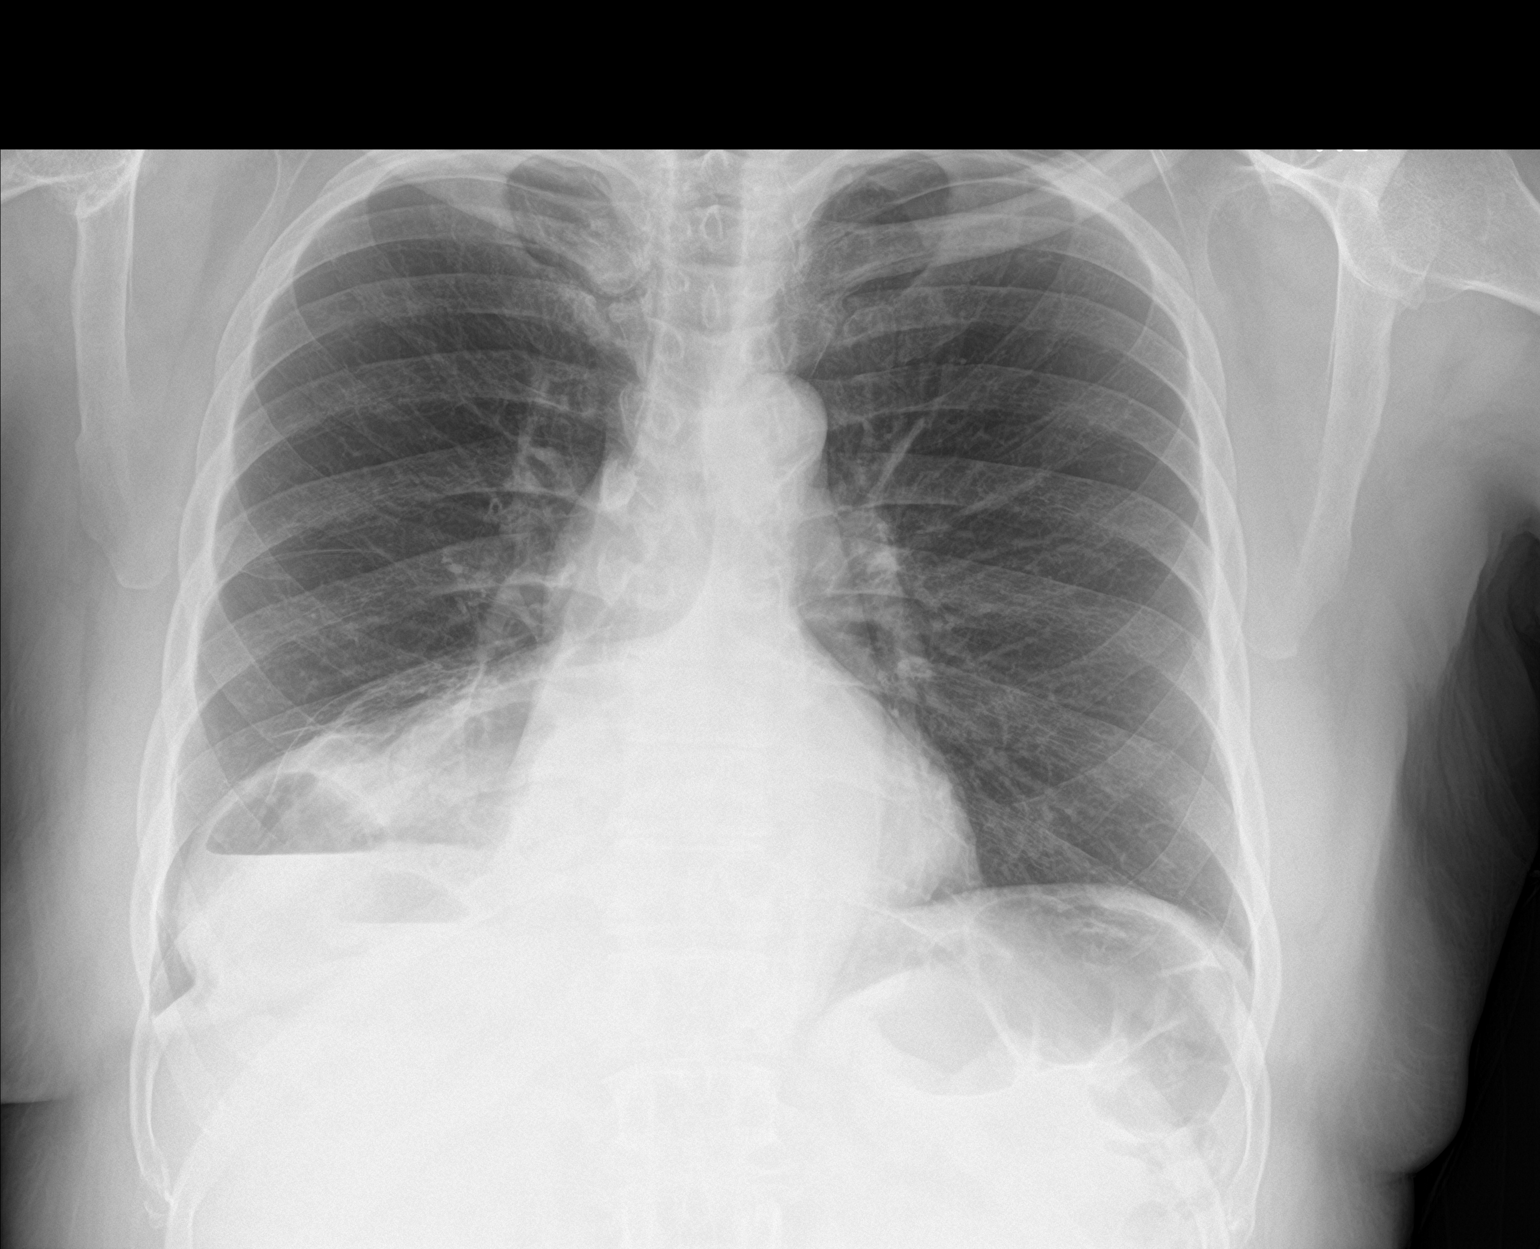

[chest lat]
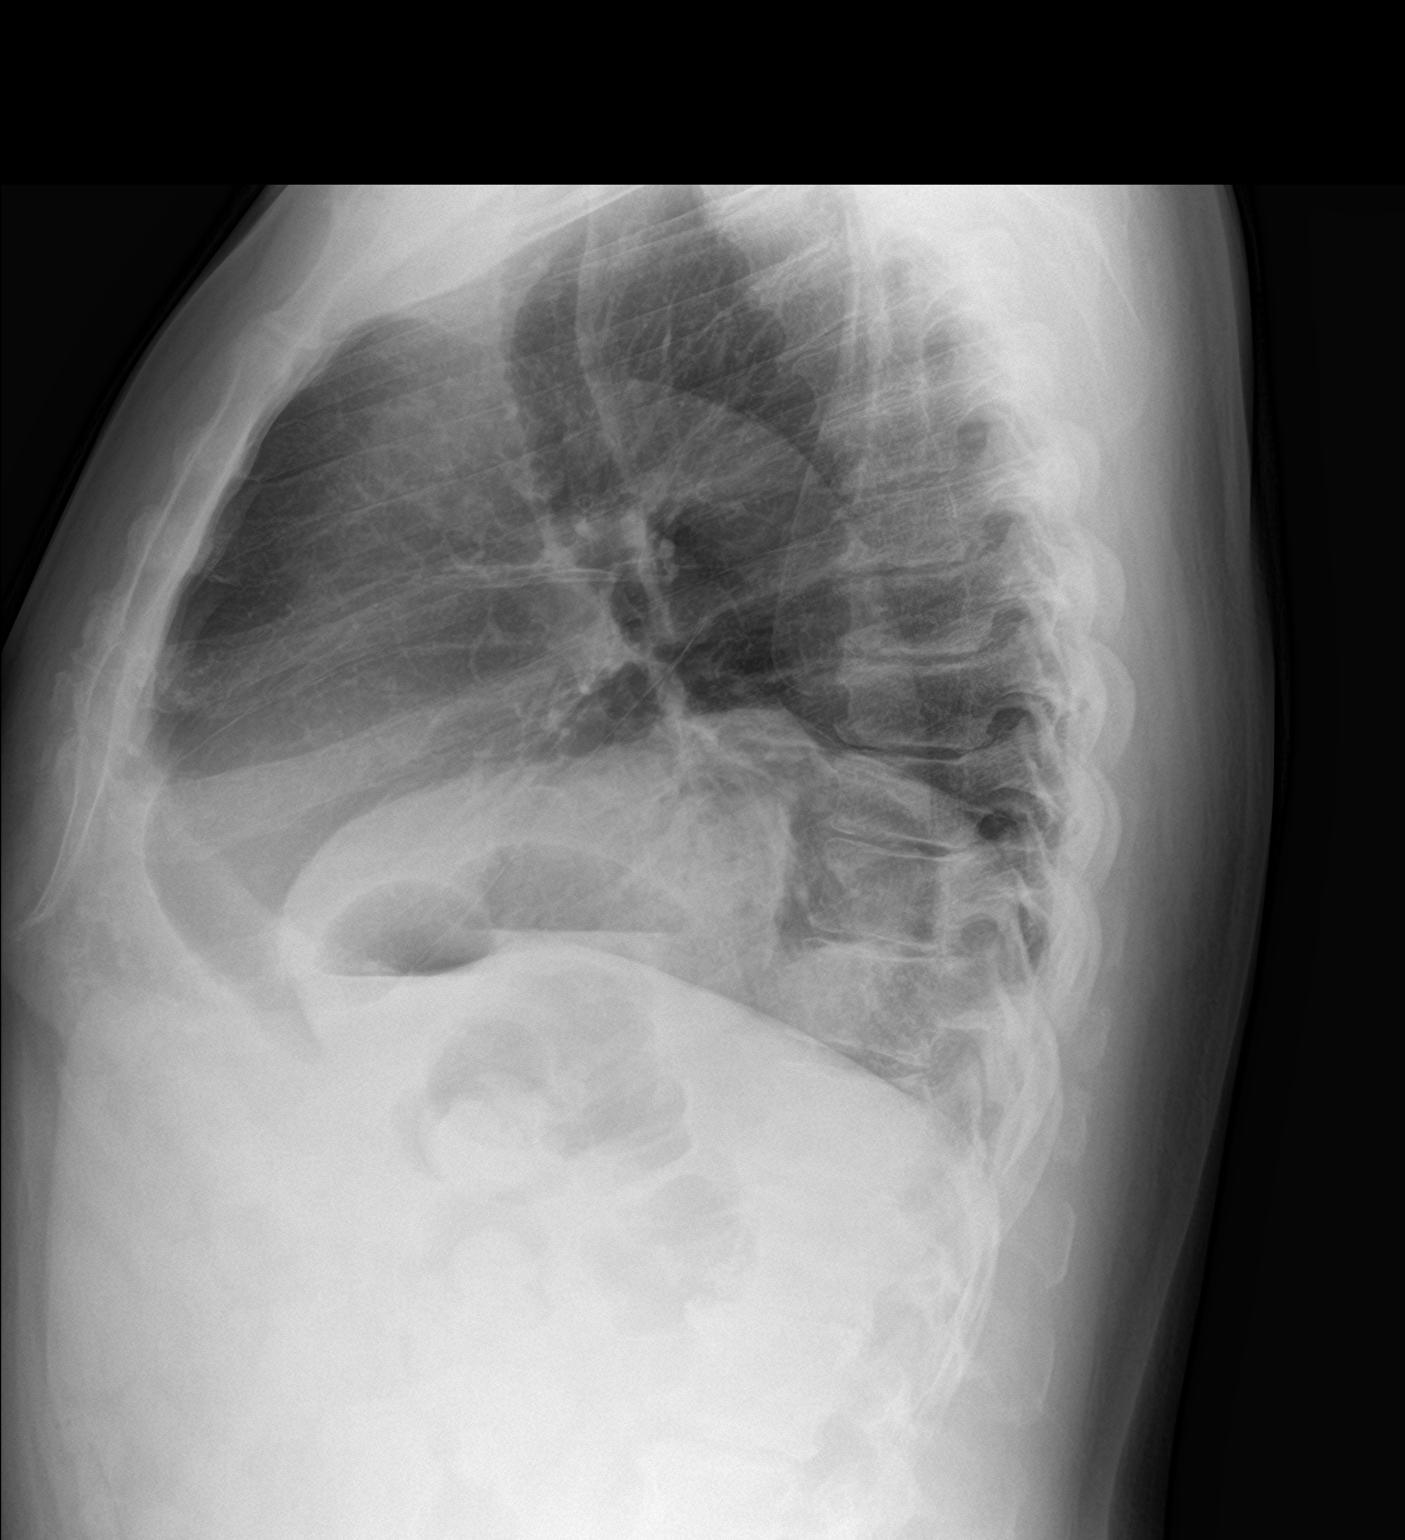

[2 of 2 positions shown; findings below may reference images not displayed]

FINDINGS: The heart size and mediastinal contours are within normal limits.
Large hiatal hernia, similar in size and appearance compared to
prior. Right basilar compressive atelectasis. Lungs appear otherwise
clear. No pleural effusion or pneumothorax. The visualized skeletal
structures are unremarkable.
IMPRESSION: 1. No active cardiopulmonary disease.
2. Large hiatal hernia, similar in size and appearance compared to
prior.

## 2020-11-21 ENCOUNTER — Encounter: Payer: Self-pay | Admitting: Internal Medicine

## 2020-11-21 ENCOUNTER — Ambulatory Visit: Payer: Federal, State, Local not specified - PPO | Admitting: Internal Medicine

## 2020-11-21 ENCOUNTER — Other Ambulatory Visit: Payer: Self-pay

## 2020-11-21 VITALS — BP 114/82 | HR 82 | Ht 65.0 in | Wt 145.8 lb

## 2020-11-21 DIAGNOSIS — Z01810 Encounter for preprocedural cardiovascular examination: Secondary | ICD-10-CM | POA: Diagnosis not present

## 2020-11-21 DIAGNOSIS — E785 Hyperlipidemia, unspecified: Secondary | ICD-10-CM | POA: Diagnosis not present

## 2020-11-21 DIAGNOSIS — I10 Essential (primary) hypertension: Secondary | ICD-10-CM

## 2020-11-21 NOTE — Progress Notes (Signed)
Cardiology Office Note:    Date:  11/21/2020   ID:  Darrell Phoenix., DOB Aug 01, 1951, MRN [id number]  PCP:  Biagio Borg, MD  [place] Cardiologist:  No primary care provider on file.  [facility] Electrophysiologist:  None   CC: Prep for surgery Consulted for the evaluation of preoperative risk assessment at the behest of Biagio Borg, MD  History of Present Illness:    Darrell Wilsey. is a 69 y.o. male with a hx of HTN, HLD, CKD Stage III, Marijuana use for evaluation.  Patient notes that he is feeling OK, but worried about .  Has had no chest pain, chest pressure, chest tightness, chest stinging.    Patient exertion notable for doing things around the house and cleaning without and feels no symptoms.  No shortness of breath, DOE.  No syncope or near syncope.  Notes that he is continually losing weight and decreasing in body mass and strength  Patient reports prior cardiac testing including  stress test 20 years ago and recent ABI's 08/28/20 that were WNL  Ambulatory BP not done.  Past Medical History:  Diagnosis Date  . ALCOHOL ABUSE, HX OF 10/03/2007  . ALLERGIC RHINITIS 10/03/2007  . ANEMIA-B12 DEFICIENCY 12/04/2009  . ANEMIA-IRON DEFICIENCY 11/27/2009  . ANEMIA-NOS 10/08/2007  . ASTHMA 10/03/2007  . Asthma   . B12 DEFICIENCY 10/06/2008  . CKD (chronic kidney disease) stage 3, GFR 30-59 ml/min (HCC) 03/31/2018  . COLONIC POLYPS, HX OF 10/03/2007  . ERECTILE DYSFUNCTION 10/03/2007  . GERD 10/03/2007  . History of hiatal hernia    found on CXT in June 2021  . HYPERLIPIDEMIA 10/03/2007  . HYPERTENSION 10/03/2007  . KNEE PAIN, RIGHT 10/08/2007  . Morbid obesity ([place]) 10/03/2007  . Neuromuscular disorder ([place])    per pt. never diagnosed  . Substance abuse ([place])    marijuana daily    Past Surgical History:  Procedure Laterality Date  . bilateral hydrocele repair    . COLONOSCOPY  06/06/2016  . POLYPECTOMY  01-03-2010  . s/p left knee arthroscopy  2009  .  s/p TKR  03/2009  . VASECTOMY      Current Medications: Current Meds  Medication Sig  . albuterol (PROVENTIL HFA) 108 (90 Base) MCG/ACT inhaler Inhale 2 puffs into the lungs every 4 (four) hours as needed.  . ASPIRIN LOW DOSE 81 MG EC tablet TAKE 1 TABLET(81 MG) BY MOUTH DAILY. SWALLOW WHOLE  . CIALIS 20 MG tablet TAKE 1 TABLET BY MOUTH DAILY AS NEEDED  . fluticasone (FLONASE) 50 MCG/ACT nasal spray Place 2 sprays into both nostrils daily.  . metoCLOPramide (REGLAN) 5 MG tablet Take 1 tablet (5 mg total) by mouth 4 (four) times daily -  before meals and at bedtime.  Marland Kitchen omeprazole (PRILOSEC) 40 MG capsule Take 1 capsule (40 mg total) by mouth in the morning and at bedtime.  . vitamin B-12 (CYANOCOBALAMIN) 1000 MCG tablet Take 1 tablet (1,000 mcg total) by mouth daily.  Grant Ruts INHUB 500-50 MCG/DOSE AEPB INHALE 1 PUFF INTO THE LUNGS TWICE DAILY     Allergies:   Patient has no known allergies.   Social History   Socioeconomic History  . Marital status: Divorced    Spouse name: Not on file  . Number of children: 2  . Years of education: Not on file  . Highest education level: Not on file  Occupational History  . Occupation: Research officer, political party: [employer]  Tobacco Use  . Smoking status: Never Smoker  . Smokeless tobacco: Never Used  Vaping Use  . Vaping Use: Never used  Substance and Sexual Activity  . Alcohol use: Not Currently    Alcohol/week: 0.0 standard drinks  . Drug use: Yes    Types: Marijuana    Comment: marijuana daily  . Sexual activity: Not on file  Other Topics Concern  . Not on file  Social History Narrative  . Not on file   Social Determinants of Health   Financial Resource Strain:   . Difficulty of Paying Living Expenses: Not on file  Food Insecurity:   . Worried About Charity fundraiser in the Last Year: Not on file  . Ran Out of Food in the Last Year: Not on file  Transportation Needs:   . Lack of Transportation (Medical): Not on  file  . Lack of Transportation (Non-Medical): Not on file  Physical Activity:   . Days of Exercise per Week: Not on file  . Minutes of Exercise per Session: Not on file  Stress:   . Feeling of Stress : Not on file  Social Connections:   . Frequency of Communication with Friends and Family: Not on file  . Frequency of Social Gatherings with Friends and Family: Not on file  . Attends Religious Services: Not on file  . Active Member of Clubs or Organizations: Not on file  . Attends Archivist Meetings: Not on file  . Marital Status: Not on file     Family History: The patient's family history includes Alcohol abuse in his paternal uncle; Breast cancer in his mother; Coronary artery disease in an other family member; Diabetes in his cousin and sister; Hypertension in his sister; Lupus in his sister. There is no history of Colon cancer, Esophageal cancer, Rectal cancer, Stomach cancer, or Colon polyps. Father died suddenly in a car wreck at age 4.  ROS:   Please see the history of present illness.    Notes significant weight loss. All other systems reviewed and are negative.  EKGs/Labs/Other Studies Reviewed:    The following studies were reviewed today:  EKG:  11/07/20 SR rate 80, RBBB and LAFG (BiFB)  Recent Labs: 06/07/2020: TSH 3.45 11/07/2020: ALT 16; BUN 12; Creatinine, Ser 1.04; Hemoglobin 11.9; Platelets 238; Potassium 3.7; Sodium 139  Recent Lipid Panel    Component Value Date/Time   CHOL 153 06/07/2020 1644   TRIG 284.0 (H) 06/07/2020 1644   HDL 37.60 (L) 06/07/2020 1644   CHOLHDL 4 06/07/2020 1644   VLDL 56.8 (H) 06/07/2020 1644   LDLCALC 81 09/25/2017 1050   LDLDIRECT 71.0 06/07/2020 1644    Risk Assessment/Calculations:     The 10-year ASCVD risk score Mikey Bussing DC Brooke Bonito., et al., 2013) is: 9.1%   Values used to calculate the score:     Age: 25 years     Sex: Male     Is Non-Hispanic African American: Yes     Diabetic: No     Tobacco smoker: No      Systolic Blood Pressure: 315 mmHg     Is BP treated: No     HDL Cholesterol: 37.6 mg/dL     Total Cholesterol: 153 mg/dL  RCRI 0  Physical Exam:    VS:  BP 114/82   Pulse 82   Ht 5\' 5"  (1.651 m)   Wt 145 lb 12.8 oz (66.1 kg)   SpO2 100%   BMI 24.26 kg/m  Wt Readings from Last 3 Encounters:  11/21/20 145 lb 12.8 oz (66.1 kg)  11/07/20 144 lb 8 oz (65.5 kg)  09/05/20 156 lb (70.8 kg)     GEN: Well nourished, well developed in no acute distress HEENT: Normal NECK: No JVD; No carotid bruits LYMPHATICS: No lymphadenopathy CARDIAC: RRR, no murmurs, rubs, gallops RESPIRATORY:  Clear to auscultation without rales, wheezing or rhonchi  ABDOMEN: Soft, non-tender, non-distended MUSCULOSKELETAL:  No edema; No deformity  SKIN: Warm and dry NEUROLOGIC:  Alert and oriented x 3 PSYCHIATRIC:  Depressed affect   ASSESSMENT:    1. Preoperative cardiovascular examination   2. Essential hypertension   3. Hyperlipidemia, unspecified hyperlipidemia type    PLAN:    In order of problems listed above:  Preoperative Risk Assessment - Hiatal Hernia Surgery [facility], ASA held day before (which should be fine) planned for general anesthesia - The Revised Cardiac Risk Index = 0  which equates to 0.4%: very low risk estimated risk of perioperative myocardial infarction, pulmonary edema, ventricular fibrillation, cardiac arrest, or complete heart block.  - DASI score of 15 associated with 4.64 functional mets - No further cardiac testing is recommended prior to surgery.  - The patient may proceed to surgery at acceptable risk.   - Our service is available as needed in the peri-operative period.    Essential Hypertension - ambulatory blood pressure not done, will start ambulatory BP monitoring; gave education on how to perform ambulatory blood pressure monitoring including the frequency and technique; goal ambulatory blood pressure < 135/85 on average - continue home  medications  - discussed diet (DASH/low sodium), and exercise/weight loss interventions   Hyperlipidemia -LDL goal less than 100 - gave education on dietary changes  PRN follow up unless new symptoms or abnormal test results warranting change in plan   Shared Decision Making/Informed Consent      Patient amenable to plan  Medication Adjustments/Labs and Tests Ordered: Current medicines are reviewed at length with the patient today.  Concerns regarding medicines are outlined above.  No orders of the defined types were placed in this encounter.  No orders of the defined types were placed in this encounter.   Patient Instructions  Medication Instructions:  Your physician recommends that you continue on your current medications as directed. Please refer to the Current Medication list given to you today.  *If you need a refill on your cardiac medications before your next appointment, please call your pharmacy*   Lab Work: None If you have labs (blood work) drawn today and your tests are completely normal, you will receive your results only by: Marland Kitchen MyChart Message (if you have MyChart) OR . A paper copy in the mail If you have any lab test that is abnormal or we need to change your treatment, we will call you to review the results.   Testing/Procedures: None   Follow-Up: At [facility], you and your health needs are our priority.  As part of our continuing mission to provide you with exceptional heart care, we have created designated Provider Care Teams.  These Care Teams include your primary Cardiologist (physician) and Advanced Practice Providers (APPs -  Physician Assistants and Nurse Practitioners) who all work together to provide you with the care you need, when you need it.  We recommend signing up for the patient portal called "MyChart".  Sign up information is provided on this After Visit Summary.  MyChart is used to connect with patients for Virtual Visits  (Telemedicine).  Patients are able to view lab/test results, encounter notes, upcoming appointments, etc.  Non-urgent messages can be sent to your provider as well.   To learn more about what you can do with MyChart, go to [email or web address].    Your next appointment:   As needed  The format for your next appointment:   In Person  Provider:   You may see Dr. Rudean Haskell or one of the following Advanced Practice Providers on your designated Care Team:    Melina Copa, PA-C  Ermalinda Barrios, PA-C    Other Instructions      Signed, Werner Lean, MD  11/21/2020 9:20 AM    [place] v

## 2020-11-21 NOTE — Patient Instructions (Signed)
Medication Instructions:  Your physician recommends that you continue on your current medications as directed. Please refer to the Current Medication list given to you today.  *If you need a refill on your cardiac medications before your next appointment, please call your pharmacy*   Lab Work: None If you have labs (blood work) drawn today and your tests are completely normal, you will receive your results only by: Marland Kitchen MyChart Message (if you have MyChart) OR . A paper copy in the mail If you have any lab test that is abnormal or we need to change your treatment, we will call you to review the results.   Testing/Procedures: None   Follow-Up: At [facility], you and your health needs are our priority.  As part of our continuing mission to provide you with exceptional heart care, we have created designated Provider Care Teams.  These Care Teams include your primary Cardiologist (physician) and Advanced Practice Providers (APPs -  Physician Assistants and Nurse Practitioners) who all work together to provide you with the care you need, when you need it.  We recommend signing up for the patient portal called "MyChart".  Sign up information is provided on this After Visit Summary.  MyChart is used to connect with patients for Virtual Visits (Telemedicine).  Patients are able to view lab/test results, encounter notes, upcoming appointments, etc.  Non-urgent messages can be sent to your provider as well.   To learn more about what you can do with MyChart, go to [email or web address].    Your next appointment:   As needed  The format for your next appointment:   In Person  Provider:   You may see Dr. Rudean Haskell or one of the following Advanced Practice Providers on your designated Care Team:    Melina Copa, PA-C  Ermalinda Barrios, PA-C    Other Instructions

## 2020-12-01 ENCOUNTER — Encounter: Payer: Self-pay | Admitting: Internal Medicine

## 2020-12-01 ENCOUNTER — Ambulatory Visit: Payer: Federal, State, Local not specified - PPO | Admitting: Internal Medicine

## 2020-12-01 ENCOUNTER — Other Ambulatory Visit: Payer: Self-pay

## 2020-12-01 VITALS — BP 118/78 | HR 91 | Temp 98.7°F | Ht 65.0 in | Wt 143.0 lb

## 2020-12-01 DIAGNOSIS — N183 Chronic kidney disease, stage 3 unspecified: Secondary | ICD-10-CM

## 2020-12-01 DIAGNOSIS — D509 Iron deficiency anemia, unspecified: Secondary | ICD-10-CM | POA: Diagnosis not present

## 2020-12-01 DIAGNOSIS — Z0000 Encounter for general adult medical examination without abnormal findings: Secondary | ICD-10-CM | POA: Diagnosis not present

## 2020-12-01 DIAGNOSIS — I10 Essential (primary) hypertension: Secondary | ICD-10-CM

## 2020-12-01 DIAGNOSIS — E538 Deficiency of other specified B group vitamins: Secondary | ICD-10-CM | POA: Diagnosis not present

## 2020-12-01 DIAGNOSIS — M79604 Pain in right leg: Secondary | ICD-10-CM | POA: Diagnosis not present

## 2020-12-01 DIAGNOSIS — M79605 Pain in left leg: Secondary | ICD-10-CM

## 2020-12-01 DIAGNOSIS — R739 Hyperglycemia, unspecified: Secondary | ICD-10-CM

## 2020-12-01 DIAGNOSIS — E559 Vitamin D deficiency, unspecified: Secondary | ICD-10-CM

## 2020-12-01 DIAGNOSIS — Z0001 Encounter for general adult medical examination with abnormal findings: Secondary | ICD-10-CM

## 2020-12-01 NOTE — Patient Instructions (Signed)
You will be contacted regarding the referral for: neurology  Please continue all other medications as before, and refills have been done if requested.  Please have the pharmacy call with any other refills you may need.  Please continue your efforts at being more active, low cholesterol diet, and weight control.  You are otherwise up to date with prevention measures today.  Please keep your appointments with your specialists as you may have planned  Please go to the LAB at the blood drawing area for the tests to be done - at the [facility] lab next week  You will be contacted by phone if any changes need to be made immediately.  Otherwise, you will receive a letter about your results with an explanation, but please check with MyChart first.  Please remember to sign up for MyChart if you have not done so, as this will be important to you in the future with finding out test results, communicating by private email, and scheduling acute appointments online when needed.  Please make an Appointment to return in 6 months, or sooner if needed

## 2020-12-01 NOTE — Progress Notes (Signed)
Subjective:    Patient ID: Darrell Phoenix., male    DOB: April 08, 1951, 69 y.o.   MRN: [id number]  HPI  Here for wellness and f/u;  Overall doing ok;  Pt denies Chest pain, worsening SOB, DOE, wheezing, orthopnea, PND, worsening LE edema, palpitations, dizziness or syncope.  Pt denies neurological change such as new headache, facial or extremity weakness.  Pt denies polydipsia, polyuria, or low sugar symptoms. Pt states overall good compliance with treatment and medications, good tolerability, and has been trying to follow appropriate diet.  Pt denies worsening depressive symptoms, suicidal ideation or panic. No fever, night sweats, loss of appetite, or other constitutional symptoms.  Pt states good ability with ADL's, has low fall risk, home safety reviewed and adequate, no other significant changes in hearing or vision.  Has lost significant wt with recent GI issue followed closely per GI.  Wt Readings from Last 3 Encounters:  12/01/20 143 lb (64.9 kg)  11/21/20 145 lb 12.8 oz (66.1 kg)  11/07/20 144 lb 8 oz (65.5 kg)  Due for hiatial hernia surgury feb 2022.   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  Also has ongoing bilateral leg discomfort with negative eval for PAD.  Denies worsening back pain. Past Medical History:  Diagnosis Date  . ALCOHOL ABUSE, HX OF 10/03/2007  . ALLERGIC RHINITIS 10/03/2007  . ANEMIA-B12 DEFICIENCY 12/04/2009  . ANEMIA-IRON DEFICIENCY 11/27/2009  . ANEMIA-NOS 10/08/2007  . ASTHMA 10/03/2007  . Asthma   . B12 DEFICIENCY 10/06/2008  . CKD (chronic kidney disease) stage 3, GFR 30-59 ml/min (HCC) 03/31/2018  . COLONIC POLYPS, HX OF 10/03/2007  . ERECTILE DYSFUNCTION 10/03/2007  . GERD 10/03/2007  . History of hiatal hernia    found on CXT in June 2021  . HYPERLIPIDEMIA 10/03/2007  . HYPERTENSION 10/03/2007  . KNEE PAIN, RIGHT 10/08/2007  . Morbid obesity ([place]) 10/03/2007   . Neuromuscular disorder ([place])    per pt. never diagnosed  . Substance abuse ([place])    marijuana daily   Past Surgical History:  Procedure Laterality Date  . bilateral hydrocele repair    . COLONOSCOPY  06/06/2016  . POLYPECTOMY  01-03-2010  . s/p left knee arthroscopy  2009  . s/p TKR  03/2009  . VASECTOMY      reports that he has never smoked. He has never used smokeless tobacco. He reports previous alcohol use. He reports current drug use. Drug: Marijuana. family history includes Alcohol abuse in his paternal uncle; Breast cancer in his mother; Coronary artery disease in an other family member; Diabetes in his cousin and sister; Hypertension in his sister; Lupus in his sister. No Known Allergies Current Outpatient Medications on File Prior to Visit  Medication Sig Dispense Refill  . albuterol (PROVENTIL HFA) 108 (90 Base) MCG/ACT inhaler Inhale 2 puffs into the lungs every 4 (four) hours as needed. 18 g 2  . ASPIRIN LOW DOSE 81 MG EC tablet TAKE 1 TABLET(81 MG) BY MOUTH DAILY. SWALLOW WHOLE 90 tablet 2  . CIALIS 20 MG tablet TAKE 1 TABLET BY MOUTH DAILY AS NEEDED 5 tablet 11  . fluticasone (FLONASE) 50 MCG/ACT nasal spray Place 2 sprays into both nostrils daily. 16 g 5  . metoCLOPramide (REGLAN) 5 MG tablet Take 1 tablet (5 mg total) by mouth 4 (four) times daily -  before meals and at bedtime. 120 tablet 1  . omeprazole (PRILOSEC)  40 MG capsule Take 1 capsule (40 mg total) by mouth in the morning and at bedtime. 60 capsule 2  . vitamin B-12 (CYANOCOBALAMIN) 1000 MCG tablet Take 1 tablet (1,000 mcg total) by mouth daily. 90 tablet 3  . WIXELA INHUB 500-50 MCG/DOSE AEPB INHALE 1 PUFF INTO THE LUNGS TWICE DAILY 60 each 11   No current facility-administered medications on file prior to visit.   Review of Systems All otherwise neg per pt    Objective:   Physical Exam BP 118/78 (BP Location: Left Arm, Patient Position: Sitting, Cuff Size: Large)   Pulse 91   Temp 98.7 F (37.1 C)  (Oral)   Ht 5\' 5"  (1.651 m)   Wt 143 lb (64.9 kg)   SpO2 99%   BMI 23.80 kg/m  VS noted,  Constitutional: Pt appears in NAD HENT: Head: NCAT.  Right Ear: External ear normal.  Left Ear: External ear normal.  Eyes: . Pupils are equal, round, and reactive to light. Conjunctivae and EOM are normal Nose: without d/c or deformity Neck: Neck supple. Gross normal ROM Cardiovascular: Normal rate and regular rhythm.   Pulmonary/Chest: Effort normal and breath sounds without rales or wheezing.  Abd:  Soft, NT, ND, + BS, no organomegaly Neurological: Pt is alert. At baseline orientation, motor grossly intact Skin: Skin is warm. No rashes, other new lesions, no LE edema Psychiatric: Pt behavior is normal without agitation  All otherwise neg per pt Lab Results  Component Value Date   WBC 9.4 11/07/2020   HGB 11.9 (L) 11/07/2020   HCT 38.0 (L) 11/07/2020   PLT 238 11/07/2020   GLUCOSE 99 11/07/2020   CHOL 153 06/07/2020   TRIG 284.0 (H) 06/07/2020   HDL 37.60 (L) 06/07/2020   LDLDIRECT 71.0 06/07/2020   LDLCALC 81 09/25/2017   ALT 16 11/07/2020   AST 17 11/07/2020   NA 139 11/07/2020   K 3.7 11/07/2020   CL 105 11/07/2020   CREATININE 1.04 11/07/2020   BUN 12 11/07/2020   CO2 24 11/07/2020   TSH 3.45 06/07/2020   PSA 1.00 11/04/2019   INR 2.5 (H) 04/23/2009   HGBA1C 6.3 06/07/2020      Assessment & Plan:

## 2020-12-02 ENCOUNTER — Encounter: Payer: Self-pay | Admitting: Internal Medicine

## 2020-12-02 NOTE — Assessment & Plan Note (Signed)
BP Readings from Last 3 Encounters:  12/01/20 118/78  11/21/20 114/82  11/07/20 122/84  stable overall by history and exam, recent data reviewed with pt, and pt to continue medical treatment as before,  to f/u any worsening symptoms or concerns

## 2020-12-02 NOTE — Assessment & Plan Note (Signed)

## 2020-12-02 NOTE — Assessment & Plan Note (Signed)
Cont oral replacement 

## 2020-12-02 NOTE — Assessment & Plan Note (Signed)
stable overall by history and exam, recent data reviewed with pt, and pt to continue medical treatment as before,  to f/u any worsening symptoms or concerns  

## 2020-12-02 NOTE — Assessment & Plan Note (Signed)
Also for iron lab f/u

## 2020-12-02 NOTE — Assessment & Plan Note (Signed)
Also for b12 lab f/u, cont oral replacement

## 2020-12-02 NOTE — Assessment & Plan Note (Addendum)
Etiology unclear, ? Neuropathy, for neurology referral  I spent 31 minutes in addition to time for CPX wellness examination in preparing to see the patient by review of recent labs, imaging and procedures, obtaining and reviewing separately obtained history, communicating with the patient and family or caregiver, ordering medications, tests or procedures, and documenting clinical information in the EHR including the differential Dx, treatment, and any further evaluation and other management of bilateral leg discomfort, b12, iron and D deficiency, htn, hyperglycemia, ckd,

## 2020-12-04 ENCOUNTER — Ambulatory Visit: Payer: Federal, State, Local not specified - PPO | Admitting: Internal Medicine

## 2020-12-05 ENCOUNTER — Encounter: Payer: Self-pay | Admitting: Neurology

## 2020-12-11 ENCOUNTER — Encounter: Payer: Self-pay | Admitting: Internal Medicine

## 2020-12-11 ENCOUNTER — Other Ambulatory Visit (INDEPENDENT_AMBULATORY_CARE_PROVIDER_SITE_OTHER): Payer: Federal, State, Local not specified - PPO

## 2020-12-11 DIAGNOSIS — I10 Essential (primary) hypertension: Secondary | ICD-10-CM

## 2020-12-11 DIAGNOSIS — N183 Chronic kidney disease, stage 3 unspecified: Secondary | ICD-10-CM

## 2020-12-11 DIAGNOSIS — D509 Iron deficiency anemia, unspecified: Secondary | ICD-10-CM | POA: Diagnosis not present

## 2020-12-11 DIAGNOSIS — E559 Vitamin D deficiency, unspecified: Secondary | ICD-10-CM | POA: Diagnosis not present

## 2020-12-11 DIAGNOSIS — Z0001 Encounter for general adult medical examination with abnormal findings: Secondary | ICD-10-CM

## 2020-12-11 DIAGNOSIS — R739 Hyperglycemia, unspecified: Secondary | ICD-10-CM | POA: Diagnosis not present

## 2020-12-11 DIAGNOSIS — Z125 Encounter for screening for malignant neoplasm of prostate: Secondary | ICD-10-CM | POA: Diagnosis not present

## 2020-12-11 DIAGNOSIS — E538 Deficiency of other specified B group vitamins: Secondary | ICD-10-CM

## 2020-12-11 LAB — HEPATIC FUNCTION PANEL
ALT: 12 U/L (ref 0–53)
AST: 14 U/L (ref 0–37)
Albumin: 4.1 g/dL (ref 3.5–5.2)
Alkaline Phosphatase: 38 U/L — ABNORMAL LOW (ref 39–117)
Bilirubin, Direct: 0.1 mg/dL (ref 0.0–0.3)
Total Bilirubin: 0.6 mg/dL (ref 0.2–1.2)
Total Protein: 6.5 g/dL (ref 6.0–8.3)

## 2020-12-11 LAB — IBC PANEL
Iron: 83 ug/dL (ref 42–165)
Saturation Ratios: 29.9 % (ref 20.0–50.0)
Transferrin: 198 mg/dL — ABNORMAL LOW (ref 212.0–360.0)

## 2020-12-11 LAB — LIPID PANEL
Cholesterol: 142 mg/dL (ref 0–200)
HDL: 41.4 mg/dL (ref >39.00)
LDL Cholesterol: 78 mg/dL (ref 0–99)
NonHDL: 100.3
Total CHOL/HDL Ratio: 3
Triglycerides: 114 mg/dL (ref 0.0–149.0)
VLDL: 22.8 mg/dL (ref 0.0–40.0)

## 2020-12-11 LAB — CBC WITH DIFFERENTIAL/PLATELET
Basophils Absolute: 0 10*3/uL (ref 0.0–0.1)
Basophils Relative: 0.3 % (ref 0.0–3.0)
Eosinophils Absolute: 0.1 10*3/uL (ref 0.0–0.7)
Eosinophils Relative: 0.8 % (ref 0.0–5.0)
HCT: 38.3 % — ABNORMAL LOW (ref 39.0–52.0)
Hemoglobin: 12.4 g/dL — ABNORMAL LOW (ref 13.0–17.0)
Lymphocytes Relative: 32.9 % (ref 12.0–46.0)
Lymphs Abs: 2.6 10*3/uL (ref 0.7–4.0)
MCHC: 32.5 g/dL (ref 30.0–36.0)
MCV: 83.8 fl (ref 78.0–100.0)
Monocytes Absolute: 0.3 10*3/uL (ref 0.1–1.0)
Monocytes Relative: 4 % (ref 3.0–12.0)
Neutro Abs: 5 10*3/uL (ref 1.4–7.7)
Neutrophils Relative %: 62 % (ref 43.0–77.0)
Platelets: 247 10*3/uL (ref 150.0–400.0)
RBC: 4.56 Mil/uL (ref 4.22–5.81)
RDW: 14.1 % (ref 11.5–15.5)
WBC: 8 10*3/uL (ref 4.0–10.5)

## 2020-12-11 LAB — URINALYSIS, ROUTINE W REFLEX MICROSCOPIC
Bilirubin Urine: NEGATIVE
Hgb urine dipstick: NEGATIVE
Ketones, ur: NEGATIVE
Leukocytes,Ua: NEGATIVE
Nitrite: NEGATIVE
Specific Gravity, Urine: 1.025 (ref 1.000–1.030)
Total Protein, Urine: NEGATIVE
Urine Glucose: NEGATIVE
Urobilinogen, UA: 0.2 (ref 0.0–1.0)
pH: 6 (ref 5.0–8.0)

## 2020-12-11 LAB — VITAMIN B12: Vitamin B-12: 561 pg/mL (ref 211–911)

## 2020-12-11 LAB — VITAMIN D 25 HYDROXY (VIT D DEFICIENCY, FRACTURES): VITD: 32 ng/mL (ref 30.00–100.00)

## 2020-12-11 LAB — BASIC METABOLIC PANEL
BUN: 17 mg/dL (ref 6–23)
CO2: 28 mEq/L (ref 19–32)
Calcium: 9.4 mg/dL (ref 8.4–10.5)
Chloride: 106 mEq/L (ref 96–112)
Creatinine, Ser: 1.07 mg/dL (ref 0.40–1.50)
GFR: 71.37 mL/min (ref >60.00)
Glucose, Bld: 98 mg/dL (ref 70–99)
Potassium: 3.8 mEq/L (ref 3.5–5.1)
Sodium: 142 mEq/L (ref 135–145)

## 2020-12-11 LAB — PHOSPHORUS: Phosphorus: 3.4 mg/dL (ref 2.3–4.6)

## 2020-12-11 LAB — PSA: PSA: 0.56 ng/mL (ref 0.10–4.00)

## 2020-12-11 LAB — TSH: TSH: 2.11 u[IU]/mL (ref 0.35–4.50)

## 2020-12-11 LAB — HEMOGLOBIN A1C: Hgb A1c MFr Bld: 5.7 % (ref 4.6–6.5)

## 2020-12-13 LAB — PTH, INTACT AND CALCIUM
Calcium: 9.3 mg/dL (ref 8.6–10.3)
PTH: 28 pg/mL (ref 14–64)

## 2021-01-01 ENCOUNTER — Emergency Department (HOSPITAL_BASED_OUTPATIENT_CLINIC_OR_DEPARTMENT_OTHER): Payer: Federal, State, Local not specified - PPO

## 2021-01-01 ENCOUNTER — Other Ambulatory Visit: Payer: Self-pay

## 2021-01-01 ENCOUNTER — Inpatient Hospital Stay (HOSPITAL_BASED_OUTPATIENT_CLINIC_OR_DEPARTMENT_OTHER)
Admission: EM | Admit: 2021-01-01 | Discharge: 2021-01-05 | DRG: 327 | Disposition: A | Discharge status: Home / Self Care | Payer: Federal, State, Local not specified - PPO | Attending: General Surgery | Admitting: General Surgery

## 2021-01-01 ENCOUNTER — Encounter (HOSPITAL_BASED_OUTPATIENT_CLINIC_OR_DEPARTMENT_OTHER): Payer: Self-pay | Admitting: Emergency Medicine

## 2021-01-01 DIAGNOSIS — K311 Adult hypertrophic pyloric stenosis: Secondary | ICD-10-CM | POA: Diagnosis not present

## 2021-01-01 DIAGNOSIS — K315 Obstruction of duodenum: Secondary | ICD-10-CM | POA: Diagnosis not present

## 2021-01-01 DIAGNOSIS — E785 Hyperlipidemia, unspecified: Secondary | ICD-10-CM | POA: Diagnosis not present

## 2021-01-01 DIAGNOSIS — Z20822 Contact with and (suspected) exposure to covid-19: Secondary | ICD-10-CM | POA: Diagnosis present

## 2021-01-01 DIAGNOSIS — K3189 Other diseases of stomach and duodenum: Secondary | ICD-10-CM | POA: Diagnosis not present

## 2021-01-01 DIAGNOSIS — Z803 Family history of malignant neoplasm of breast: Secondary | ICD-10-CM | POA: Diagnosis not present

## 2021-01-01 DIAGNOSIS — Z6822 Body mass index (BMI) 22.0-22.9, adult: Secondary | ICD-10-CM

## 2021-01-01 DIAGNOSIS — Z832 Family history of diseases of the blood and blood-forming organs and certain disorders involving the immune mechanism: Secondary | ICD-10-CM

## 2021-01-01 DIAGNOSIS — K219 Gastro-esophageal reflux disease without esophagitis: Secondary | ICD-10-CM | POA: Diagnosis not present

## 2021-01-01 DIAGNOSIS — Z833 Family history of diabetes mellitus: Secondary | ICD-10-CM | POA: Diagnosis not present

## 2021-01-01 DIAGNOSIS — Z8249 Family history of ischemic heart disease and other diseases of the circulatory system: Secondary | ICD-10-CM | POA: Diagnosis not present

## 2021-01-01 DIAGNOSIS — I10 Essential (primary) hypertension: Secondary | ICD-10-CM | POA: Diagnosis present

## 2021-01-01 DIAGNOSIS — R112 Nausea with vomiting, unspecified: Secondary | ICD-10-CM

## 2021-01-01 DIAGNOSIS — D519 Vitamin B12 deficiency anemia, unspecified: Secondary | ICD-10-CM | POA: Diagnosis not present

## 2021-01-01 DIAGNOSIS — N281 Cyst of kidney, acquired: Secondary | ICD-10-CM | POA: Diagnosis not present

## 2021-01-01 DIAGNOSIS — Z8719 Personal history of other diseases of the digestive system: Secondary | ICD-10-CM

## 2021-01-01 DIAGNOSIS — Z4682 Encounter for fitting and adjustment of non-vascular catheter: Secondary | ICD-10-CM | POA: Diagnosis not present

## 2021-01-01 DIAGNOSIS — Z9889 Other specified postprocedural states: Secondary | ICD-10-CM

## 2021-01-01 DIAGNOSIS — R1111 Vomiting without nausea: Secondary | ICD-10-CM | POA: Diagnosis not present

## 2021-01-01 DIAGNOSIS — K440 Diaphragmatic hernia with obstruction, without gangrene: Secondary | ICD-10-CM | POA: Diagnosis not present

## 2021-01-01 DIAGNOSIS — F17200 Nicotine dependence, unspecified, uncomplicated: Secondary | ICD-10-CM | POA: Diagnosis present

## 2021-01-01 DIAGNOSIS — J9811 Atelectasis: Secondary | ICD-10-CM | POA: Diagnosis not present

## 2021-01-01 DIAGNOSIS — E46 Unspecified protein-calorie malnutrition: Secondary | ICD-10-CM | POA: Diagnosis present

## 2021-01-01 DIAGNOSIS — E440 Moderate protein-calorie malnutrition: Secondary | ICD-10-CM | POA: Insufficient documentation

## 2021-01-01 DIAGNOSIS — K449 Diaphragmatic hernia without obstruction or gangrene: Secondary | ICD-10-CM | POA: Diagnosis not present

## 2021-01-01 DIAGNOSIS — R5381 Other malaise: Secondary | ICD-10-CM | POA: Diagnosis not present

## 2021-01-01 LAB — URINALYSIS, MICROSCOPIC (REFLEX)

## 2021-01-01 LAB — RESP PANEL BY RT-PCR (FLU A&B, COVID) ARPGX2
Influenza A by PCR: NEGATIVE
Influenza B by PCR: NEGATIVE
SARS Coronavirus 2 by RT PCR: NEGATIVE

## 2021-01-01 LAB — URINALYSIS, ROUTINE W REFLEX MICROSCOPIC
Bilirubin Urine: NEGATIVE
Glucose, UA: NEGATIVE mg/dL
Hgb urine dipstick: NEGATIVE
Ketones, ur: 40 mg/dL — AB
Leukocytes,Ua: NEGATIVE
Nitrite: NEGATIVE
Protein, ur: 30 mg/dL — AB
Specific Gravity, Urine: 1.03 (ref 1.005–1.030)
pH: 6 (ref 5.0–8.0)

## 2021-01-01 LAB — COMPREHENSIVE METABOLIC PANEL
ALT: 17 U/L (ref 0–44)
AST: 24 U/L (ref 15–41)
Albumin: 4.7 g/dL (ref 3.5–5.0)
Alkaline Phosphatase: 36 U/L — ABNORMAL LOW (ref 38–126)
Anion gap: 16 — ABNORMAL HIGH (ref 5–15)
BUN: 18 mg/dL (ref 8–23)
CO2: 27 mmol/L (ref 22–32)
Calcium: 10.1 mg/dL (ref 8.9–10.3)
Chloride: 100 mmol/L (ref 98–111)
Creatinine, Ser: 1.2 mg/dL (ref 0.61–1.24)
GFR, Estimated: >60 mL/min (ref >60)
Glucose, Bld: 133 mg/dL — ABNORMAL HIGH (ref 70–99)
Potassium: 4 mmol/L (ref 3.5–5.1)
Sodium: 143 mmol/L (ref 135–145)
Total Bilirubin: 1 mg/dL (ref 0.3–1.2)
Total Protein: 7.8 g/dL (ref 6.5–8.1)

## 2021-01-01 LAB — CBC
HCT: 38.1 % — ABNORMAL LOW (ref 39.0–52.0)
Hemoglobin: 12.5 g/dL — ABNORMAL LOW (ref 13.0–17.0)
MCH: 27.7 pg (ref 26.0–34.0)
MCHC: 32.8 g/dL (ref 30.0–36.0)
MCV: 84.5 fL (ref 80.0–100.0)
Platelets: 224 10*3/uL (ref 150–400)
RBC: 4.51 MIL/uL (ref 4.22–5.81)
RDW: 13.7 % (ref 11.5–15.5)
WBC: 14 10*3/uL — ABNORMAL HIGH (ref 4.0–10.5)
nRBC: 0 % (ref 0.0–0.2)

## 2021-01-01 LAB — LIPASE, BLOOD: Lipase: 17 U/L (ref 11–51)

## 2021-01-01 LAB — HIV ANTIBODY (ROUTINE TESTING W REFLEX): HIV Screen 4th Generation wRfx: NONREACTIVE

## 2021-01-01 MED ORDER — HYDROMORPHONE HCL 1 MG/ML IJ SOLN
1.0000 mg | INTRAMUSCULAR | Status: DC | PRN
  Administered 2021-01-01: 1 mg via INTRAVENOUS
  Filled 2021-01-01: qty 1

## 2021-01-01 MED ORDER — MORPHINE SULFATE (PF) 4 MG/ML IV SOLN
4.0000 mg | Freq: Once | INTRAVENOUS | Status: AC
Start: 2021-01-01 — End: 2021-01-01
  Administered 2021-01-01: 4 mg via INTRAVENOUS
  Filled 2021-01-01: qty 1

## 2021-01-01 MED ORDER — SODIUM CHLORIDE 0.9 % IV BOLUS
1000.0000 mL | Freq: Once | INTRAVENOUS | Status: AC
  Administered 2021-01-01: 1000 mL via INTRAVENOUS

## 2021-01-01 MED ORDER — IOHEXOL 300 MG/ML  SOLN
100.0000 mL | Freq: Once | INTRAMUSCULAR | Status: AC | PRN
  Administered 2021-01-01: 100 mL via INTRAVENOUS

## 2021-01-01 MED ORDER — ONDANSETRON HCL 4 MG/2ML IJ SOLN
4.0000 mg | Freq: Four times a day (QID) | INTRAMUSCULAR | Status: DC | PRN
  Administered 2021-01-01: 4 mg via INTRAVENOUS
  Filled 2021-01-01: qty 2

## 2021-01-01 MED ORDER — PROMETHAZINE HCL 25 MG/ML IJ SOLN
12.5000 mg | Freq: Once | INTRAMUSCULAR | Status: AC
  Administered 2021-01-01: 12.5 mg via INTRAVENOUS
  Filled 2021-01-01: qty 1

## 2021-01-01 MED ORDER — ONDANSETRON 4 MG PO TBDP: 4.0000 mg | ORAL_TABLET | Freq: Four times a day (QID) | ORAL | Status: DC | PRN

## 2021-01-01 MED ORDER — ENOXAPARIN SODIUM 40 MG/0.4ML ~~LOC~~ SOLN
40.0000 mg | SUBCUTANEOUS | Status: DC
  Administered 2021-01-01 – 2021-01-03 (×3): 40 mg via SUBCUTANEOUS
  Filled 2021-01-01 (×3): qty 0.4

## 2021-01-01 MED ORDER — SODIUM CHLORIDE 0.9 % IV SOLN: INTRAVENOUS | Status: DC

## 2021-01-01 MED ORDER — DEXTROSE-NACL 5-0.9 % IV SOLN: INTRAVENOUS | Status: DC

## 2021-01-01 MED ORDER — METOPROLOL TARTRATE 5 MG/5ML IV SOLN: 5.0000 mg | Freq: Four times a day (QID) | INTRAVENOUS | Status: DC | PRN

## 2021-01-01 NOTE — ED Provider Notes (Signed)
[facility] Provider Note   CSN: [id number] Arrival date & time: 01/01/21  1121     History Chief Complaint  Patient presents with  . Emesis    Darrell Andress. is a 70 y.o. male w/ hx of large hiatal hernia pending surgical repair on 01/17/2021 here with n/v.  Patient reports that he has had epigastric pain, persistent nausea and vomiting for at least 24 hours.  He said he has been having difficulty with bowel movements for the past several days, and had a very small bowel movement to help of a suppository several days ago.  He is not passing gas.  He says he is unable to keep any water down.  He says within minutes of eating or drinking anything, he vomits it right back up.  He says he is never had symptoms this bad before.  He has an upcoming surgery for repair of his large hiatal hernia on February 2 with a [facility] Surgeon Dr Ralene Ok  Last CT abdomen on 06/22/20:  CLINICAL DATA:  Abdominal distension and weight loss  EXAM: CT ABDOMEN AND PELVIS WITH CONTRAST  TECHNIQUE: Multidetector CT imaging of the abdomen and pelvis was performed using the standard protocol following bolus administration of intravenous contrast.  CONTRAST:  136mL ISOVUE-300 IOPAMIDOL (ISOVUE-300) INJECTION 61%  COMPARISON:  None.  FINDINGS: Lower chest: Lung bases demonstrate a large hiatal hernia with the entire stomach in the right chest cavity. Portions of the transverse colon are noted within the hernia as well.  Hepatobiliary: No focal liver abnormality is seen. No gallstones, gallbladder wall thickening, or biliary dilatation.  Pancreas: Unremarkable. No pancreatic ductal dilatation or surrounding inflammatory changes.  Spleen: Normal in size without focal abnormality.  Adrenals/Urinary Tract: Adrenal glands appear within normal limits. Kidneys demonstrate a normal enhancement pattern. No renal calculi or urinary tract obstructive changes are seen.  Central simple appearing left renal cyst is noted measuring 2.4 cm. No obstructive changes are seen. The bladder is decompressed.  Stomach/Bowel: Colon shows no obstructive or inflammatory changes. Again a loop of transverse colon is noted within the large hiatal hernia although this causes no obstructive change. The appendix is within normal limits. Small bowel shows no obstructive or inflammatory changes. Stomach is within the right chest cavity consistent with hernia.  Vascular/Lymphatic: Left retroaortic renal vein is noted. Abdominal aorta demonstrates mild atherosclerotic calcifications without aneurysmal dilatation. No sizable lymphadenopathy is noted.  Reproductive: Prostate is unremarkable.  Other: No abdominal wall hernia or abnormality. No abdominopelvic ascites.  Musculoskeletal: No acute or significant osseous findings.  IMPRESSION: Large hiatal hernia as described. No acute complicating factors are noted.  Chronic changes without acute abnormality.  HPI     Past Medical History:  Diagnosis Date  . ALCOHOL ABUSE, HX OF 10/03/2007  . ALLERGIC RHINITIS 10/03/2007  . ANEMIA-B12 DEFICIENCY 12/04/2009  . ANEMIA-IRON DEFICIENCY 11/27/2009  . ANEMIA-NOS 10/08/2007  . ASTHMA 10/03/2007  . Asthma   . B12 DEFICIENCY 10/06/2008  . CKD (chronic kidney disease) stage 3, GFR 30-59 ml/min (HCC) 03/31/2018  . COLONIC POLYPS, HX OF 10/03/2007  . ERECTILE DYSFUNCTION 10/03/2007  . GERD 10/03/2007  . History of hiatal hernia    found on CXT in June 2021  . HYPERLIPIDEMIA 10/03/2007  . HYPERTENSION 10/03/2007  . KNEE PAIN, RIGHT 10/08/2007  . Morbid obesity ([place]) 10/03/2007  . Neuromuscular disorder ([place])    per pt. never diagnosed  . Substance abuse ([place])    marijuana daily  Patient Active Problem List   Diagnosis Date Noted  . Gastric outlet obstruction 01/01/2021  . Preoperative cardiovascular examination 11/21/2020  . Bilateral leg pain 08/23/2020   . Dysphagia 08/09/2020  . Tetrahydrocannabinol (THC) use disorder, moderate, dependence ([place]) 08/09/2020  . Weight loss 06/07/2020  . Vitamin D deficiency 06/07/2020  . Cough 02/10/2019  . Subacromial bursitis of left shoulder joint 01/19/2019  . CKD (chronic kidney disease) stage 3, GFR 30-59 ml/min (HCC) 03/31/2018  . Hyperglycemia 09/25/2017  . Daytime somnolence 09/25/2017  . Finger pain, right 03/27/2017  . History of colonic polyps 03/27/2016  . Pre-ulcerative corn or callous 03/27/2016  . Skin nodule 01/10/2016  . Venereal disease, unspecified 03/23/2012  . Renal insufficiency 04/02/2011  . Right elbow pain 04/02/2011  . Encounter for well adult exam with abnormal findings 03/30/2011  . ANEMIA-B12 DEFICIENCY 12/04/2009  . ANEMIA-IRON DEFICIENCY 11/27/2009  . B12 deficiency 10/06/2008  . KNEE PAIN, RIGHT 10/08/2007  . Hyperlipidemia 10/03/2007  . Morbid obesity ([place]) 10/03/2007  . ERECTILE DYSFUNCTION 10/03/2007  . Essential hypertension 10/03/2007  . ALLERGIC RHINITIS 10/03/2007  . Asthma 10/03/2007  . GERD 10/03/2007  . ALCOHOL ABUSE, HX OF 10/03/2007  . COLONIC POLYPS, HX OF 10/03/2007    Past Surgical History:  Procedure Laterality Date  . bilateral hydrocele repair    . COLONOSCOPY  06/06/2016  . POLYPECTOMY  01-03-2010  . s/p left knee arthroscopy  2009  . s/p TKR  03/2009  . VASECTOMY         Family History  Problem Relation Age of Onset  . Breast cancer Mother   . Lupus Sister   . Hypertension Sister   . Diabetes Sister   . Alcohol abuse Paternal Uncle   . Coronary artery disease Other        male 1st degree relative - MI at 69 yo  . Diabetes Cousin        2 cousins  . Colon cancer Neg Hx   . Esophageal cancer Neg Hx   . Rectal cancer Neg Hx   . Stomach cancer Neg Hx   . Colon polyps Neg Hx     Social History   Tobacco Use  . Smoking status: Current Some Day Smoker  . Smokeless tobacco: Never Used  Vaping Use  . Vaping Use: Never  used  Substance Use Topics  . Alcohol use: Not Currently    Alcohol/week: 0.0 standard drinks  . Drug use: Yes    Types: Marijuana    Comment: marijuana daily    Home Medications Prior to Admission medications   Medication Sig Start Date End Date Taking? Authorizing Provider  albuterol (PROVENTIL HFA) 108 (90 Base) MCG/ACT inhaler Inhale 2 puffs into the lungs every 4 (four) hours as needed. 11/03/19   Biagio Borg, MD  ASPIRIN LOW DOSE 81 MG EC tablet TAKE 1 TABLET(81 MG) BY MOUTH DAILY. SWALLOW WHOLE 01/17/20   Biagio Borg, MD  CIALIS 20 MG tablet TAKE 1 TABLET BY MOUTH DAILY AS NEEDED 04/23/12   Biagio Borg, MD  fluticasone [facility]) 50 MCG/ACT nasal spray Place 2 sprays into both nostrils daily. 03/17/19   Biagio Borg, MD  metoCLOPramide (REGLAN) 5 MG tablet Take 1 tablet (5 mg total) by mouth 4 (four) times daily -  before meals and at bedtime. 10/23/20   Ladene Artist, MD  omeprazole (PRILOSEC) 40 MG capsule Take 1 capsule (40 mg total) by mouth in the morning and at bedtime. 10/23/20  Ladene Artist, MD  vitamin B-12 (CYANOCOBALAMIN) 1000 MCG tablet Take 1 tablet (1,000 mcg total) by mouth daily. 06/07/20   Biagio Borg, MD  Grant Ruts INHUB 500-50 MCG/DOSE AEPB INHALE 1 PUFF INTO THE LUNGS TWICE DAILY 11/13/20   Biagio Borg, MD    Allergies    Patient has no known allergies.  Review of Systems   Review of Systems  Constitutional: Negative for chills and fever.  Respiratory: Negative for cough and shortness of breath.   Cardiovascular: Negative for chest pain and palpitations.  Gastrointestinal: Positive for abdominal pain, constipation, nausea and vomiting.  Skin: Negative for color change and rash.  Neurological: Negative for syncope and headaches.  All other systems reviewed and are negative.   Physical Exam Updated Vital Signs BP (!) 131/96   Pulse (!) 103   Temp 98.3 F (36.8 C) (Oral)   Resp 18   Ht 5\' 6"  (1.676 m)   Wt 67.1 kg   SpO2 95%   BMI 23.89  kg/m   Physical Exam Constitutional:      General: He is not in acute distress.    Comments: Belching  HENT:     Head: Normocephalic and atraumatic.  Eyes:     Conjunctiva/sclera: Conjunctivae normal.     Pupils: Pupils are equal, round, and reactive to light.  Cardiovascular:     Rate and Rhythm: Normal rate and regular rhythm.     Pulses: Normal pulses.  Pulmonary:     Effort: Pulmonary effort is normal. No respiratory distress.  Abdominal:     General: There is no distension.     Tenderness: There is no abdominal tenderness. There is no guarding.  Skin:    General: Skin is warm and dry.  Neurological:     General: No focal deficit present.     Mental Status: He is alert. Mental status is at baseline.     ED Results / Procedures / Treatments   Labs (all labs ordered are listed, but only abnormal results are displayed) Labs Reviewed  COMPREHENSIVE METABOLIC PANEL - Abnormal; Notable for the following components:      Result Value   Glucose, Bld 133 (*)    Alkaline Phosphatase 36 (*)    Anion gap 16 (*)    All other components within normal limits  CBC - Abnormal; Notable for the following components:   WBC 14.0 (*)    Hemoglobin 12.5 (*)    HCT 38.1 (*)    All other components within normal limits  URINALYSIS, ROUTINE W REFLEX MICROSCOPIC - Abnormal; Notable for the following components:   Ketones, ur 40 (*)    Protein, ur 30 (*)    All other components within normal limits  URINALYSIS, MICROSCOPIC (REFLEX) - Abnormal; Notable for the following components:   Bacteria, UA FEW (*)    All other components within normal limits  LIPASE, BLOOD  HIV ANTIBODY (ROUTINE TESTING W REFLEX)    EKG None  Radiology CT CHEST ABDOMEN PELVIS W CONTRAST  Result Date: 01/01/2021 CLINICAL DATA:  Vomiting since yesterday. Known diaphragmatic hernia with surgery scheduled in 2 weeks. EXAM: CT CHEST, ABDOMEN, AND PELVIS WITH CONTRAST TECHNIQUE: Multidetector CT imaging of the  chest, abdomen and pelvis was performed following the standard protocol during bolus administration of intravenous contrast. CONTRAST:  128mL OMNIPAQUE IOHEXOL 300 MG/ML  SOLN COMPARISON:  Abdominopelvic CT 06/22/2020. Upper GI series 09/04/2020. FINDINGS: CT CHEST FINDINGS Cardiovascular: Atherosclerosis of the aorta, great vessels and coronary arteries.  No acute vascular findings. The heart size is normal. There is no pericardial effusion. Mediastinum/Nodes: There are no enlarged mediastinal, hilar or axillary lymph nodes. Again noted is a large hiatal hernia containing all of the stomach, herniated into the posterior right hemithorax. Compared with the prior CT, the intrathoracic stomach is significantly distended and fluid-filled, with mass effect on the heart which is displaced anteriorly. Lungs/Pleura: There is no pleural effusion or pneumothorax. Increased compressive atelectasis within the right lower and middle lobes by the enlarging hiatal hernia. Mild atelectasis medially in the left lower lobe. Mild underlying centrilobular emphysema. Musculoskeletal/Chest wall: No chest wall mass or suspicious osseous findings. Stable degenerative changes in the spine, shoulders and sternoclavicular joints. CT ABDOMEN AND PELVIS FINDINGS Hepatobiliary: The liver is normal in density without suspicious focal abnormality. No evidence of gallstones, gallbladder wall thickening or biliary dilatation. Pancreas: Unremarkable. No pancreatic ductal dilatation or surrounding inflammatory changes. Spleen: Normal in size without focal abnormality. Adrenals/Urinary Tract: Both adrenal glands appear normal. Stable central left parapelvic renal cyst. No hydronephrosis or urinary tract calculus. The bladder appears unremarkable. Stomach/Bowel: No enteric contrast was administered. As above, the entire stomach is herniated into the lower right chest. There is progressive marked fluid distension of the stomach. The configuration of the  antrum and duodenum is unchanged, without twisting to suggest a volvulus. No evidence of bowel wall thickening, distention or surrounding inflammation. The appendix appears normal. Vascular/Lymphatic: There are no enlarged abdominal or pelvic lymph nodes. No acute vascular findings. There is mild aortic and branch vessel atherosclerosis. The distended stomach exerts mass effect on the inferior cavoatrial junction. The portal, superior mesenteric and splenic veins are patent. Reproductive: Stable mild enlargement of the prostate gland. The seminal vesicles appear normal. Other: Stable small supraumbilical hernia containing only fat. No herniated bowel. No ascites or free air. Musculoskeletal: No acute or significant osseous findings. Lumbar spondylosis noted. IMPRESSION: 1. Marked progressive fluid distension of the intrathoracic stomach in this patient with a known large hiatal hernia suggesting gastric outlet obstruction, etiology not demonstrated. No gastric volvulus identified. The entire stomach is herniated into the posterior right chest. Patient may benefit from nasogastric tube placement. 2. Increased compressive atelectasis in the right lower and middle lobes by the enlarging hiatal hernia. 3. No other acute findings. 4. Aortic Atherosclerosis (ICD10-I70.0) and Emphysema (ICD10-J43.9). Electronically Signed   By: Richardean Sale M.D.   On: 01/01/2021 14:37   DG Chest Portable 1 View  Result Date: 01/01/2021 CLINICAL DATA:  NG tube placement EXAM: PORTABLE CHEST 1 VIEW COMPARISON:  CT 01/01/2021, fluoroscopic examination 09/04/2020 FINDINGS: Subsegmental atelectasis at the right base. Esophageal tube tip overlies the right lower chest, based on comparison CT and fluoroscopic exam, this is presumably within intrathoracic stomach secondary to large hernia. Gastric distension appears decreased compared to scout image from the CT. There is excreted contrast within the kidneys. IMPRESSION: Esophageal tube tip  overlies the right lower chest, based on comparison CT, this is presumably within intrathoracic stomach secondary to large hernia. There is decreased gastric distension compared to the scout images from the CT. Electronically Signed   By: Donavan Foil M.D.   On: 01/01/2021 16:11    Procedures Procedures (including critical care time)  Medications Ordered in ED Medications  0.9 %  sodium chloride infusion ( Intravenous New Bag/Given 01/01/21 1532)  enoxaparin (LOVENOX) injection 40 mg (has no administration in time range)  HYDROmorphone (DILAUDID) injection 1 mg (has no administration in time range)  ondansetron (ZOFRAN-ODT) disintegrating  tablet 4 mg (has no administration in time range)    Or  ondansetron (ZOFRAN) injection 4 mg (has no administration in time range)  metoprolol tartrate (LOPRESSOR) injection 5 mg (has no administration in time range)  promethazine (PHENERGAN) injection 12.5 mg (12.5 mg Intravenous Given 01/01/21 1404)  sodium chloride 0.9 % bolus 1,000 mL (0 mLs Intravenous Stopped 01/01/21 1545)  iohexol (OMNIPAQUE) 300 MG/ML solution 100 mL (100 mLs Intravenous Contrast Given 01/01/21 1408)  morphine 4 MG/ML injection 4 mg (4 mg Intravenous Given 01/01/21 1526)  promethazine (PHENERGAN) injection 12.5 mg (12.5 mg Intravenous Given 01/01/21 1544)    ED Course  I have reviewed the triage vital signs and the nursing notes.  Pertinent labs & imaging results that were available during my care of the patient were reviewed by me and considered in my medical decision making (see chart for details).  This patient presents to the Emergency Department with complaint of abdominal pain and vomiting. This involves an extensive number of treatment options, and is a complaint that carries with it a high risk of complications and morbidity.  This is most likely related to his hiatal hernia and may be on obstruction - cannot exclude more distal bowel obstruction either at this time  Also on  ddx is a colitis/diverticulitis/pancreatitis or other abdominal process  I ordered, reviewed, and interpreted labs, including BMP and CBC.  WBC 14.  CMP largely unremarkable.  UA with ketones and protein.  Lipase wnl.   I ordered medication abdominal pain and/or nausea I ordered imaging studies which included CT chest /abdomen/pelvis I independently visualized and interpreted imaging which showed large supra-diaphragmatic hiatal hernia and the monitor tracing which showed sinus tachycardia Previous records obtained and reviewed showing prior CT abdomen  CT reviewed, discussed with General surgery as noted below  Clinical Course as of 01/01/21 1651  Mon Jan 01, 2021  1443 Paged dr Brantley Stage for Gen Surgery [MT]  1535 After discussion with Dr Brantley Stage, plan to admit to [facility] non-tele bed, admission placed, under Gen Surgery service.  He advised NG tube which we will place now.  Pt updated with plan and in agreement.  Morphine given for comfort for NG placement.  He otherwise has no significant chest discomfort.  Bp has been stable here.   [MT]    Clinical Course User Index [MT] Chenell Lozon, Carola Rhine, MD    Final Clinical Impression(s) / ED Diagnoses Final diagnoses:  Nausea & vomiting  Hiatal hernia  Gastric outlet obstruction    Rx / DC Orders ED Discharge Orders    None       Wyvonnia Dusky, MD 01/01/21 1652

## 2021-01-01 NOTE — ED Notes (Signed)
Patient transported to CT 

## 2021-01-01 NOTE — H&P (Signed)
Darrell Hoffman. is an 70 y.o. male.   Chief Complaint: n/v HPI: 81 yom who has known type IV hiatal hernia seen by Dr Rosendo Gros in October. Was undergoing evaluation and needed cardiology eval which he completed and was due to robotic [place] repair 2 February. He has had difficulty with bms past several days and using suppositories.  For past 24 hours he has not been able to tolerate anything oral and has been vomiting.  He went to [facility] and had imaging that shows stomach in right chest. Does not appear to have a complication from [place].  He had ng placed there which he states had output initially and feels better.  He was transferred to [facility] .  Past Medical History:  Diagnosis Date  . ALCOHOL ABUSE, HX OF 10/03/2007  . ALLERGIC RHINITIS 10/03/2007  . ANEMIA-B12 DEFICIENCY 12/04/2009  . ANEMIA-IRON DEFICIENCY 11/27/2009  . ANEMIA-NOS 10/08/2007  . ASTHMA 10/03/2007  . Asthma   . B12 DEFICIENCY 10/06/2008  . CKD (chronic kidney disease) stage 3, GFR 30-59 ml/min (HCC) 03/31/2018  . COLONIC POLYPS, HX OF 10/03/2007  . ERECTILE DYSFUNCTION 10/03/2007  . GERD 10/03/2007  . History of hiatal hernia    found on CXT in June 2021  . HYPERLIPIDEMIA 10/03/2007  . HYPERTENSION 10/03/2007  . KNEE PAIN, RIGHT 10/08/2007  . Morbid obesity ([place]) 10/03/2007  . Neuromuscular disorder ([place])    per pt. never diagnosed  . Substance abuse ([place])    marijuana daily    Past Surgical History:  Procedure Laterality Date  . bilateral hydrocele repair    . COLONOSCOPY  06/06/2016  . POLYPECTOMY  01-03-2010  . s/p left knee arthroscopy  2009  . s/p TKR  03/2009  . VASECTOMY      Family History  Problem Relation Age of Onset  . Breast cancer Mother   . Lupus Sister   . Hypertension Sister   . Diabetes Sister   . Alcohol abuse Paternal Uncle   . Coronary artery disease Other        male 1st degree relative - MI at 69 yo  . Diabetes Cousin        2 cousins  . Colon cancer Neg Hx   . Esophageal cancer Neg  Hx   . Rectal cancer Neg Hx   . Stomach cancer Neg Hx   . Colon polyps Neg Hx    Social History:  reports that he has been smoking. He has never used smokeless tobacco. He reports previous alcohol use. He reports current drug use. Drug: Marijuana.  Allergies: No Known Allergies  Medications Prior to Admission  Medication Sig Dispense Refill  . albuterol (PROVENTIL HFA) 108 (90 Base) MCG/ACT inhaler Inhale 2 puffs into the lungs every 4 (four) hours as needed. (Patient taking differently: Inhale 2 puffs into the lungs every 4 (four) hours as needed for wheezing or shortness of breath.) 18 g 2  . glycerin adult 2 g suppository Place 1 suppository rectally daily as needed for constipation.    . metoCLOPramide (REGLAN) 5 MG tablet Take 1 tablet (5 mg total) by mouth 4 (four) times daily -  before meals and at bedtime. (Patient taking differently: Take 5 mg by mouth in the morning, at noon, in the evening, and at bedtime.) 120 tablet 1  . omeprazole (PRILOSEC) 40 MG capsule Take 1 capsule (40 mg total) by mouth in the morning and at bedtime. 60 capsule 2  . vitamin B-12 (CYANOCOBALAMIN) 1000 MCG tablet  Take 1 tablet (1,000 mcg total) by mouth daily. 90 tablet 3  . WIXELA INHUB 500-50 MCG/DOSE AEPB INHALE 1 PUFF INTO THE LUNGS TWICE DAILY (Patient taking differently: Inhale 1 puff into the lungs 2 (two) times daily.) 60 each 11  . ASPIRIN LOW DOSE 81 MG EC tablet TAKE 1 TABLET(81 MG) BY MOUTH DAILY. SWALLOW WHOLE (Patient not taking: Reported on 01/01/2021) 90 tablet 2  . CIALIS 20 MG tablet TAKE 1 TABLET BY MOUTH DAILY AS NEEDED (Patient not taking: Reported on 01/01/2021) 5 tablet 11  . fluticasone (FLONASE) 50 MCG/ACT nasal spray Place 2 sprays into both nostrils daily. (Patient not taking: Reported on 01/01/2021) 16 g 5    Results for orders placed or performed during the hospital encounter of 01/01/21 (from the past 48 hour(s))  Lipase, blood     Status: None   Collection Time: 01/01/21 12:40 PM   Result Value Ref Range   Lipase 17 11 - 51 U/L    Comment: Performed at [facility], [address] [place], [place] [address]  Comprehensive metabolic panel     Status: Abnormal   Collection Time: 01/01/21 12:40 PM  Result Value Ref Range   Sodium 143 135 - 145 mmol/L   Potassium 4.0 3.5 - 5.1 mmol/L   Chloride 100 98 - 111 mmol/L   CO2 27 22 - 32 mmol/L   Glucose, Bld 133 (H) 70 - 99 mg/dL    Comment: Glucose reference range applies only to samples taken after fasting for at least 8 hours.   BUN 18 8 - 23 mg/dL   Creatinine, Ser 1.20 0.61 - 1.24 mg/dL   Calcium 10.1 8.9 - 10.3 mg/dL   Total Protein 7.8 6.5 - 8.1 g/dL   Albumin 4.7 3.5 - 5.0 g/dL   AST 24 15 - 41 U/L   ALT 17 0 - 44 U/L   Alkaline Phosphatase 36 (L) 38 - 126 U/L   Total Bilirubin 1.0 0.3 - 1.2 mg/dL   GFR, Estimated >60 >60 mL/min    Comment: (NOTE) Calculated using the CKD-EPI Creatinine Equation (2021)    Anion gap 16 (H) 5 - 15    Comment: Performed at [facility], [address] [place], [place] [address]  CBC     Status: Abnormal   Collection Time: 01/01/21 12:40 PM  Result Value Ref Range   WBC 14.0 (H) 4.0 - 10.5 K/uL   RBC 4.51 4.22 - 5.81 MIL/uL   Hemoglobin 12.5 (L) 13.0 - 17.0 g/dL   HCT 38.1 (L) 39.0 - 52.0 %   MCV 84.5 80.0 - 100.0 fL   MCH 27.7 26.0 - 34.0 pg   MCHC 32.8 30.0 - 36.0 g/dL   RDW 13.7 11.5 - 15.5 %   Platelets 224 150 - 400 K/uL   nRBC 0.0 0.0 - 0.2 %    Comment: Performed at [facility] [address] [place], [place] [address]  Urinalysis, Routine w reflex microscopic Urine, Clean Catch     Status: Abnormal   Collection Time: 01/01/21  1:00 PM  Result Value Ref Range   Color, Urine YELLOW YELLOW   APPearance CLEAR CLEAR   Specific Gravity, Urine 1.030 1.005 - 1.030   pH 6.0 5.0 - 8.0   Glucose, UA NEGATIVE NEGATIVE mg/dL   Hgb urine dipstick NEGATIVE NEGATIVE   Bilirubin Urine NEGATIVE NEGATIVE   Ketones, ur 40 (A) NEGATIVE  mg/dL   Protein,  ur 30 (A) NEGATIVE mg/dL   Nitrite NEGATIVE NEGATIVE   Leukocytes,Ua NEGATIVE NEGATIVE    Comment: Performed at [facility], [address] [place], [place] [address]  Urinalysis, Microscopic (reflex)     Status: Abnormal   Collection Time: 01/01/21  1:00 PM  Result Value Ref Range   RBC / HPF 0-5 0 - 5 RBC/hpf   WBC, UA 0-5 0 - 5 WBC/hpf   Bacteria, UA FEW (A) NONE SEEN   Squamous Epithelial / LPF 0-5 0 - 5   Mucus PRESENT     Comment: Performed at [facility], [address] [place], [place] [address]  Resp Panel by RT-PCR (Flu A&B, Covid) Nasopharyngeal Swab     Status: None   Collection Time: 01/01/21  6:26 PM   Specimen: Nasopharyngeal Swab; Nasopharyngeal(NP) swabs in vial transport medium  Result Value Ref Range   SARS Coronavirus 2 by RT PCR NEGATIVE NEGATIVE    Comment: (NOTE) SARS-CoV-2 target nucleic acids are NOT DETECTED.  The SARS-CoV-2 RNA is generally detectable in upper respiratory specimens during the acute phase of infection. The lowest concentration of SARS-CoV-2 viral copies this assay can detect is 138 copies/mL. A negative result does not preclude SARS-Cov-2 infection and should not be used as the sole basis for treatment or other patient management decisions. A negative result may occur with  improper specimen collection/handling, submission of specimen other than nasopharyngeal swab, presence of viral mutation(s) within the areas targeted by this assay, and inadequate number of viral copies(<138 copies/mL). A negative result must be combined with clinical observations, patient history, and epidemiological information. The expected result is Negative.  Fact Sheet for Patients:  EntrepreneurPulse.com.au  Fact Sheet for Healthcare Providers:  IncredibleEmployment.be  This test is no t yet approved or cleared by the [place] FDA and  has been authorized for detection  and/or diagnosis of SARS-CoV-2 by FDA under an Emergency Use Authorization (EUA). This EUA will remain  in effect (meaning this test can be used) for the duration of the COVID-19 declaration under Section 564(b)(1) of the Act, 21 U.S.C.section 360bbb-3(b)(1), unless the authorization is terminated  or revoked sooner.       Influenza A by PCR NEGATIVE NEGATIVE   Influenza B by PCR NEGATIVE NEGATIVE    Comment: (NOTE) The Xpert Xpress SARS-CoV-2/FLU/RSV plus assay is intended as an aid in the diagnosis of influenza from Nasopharyngeal swab specimens and should not be used as a sole basis for treatment. Nasal washings and aspirates are unacceptable for Xpert Xpress SARS-CoV-2/FLU/RSV testing.  Fact Sheet for Patients: EntrepreneurPulse.com.au  Fact Sheet for Healthcare Providers: IncredibleEmployment.be  This test is not yet approved or cleared by the [place] FDA and has been authorized for detection and/or diagnosis of SARS-CoV-2 by FDA under an Emergency Use Authorization (EUA). This EUA will remain in effect (meaning this test can be used) for the duration of the COVID-19 declaration under Section 564(b)(1) of the Act, 21 U.S.C. section 360bbb-3(b)(1), unless the authorization is terminated or revoked.  Performed at [facility], [address] [place], [place] [address]    CT CHEST ABDOMEN PELVIS W CONTRAST  Result Date: 01/01/2021 CLINICAL DATA:  Vomiting since yesterday. Known diaphragmatic hernia with surgery scheduled in 2 weeks. EXAM: CT CHEST, ABDOMEN, AND PELVIS WITH CONTRAST TECHNIQUE: Multidetector CT imaging of the chest, abdomen and pelvis was performed following the standard protocol during bolus administration of intravenous contrast. CONTRAST:  167mL OMNIPAQUE  IOHEXOL 300 MG/ML  SOLN COMPARISON:  Abdominopelvic CT 06/22/2020. Upper GI series 09/04/2020. FINDINGS: CT CHEST FINDINGS Cardiovascular: Atherosclerosis  of the aorta, great vessels and coronary arteries. No acute vascular findings. The heart size is normal. There is no pericardial effusion. Mediastinum/Nodes: There are no enlarged mediastinal, hilar or axillary lymph nodes. Again noted is a large hiatal hernia containing all of the stomach, herniated into the posterior right hemithorax. Compared with the prior CT, the intrathoracic stomach is significantly distended and fluid-filled, with mass effect on the heart which is displaced anteriorly. Lungs/Pleura: There is no pleural effusion or pneumothorax. Increased compressive atelectasis within the right lower and middle lobes by the enlarging hiatal hernia. Mild atelectasis medially in the left lower lobe. Mild underlying centrilobular emphysema. Musculoskeletal/Chest wall: No chest wall mass or suspicious osseous findings. Stable degenerative changes in the spine, shoulders and sternoclavicular joints. CT ABDOMEN AND PELVIS FINDINGS Hepatobiliary: The liver is normal in density without suspicious focal abnormality. No evidence of gallstones, gallbladder wall thickening or biliary dilatation. Pancreas: Unremarkable. No pancreatic ductal dilatation or surrounding inflammatory changes. Spleen: Normal in size without focal abnormality. Adrenals/Urinary Tract: Both adrenal glands appear normal. Stable central left parapelvic renal cyst. No hydronephrosis or urinary tract calculus. The bladder appears unremarkable. Stomach/Bowel: No enteric contrast was administered. As above, the entire stomach is herniated into the lower right chest. There is progressive marked fluid distension of the stomach. The configuration of the antrum and duodenum is unchanged, without twisting to suggest a volvulus. No evidence of bowel wall thickening, distention or surrounding inflammation. The appendix appears normal. Vascular/Lymphatic: There are no enlarged abdominal or pelvic lymph nodes. No acute vascular findings. There is mild aortic  and branch vessel atherosclerosis. The distended stomach exerts mass effect on the inferior cavoatrial junction. The portal, superior mesenteric and splenic veins are patent. Reproductive: Stable mild enlargement of the prostate gland. The seminal vesicles appear normal. Other: Stable small supraumbilical hernia containing only fat. No herniated bowel. No ascites or free air. Musculoskeletal: No acute or significant osseous findings. Lumbar spondylosis noted. IMPRESSION: 1. Marked progressive fluid distension of the intrathoracic stomach in this patient with a known large hiatal hernia suggesting gastric outlet obstruction, etiology not demonstrated. No gastric volvulus identified. The entire stomach is herniated into the posterior right chest. Patient may benefit from nasogastric tube placement. 2. Increased compressive atelectasis in the right lower and middle lobes by the enlarging hiatal hernia. 3. No other acute findings. 4. Aortic Atherosclerosis (ICD10-I70.0) and Emphysema (ICD10-J43.9). Electronically Signed   By: Carey Bullocks M.D.   On: 01/01/2021 14:37   DG Chest Portable 1 View  Result Date: 01/01/2021 CLINICAL DATA:  NG tube placement EXAM: PORTABLE CHEST 1 VIEW COMPARISON:  CT 01/01/2021, fluoroscopic examination 09/04/2020 FINDINGS: Subsegmental atelectasis at the right base. Esophageal tube tip overlies the right lower chest, based on comparison CT and fluoroscopic exam, this is presumably within intrathoracic stomach secondary to large hernia. Gastric distension appears decreased compared to scout image from the CT. There is excreted contrast within the kidneys. IMPRESSION: Esophageal tube tip overlies the right lower chest, based on comparison CT, this is presumably within intrathoracic stomach secondary to large hernia. There is decreased gastric distension compared to the scout images from the CT. Electronically Signed   By: Jasmine Pang M.D.   On: 01/01/2021 16:11    Review of Systems   Constitutional: Positive for appetite change. Negative for fever.  Respiratory: Negative for shortness of breath.   Cardiovascular: Negative for  chest pain.  Gastrointestinal: Positive for nausea and vomiting. Negative for abdominal pain.  All other systems reviewed and are negative.   Blood pressure (!) 147/90, pulse 91, temperature 98.6 F (37 C), temperature source Oral, resp. rate 18, height 5\' 6"  (1.676 m), weight 67.1 kg, SpO2 98 %. Physical Exam Constitutional:      Appearance: Normal appearance.  HENT:     Head: Normocephalic and atraumatic.     Right Ear: External ear normal.     Left Ear: External ear normal.     Nose: Nose normal.     Mouth/Throat:     Mouth: Mucous membranes are moist.  Eyes:     General: No scleral icterus.    Extraocular Movements: Extraocular movements intact.     Conjunctiva/sclera: Conjunctivae normal.  Cardiovascular:     Rate and Rhythm: Normal rate and regular rhythm.     Heart sounds: Normal heart sounds.  Pulmonary:     Breath sounds: Normal breath sounds.  Abdominal:     General: There is no distension.     Palpations: Abdomen is soft.     Tenderness: There is no abdominal tenderness.     Hernia: No hernia is present.  Musculoskeletal:     Cervical back: Neck supple.  Lymphadenopathy:     Cervical: No cervical adenopathy.  Skin:    General: Skin is warm and dry.     Capillary Refill: Capillary refill takes less than 2 seconds.  Neurological:     General: No focal deficit present.     Mental Status: He is alert.  Psychiatric:        Mood and Affect: Mood normal.        Behavior: Behavior normal.      Assessment/Plan PEH -no indication for surgery tonight -ng in place -reassess in am with goal of being to treat this conservatively have him go home on liquids/shakes if tolerates that to undergo repair in February -will check prealbumin tomorrow due to wt loss -understands if not improving may just need repair while  here -lovenox, scds   Rolm Bookbinder, MD 01/01/2021, 9:07 PM

## 2021-01-01 NOTE — ED Triage Notes (Signed)
Pt to triage via ems ambulatory statinh he is to have surgery on his hernia feb 2 , states he has been vomiting since yesterday has meds but could not take them due to vomiting

## 2021-01-02 DIAGNOSIS — K449 Diaphragmatic hernia without obstruction or gangrene: Secondary | ICD-10-CM | POA: Diagnosis not present

## 2021-01-02 LAB — COMPREHENSIVE METABOLIC PANEL
ALT: 14 U/L (ref 0–44)
AST: 17 U/L (ref 15–41)
Albumin: 3.6 g/dL (ref 3.5–5.0)
Alkaline Phosphatase: 28 U/L — ABNORMAL LOW (ref 38–126)
Anion gap: 11 (ref 5–15)
BUN: 16 mg/dL (ref 8–23)
CO2: 29 mmol/L (ref 22–32)
Calcium: 8.9 mg/dL (ref 8.9–10.3)
Chloride: 105 mmol/L (ref 98–111)
Creatinine, Ser: 1.09 mg/dL (ref 0.61–1.24)
GFR, Estimated: >60 mL/min (ref >60)
Glucose, Bld: 115 mg/dL — ABNORMAL HIGH (ref 70–99)
Potassium: 3.7 mmol/L (ref 3.5–5.1)
Sodium: 145 mmol/L (ref 135–145)
Total Bilirubin: 0.7 mg/dL (ref 0.3–1.2)
Total Protein: 6.1 g/dL — ABNORMAL LOW (ref 6.5–8.1)

## 2021-01-02 LAB — CBC
HCT: 33 % — ABNORMAL LOW (ref 39.0–52.0)
Hemoglobin: 10.6 g/dL — ABNORMAL LOW (ref 13.0–17.0)
MCH: 27.7 pg (ref 26.0–34.0)
MCHC: 32.1 g/dL (ref 30.0–36.0)
MCV: 86.4 fL (ref 80.0–100.0)
Platelets: 226 10*3/uL (ref 150–400)
RBC: 3.82 MIL/uL — ABNORMAL LOW (ref 4.22–5.81)
RDW: 14.1 % (ref 11.5–15.5)
WBC: 10 10*3/uL (ref 4.0–10.5)
nRBC: 0 % (ref 0.0–0.2)

## 2021-01-02 LAB — PREALBUMIN: Prealbumin: 17.2 mg/dL — ABNORMAL LOW (ref 18–38)

## 2021-01-02 MED ORDER — MENTHOL 3 MG MT LOZG
1.0000 | LOZENGE | OROMUCOSAL | Status: DC | PRN
  Filled 2021-01-02: qty 9

## 2021-01-02 MED ORDER — PHENOL 1.4 % MT LIQD
1.0000 | OROMUCOSAL | Status: DC | PRN
  Filled 2021-01-02: qty 177

## 2021-01-02 NOTE — Progress Notes (Signed)
Subjective: CC: Patient with 1.7L out from NGT since placement. He reports his nausea, abdominal pain and distension has resolved. No BM or flatus since admission.   Objective: Vital signs in last 24 hours: Temp:  [98.1 F (36.7 C)-98.6 F (37 C)] 98.2 F (36.8 C) (01/18 0533) Pulse Rate:  [84-113] 84 (01/18 0533) Resp:  [16-19] 16 (01/18 0533) BP: (121-167)/(81-104) 131/87 (01/18 0533) SpO2:  [95 %-100 %] 100 % (01/18 0533) Weight:  [67.1 kg] 67.1 kg (01/17 1141) Last BM Date: 01/01/21  Intake/Output from previous day: 01/17 0701 - 01/18 0700 In: 1964.9 [P.O.:60; I.V.:904.9; IV Piggyback:1000] Out: 1775 [Emesis/NG output:1775] Intake/Output this shift: No intake/output data recorded.  PE: Gen:  Alert, NAD, pleasant HEENT: EOM's intact, pupils equal and round Card:  RRR, no M/G/R heard Pulm:  CTAB, no W/R/R, effort normal Abd: Soft, NT/ND, hypoactive BS, NGT in place with bilious output in cannister.  Ext:  No erythema, edema, or tenderness BUE/BLE  Psych: A&Ox3  Skin: no rashes noted, warm and dry   Lab Results:  Recent Labs    01/01/21 1240 01/02/21 0625  WBC 14.0* 10.0  HGB 12.5* 10.6*  HCT 38.1* 33.0*  PLT 224 226   BMET Recent Labs    01/01/21 1240 01/02/21 0625  NA 143 145  K 4.0 3.7  CL 100 105  CO2 27 29  GLUCOSE 133* 115*  BUN 18 16  CREATININE 1.20 1.09  CALCIUM 10.1 8.9   PT/INR No results for input(s): LABPROT, INR in the last 72 hours. CMP     Component Value Date/Time   NA 145 01/02/2021 0625   K 3.7 01/02/2021 0625   CL 105 01/02/2021 0625   CO2 29 01/02/2021 0625   GLUCOSE 115 (H) 01/02/2021 0625   BUN 16 01/02/2021 0625   CREATININE 1.09 01/02/2021 0625   CREATININE 2.28 (H) 08/09/2020 1149   CALCIUM 8.9 01/02/2021 0625   PROT 6.1 (L) 01/02/2021 0625   ALBUMIN 3.6 01/02/2021 0625   AST 17 01/02/2021 0625   ALT 14 01/02/2021 0625   ALKPHOS 28 (L) 01/02/2021 0625   BILITOT 0.7 01/02/2021 0625   GFRNONAA >60  01/02/2021 0625   GFRNONAA 28 (L) 08/09/2020 1149   GFRAA 33 (L) 08/09/2020 1149   Lipase     Component Value Date/Time   LIPASE 17 01/01/2021 1240       Studies/Results: CT CHEST ABDOMEN PELVIS W CONTRAST  Result Date: 01/01/2021 CLINICAL DATA:  Vomiting since yesterday. Known diaphragmatic hernia with surgery scheduled in 2 weeks. EXAM: CT CHEST, ABDOMEN, AND PELVIS WITH CONTRAST TECHNIQUE: Multidetector CT imaging of the chest, abdomen and pelvis was performed following the standard protocol during bolus administration of intravenous contrast. CONTRAST:  146mL OMNIPAQUE IOHEXOL 300 MG/ML  SOLN COMPARISON:  Abdominopelvic CT 06/22/2020. Upper GI series 09/04/2020. FINDINGS: CT CHEST FINDINGS Cardiovascular: Atherosclerosis of the aorta, great vessels and coronary arteries. No acute vascular findings. The heart size is normal. There is no pericardial effusion. Mediastinum/Nodes: There are no enlarged mediastinal, hilar or axillary lymph nodes. Again noted is a large hiatal hernia containing all of the stomach, herniated into the posterior right hemithorax. Compared with the prior CT, the intrathoracic stomach is significantly distended and fluid-filled, with mass effect on the heart which is displaced anteriorly. Lungs/Pleura: There is no pleural effusion or pneumothorax. Increased compressive atelectasis within the right lower and middle lobes by the enlarging hiatal hernia. Mild atelectasis medially in the left lower lobe. Mild  underlying centrilobular emphysema. Musculoskeletal/Chest wall: No chest wall mass or suspicious osseous findings. Stable degenerative changes in the spine, shoulders and sternoclavicular joints. CT ABDOMEN AND PELVIS FINDINGS Hepatobiliary: The liver is normal in density without suspicious focal abnormality. No evidence of gallstones, gallbladder wall thickening or biliary dilatation. Pancreas: Unremarkable. No pancreatic ductal dilatation or surrounding inflammatory  changes. Spleen: Normal in size without focal abnormality. Adrenals/Urinary Tract: Both adrenal glands appear normal. Stable central left parapelvic renal cyst. No hydronephrosis or urinary tract calculus. The bladder appears unremarkable. Stomach/Bowel: No enteric contrast was administered. As above, the entire stomach is herniated into the lower right chest. There is progressive marked fluid distension of the stomach. The configuration of the antrum and duodenum is unchanged, without twisting to suggest a volvulus. No evidence of bowel wall thickening, distention or surrounding inflammation. The appendix appears normal. Vascular/Lymphatic: There are no enlarged abdominal or pelvic lymph nodes. No acute vascular findings. There is mild aortic and branch vessel atherosclerosis. The distended stomach exerts mass effect on the inferior cavoatrial junction. The portal, superior mesenteric and splenic veins are patent. Reproductive: Stable mild enlargement of the prostate gland. The seminal vesicles appear normal. Other: Stable small supraumbilical hernia containing only fat. No herniated bowel. No ascites or free air. Musculoskeletal: No acute or significant osseous findings. Lumbar spondylosis noted. IMPRESSION: 1. Marked progressive fluid distension of the intrathoracic stomach in this patient with a known large hiatal hernia suggesting gastric outlet obstruction, etiology not demonstrated. No gastric volvulus identified. The entire stomach is herniated into the posterior right chest. Patient may benefit from nasogastric tube placement. 2. Increased compressive atelectasis in the right lower and middle lobes by the enlarging hiatal hernia. 3. No other acute findings. 4. Aortic Atherosclerosis (ICD10-I70.0) and Emphysema (ICD10-J43.9). Electronically Signed   By: Richardean Sale M.D.   On: 01/01/2021 14:37   DG Chest Portable 1 View  Result Date: 01/01/2021 CLINICAL DATA:  NG tube placement EXAM: PORTABLE CHEST 1  VIEW COMPARISON:  CT 01/01/2021, fluoroscopic examination 09/04/2020 FINDINGS: Subsegmental atelectasis at the right base. Esophageal tube tip overlies the right lower chest, based on comparison CT and fluoroscopic exam, this is presumably within intrathoracic stomach secondary to large hernia. Gastric distension appears decreased compared to scout image from the CT. There is excreted contrast within the kidneys. IMPRESSION: Esophageal tube tip overlies the right lower chest, based on comparison CT, this is presumably within intrathoracic stomach secondary to large hernia. There is decreased gastric distension compared to the scout images from the CT. Electronically Signed   By: Donavan Foil M.D.   On: 01/01/2021 16:11    Anti-infectives: Anti-infectives (From admission, onward)   None       Assessment/Plan HTN - not on any meds for this at home. PRN metoprolol HLD GERD Hx of CKD stage 3 Hx of Asthma  Protein Calorie Malnutrition - Pre-alb 17.2  Hiatal hernia w/ possible GOO - s/p NGT decompression with improvement of symptoms - Patient is currently scheduled for robotic hernia repair with mesh and toupee fundoplication with Dr. Rosendo Gros on 01/17/2021.  - Continue NGT decompression and await ROBF - We discussed if patient was to improve, options of proceeding with surgery here which would likely be an open surgery vs proceeding with less invasive surgery with Dr. Rosendo Gros as scheduled above. Patient would like to think on this but is leaning towards getting this done while he is here - Mobilize - Pulm toilet  FEN - NPO, NGT, IVF VTE - SCDs, Lovenox  ID - None Foley - None  Follow-Up - TBD    LOS: 1 day    Jillyn Ledger , [facility] 01/02/2021, 10:21 AM Please see Amion for pager number during day hours 7:00am-4:30pm

## 2021-01-02 NOTE — Progress Notes (Signed)
Initial Nutrition Assessment  DOCUMENTATION CODES:   Non-severe (moderate) malnutrition in context of acute illness/injury  INTERVENTION:   -Will monitor for diet advancement  Once diet advanced: -Ensure Surgery po TID, each supplement provides 350 kcal and 20 grams of protein  NUTRITION DIAGNOSIS:   Moderate Malnutrition related to acute illness (hiatal hernia) as evidenced by percent weight loss,moderate muscle depletion.  GOAL:   Patient will meet greater than or equal to 90% of their needs  MONITOR:   Labs,Weight trends,I & O's,Diet advancement  REASON FOR ASSESSMENT:   Malnutrition Screening Tool    ASSESSMENT:   70 y.o. male w/ hx of large hiatal hernia pending surgical repair on 01/17/2021 here with n/v.  Patient reports that he has had epigastric pain, persistent nausea and vomiting for at least 24 hours.  He said he has been having difficulty with bowel movements for the past several days, and had a very small bowel movement to help of a suppository several days ago.  Patient has been having N/V which lasted 1 day PTA, has been unable to tolerate any PO. Prior to these symptoms, pt states he was trying to drink 2-3 Ensures a day in preparation of his surgery in February. Pt has been able to eat ground meat, tuna sandwiches, chicken and pasta.   Pt with large hiatal hernia. Per surgery note, pt scheduled for surgery in February. May need surgery this admission if does not improve. Currently NPO.  Recommend Ensure supplements once diet is advanced. Will monitor for plan.  Pt reports losing >100 lbs over the past year. UBW is ~215 lbs. Pt states he was not weighed at admission, feels like the recorded weight is not accurate. Will order new weight.   Per weight records, pt has lost 68 lbs since 07/21/19. Pt lost 32 lbs since June 2021 (17% wt loss x 7 months, significant for time frame).  I/Os: +189 ml since admit  NGT: 1775 ml x 24 hrs  Labs reviewed. Medications:  D5 infusion  NUTRITION - FOCUSED PHYSICAL EXAM:  Flowsheet Row Most Recent Value  Orbital Region No depletion  Upper Arm Region Moderate depletion  Thoracic and Lumbar Region Unable to assess  Buccal Region No depletion  Temple Region Mild depletion  Clavicle Bone Region Moderate depletion  Clavicle and Acromion Bone Region Moderate depletion  Scapular Bone Region Unable to assess  Dorsal Hand No depletion  Patellar Region Unable to assess  Anterior Thigh Region Unable to assess  Posterior Calf Region Unable to assess  Edema (RD Assessment) None       Diet Order:   Diet Order            Diet NPO time specified Except for: Ice Chips  Diet effective now                 EDUCATION NEEDS:   No education needs have been identified at this time  Skin:  Skin Assessment: Reviewed RN Assessment  Last BM:  1/17  Height:   Ht Readings from Last 1 Encounters:  01/01/21 5\' 6"  (1.676 m)    Weight:   Wt Readings from Last 1 Encounters:  01/01/21 67.1 kg   BMI:  Body mass index is 23.89 kg/m.  Estimated Nutritional Needs:   Kcal:  1700-1900  Protein:  80-95g  Fluid:  1.9L/day  Clayton Bibles, MS, RD, LDN Inpatient Clinical Dietitian Contact information available via Amion

## 2021-01-03 ENCOUNTER — Encounter (HOSPITAL_COMMUNITY): Payer: Self-pay

## 2021-01-03 DIAGNOSIS — K449 Diaphragmatic hernia without obstruction or gangrene: Secondary | ICD-10-CM | POA: Diagnosis not present

## 2021-01-03 LAB — SURGICAL PCR SCREEN
MRSA, PCR: NEGATIVE
Staphylococcus aureus: POSITIVE — AB

## 2021-01-03 MED ORDER — ACETAMINOPHEN 325 MG PO TABS: 650.0000 mg | ORAL_TABLET | Freq: Four times a day (QID) | ORAL | Status: DC | PRN

## 2021-01-03 MED ORDER — CEFAZOLIN SODIUM-DEXTROSE 2-4 GM/100ML-% IV SOLN
2.0000 g | INTRAVENOUS | Status: AC
  Administered 2021-01-04: 2 g via INTRAVENOUS
  Filled 2021-01-03: qty 100

## 2021-01-03 MED ORDER — MUPIROCIN 2 % EX OINT
1.0000 "application " | TOPICAL_OINTMENT | Freq: Two times a day (BID) | CUTANEOUS | Status: DC
  Administered 2021-01-04 – 2021-01-05 (×3): 1 via NASAL
  Filled 2021-01-03: qty 22

## 2021-01-03 MED ORDER — CHLORHEXIDINE GLUCONATE CLOTH 2 % EX PADS
6.0000 | MEDICATED_PAD | Freq: Every day | CUTANEOUS | Status: DC
  Administered 2021-01-04 – 2021-01-05 (×2): 6 via TOPICAL

## 2021-01-03 NOTE — Progress Notes (Signed)
Subjective: CC: Doing well. No nausea or abdominal pain. He began passing flatus this am. He has not gotten oob to ambulate. Voiding without difficulty.    Objective: Vital signs in last 24 hours: Temp:  [97.7 F (36.5 C)-99 F (37.2 C)] 98.3 F (36.8 C) (01/19 0621) Pulse Rate:  [70-85] 70 (01/19 0621) Resp:  [15-16] 16 (01/19 0621) BP: (133-146)/(78-95) 133/78 (01/19 0621) SpO2:  [98 %-100 %] 100 % (01/19 0621) Weight:  [62 kg] 62 kg (01/18 1500) Last BM Date: 01/01/21  Intake/Output from previous day: 01/18 0701 - 01/19 0700 In: 3100.3 [P.O.:270; I.V.:2830.3] Out: 2430 [Urine:730; Emesis/NG output:1700] Intake/Output this shift: Total I/O In: -  Out: 350 [Urine:50; Emesis/NG output:300]  PE: Gen:  Alert, NAD, pleasant HEENT: EOM's intact, pupils equal and round Card:  RRR Pulm:  CTAB, no W/R/R, effort normal Abd: Soft, NT/ND, + BS, NGT in place with bilious output in cannister. 1.7L/24 hours from NGT. Ext:  No LE edema or calf tenderness Psych: A&Ox3  Skin: no rashes noted, warm and dry  Lab Results:  Recent Labs    01/01/21 1240 01/02/21 0625  WBC 14.0* 10.0  HGB 12.5* 10.6*  HCT 38.1* 33.0*  PLT 224 226   BMET Recent Labs    01/01/21 1240 01/02/21 0625  NA 143 145  K 4.0 3.7  CL 100 105  CO2 27 29  GLUCOSE 133* 115*  BUN 18 16  CREATININE 1.20 1.09  CALCIUM 10.1 8.9   PT/INR No results for input(s): LABPROT, INR in the last 72 hours. CMP     Component Value Date/Time   NA 145 01/02/2021 0625   K 3.7 01/02/2021 0625   CL 105 01/02/2021 0625   CO2 29 01/02/2021 0625   GLUCOSE 115 (H) 01/02/2021 0625   BUN 16 01/02/2021 0625   CREATININE 1.09 01/02/2021 0625   CREATININE 2.28 (H) 08/09/2020 1149   CALCIUM 8.9 01/02/2021 0625   PROT 6.1 (L) 01/02/2021 0625   ALBUMIN 3.6 01/02/2021 0625   AST 17 01/02/2021 0625   ALT 14 01/02/2021 0625   ALKPHOS 28 (L) 01/02/2021 0625   BILITOT 0.7 01/02/2021 0625   GFRNONAA >60 01/02/2021 0625    GFRNONAA 28 (L) 08/09/2020 1149   GFRAA 33 (L) 08/09/2020 1149   Lipase     Component Value Date/Time   LIPASE 17 01/01/2021 1240       Studies/Results: CT CHEST ABDOMEN PELVIS W CONTRAST  Result Date: 01/01/2021 CLINICAL DATA:  Vomiting since yesterday. Known diaphragmatic hernia with surgery scheduled in 2 weeks. EXAM: CT CHEST, ABDOMEN, AND PELVIS WITH CONTRAST TECHNIQUE: Multidetector CT imaging of the chest, abdomen and pelvis was performed following the standard protocol during bolus administration of intravenous contrast. CONTRAST:  174mL OMNIPAQUE IOHEXOL 300 MG/ML  SOLN COMPARISON:  Abdominopelvic CT 06/22/2020. Upper GI series 09/04/2020. FINDINGS: CT CHEST FINDINGS Cardiovascular: Atherosclerosis of the aorta, great vessels and coronary arteries. No acute vascular findings. The heart size is normal. There is no pericardial effusion. Mediastinum/Nodes: There are no enlarged mediastinal, hilar or axillary lymph nodes. Again noted is a large hiatal hernia containing all of the stomach, herniated into the posterior right hemithorax. Compared with the prior CT, the intrathoracic stomach is significantly distended and fluid-filled, with mass effect on the heart which is displaced anteriorly. Lungs/Pleura: There is no pleural effusion or pneumothorax. Increased compressive atelectasis within the right lower and middle lobes by the enlarging hiatal hernia. Mild atelectasis medially in the left  lower lobe. Mild underlying centrilobular emphysema. Musculoskeletal/Chest wall: No chest wall mass or suspicious osseous findings. Stable degenerative changes in the spine, shoulders and sternoclavicular joints. CT ABDOMEN AND PELVIS FINDINGS Hepatobiliary: The liver is normal in density without suspicious focal abnormality. No evidence of gallstones, gallbladder wall thickening or biliary dilatation. Pancreas: Unremarkable. No pancreatic ductal dilatation or surrounding inflammatory changes. Spleen:  Normal in size without focal abnormality. Adrenals/Urinary Tract: Both adrenal glands appear normal. Stable central left parapelvic renal cyst. No hydronephrosis or urinary tract calculus. The bladder appears unremarkable. Stomach/Bowel: No enteric contrast was administered. As above, the entire stomach is herniated into the lower right chest. There is progressive marked fluid distension of the stomach. The configuration of the antrum and duodenum is unchanged, without twisting to suggest a volvulus. No evidence of bowel wall thickening, distention or surrounding inflammation. The appendix appears normal. Vascular/Lymphatic: There are no enlarged abdominal or pelvic lymph nodes. No acute vascular findings. There is mild aortic and branch vessel atherosclerosis. The distended stomach exerts mass effect on the inferior cavoatrial junction. The portal, superior mesenteric and splenic veins are patent. Reproductive: Stable mild enlargement of the prostate gland. The seminal vesicles appear normal. Other: Stable small supraumbilical hernia containing only fat. No herniated bowel. No ascites or free air. Musculoskeletal: No acute or significant osseous findings. Lumbar spondylosis noted. IMPRESSION: 1. Marked progressive fluid distension of the intrathoracic stomach in this patient with a known large hiatal hernia suggesting gastric outlet obstruction, etiology not demonstrated. No gastric volvulus identified. The entire stomach is herniated into the posterior right chest. Patient may benefit from nasogastric tube placement. 2. Increased compressive atelectasis in the right lower and middle lobes by the enlarging hiatal hernia. 3. No other acute findings. 4. Aortic Atherosclerosis (ICD10-I70.0) and Emphysema (ICD10-J43.9). Electronically Signed   By: Richardean Sale M.D.   On: 01/01/2021 14:37   DG Chest Portable 1 View  Result Date: 01/01/2021 CLINICAL DATA:  NG tube placement EXAM: PORTABLE CHEST 1 VIEW COMPARISON:   CT 01/01/2021, fluoroscopic examination 09/04/2020 FINDINGS: Subsegmental atelectasis at the right base. Esophageal tube tip overlies the right lower chest, based on comparison CT and fluoroscopic exam, this is presumably within intrathoracic stomach secondary to large hernia. Gastric distension appears decreased compared to scout image from the CT. There is excreted contrast within the kidneys. IMPRESSION: Esophageal tube tip overlies the right lower chest, based on comparison CT, this is presumably within intrathoracic stomach secondary to large hernia. There is decreased gastric distension compared to the scout images from the CT. Electronically Signed   By: Donavan Foil M.D.   On: 01/01/2021 16:11    Anti-infectives: Anti-infectives (From admission, onward)   None       Assessment/Plan HTN - not on any meds for this at home. PRN metoprolol HLD GERD Hx of Asthma  Protein Calorie Malnutrition - Pre-alb 17.2  Hiatal hernia w/ possible GOO - S/p NGT decompression with improvement of symptoms - Patient previously scheduled for robotic hernia repair with mesh and toupee fundoplication with Dr. Rosendo Gros on 01/17/2021. Dr. Rosendo Gros is now planning for surgery in the AM.  - Continue NGT decompression until time of surgery - Mobilize. Okay with clamping NGT to mobilize.  - AM labs  - Pulm toilet  FEN - NPO, NGT, IVF VTE - SCDs, Lovenox  ID - None Foley - None    LOS: 2 days    Jillyn Ledger , [facility] 01/03/2021, 10:35 AM Please see Amion for pager  number during day hours 7:00am-4:30pm

## 2021-01-03 NOTE — Progress Notes (Signed)
Had a detailed d/w the pt. We will plan to proceed to OR tomorrow for robotic hhr with mesh and fundoplication I d/w him the risks and benefits of the procedure to include but not limited to: infection, bleeding, damage to surrounding structures, possible pneumothorax, and possible need for further surgery.  Patient voiced understanding and wishes to proceed.

## 2021-01-04 ENCOUNTER — Encounter (HOSPITAL_COMMUNITY): Payer: Self-pay

## 2021-01-04 ENCOUNTER — Encounter (HOSPITAL_COMMUNITY): Admission: EM | Disposition: A | Discharge status: Home / Self Care | Payer: Self-pay | Source: Home / Self Care

## 2021-01-04 ENCOUNTER — Inpatient Hospital Stay: Admit: 2021-01-04 | Payer: Federal, State, Local not specified - PPO | Admitting: General Surgery

## 2021-01-04 ENCOUNTER — Inpatient Hospital Stay (HOSPITAL_COMMUNITY): Payer: Federal, State, Local not specified - PPO | Admitting: Anesthesiology

## 2021-01-04 DIAGNOSIS — K449 Diaphragmatic hernia without obstruction or gangrene: Secondary | ICD-10-CM | POA: Diagnosis not present

## 2021-01-04 DIAGNOSIS — E559 Vitamin D deficiency, unspecified: Secondary | ICD-10-CM | POA: Diagnosis not present

## 2021-01-04 DIAGNOSIS — E785 Hyperlipidemia, unspecified: Secondary | ICD-10-CM | POA: Diagnosis not present

## 2021-01-04 DIAGNOSIS — J45909 Unspecified asthma, uncomplicated: Secondary | ICD-10-CM | POA: Diagnosis not present

## 2021-01-04 HISTORY — PX: XI ROBOTIC ASSISTED HIATAL HERNIA REPAIR: SHX6889

## 2021-01-04 LAB — BASIC METABOLIC PANEL
Anion gap: 8 (ref 5–15)
BUN: 7 mg/dL — ABNORMAL LOW (ref 8–23)
CO2: 23 mmol/L (ref 22–32)
Calcium: 8.1 mg/dL — ABNORMAL LOW (ref 8.9–10.3)
Chloride: 112 mmol/L — ABNORMAL HIGH (ref 98–111)
Creatinine, Ser: 0.89 mg/dL (ref 0.61–1.24)
GFR, Estimated: >60 mL/min (ref >60)
Glucose, Bld: 119 mg/dL — ABNORMAL HIGH (ref 70–99)
Potassium: 2.8 mmol/L — ABNORMAL LOW (ref 3.5–5.1)
Sodium: 143 mmol/L (ref 135–145)

## 2021-01-04 LAB — CBC
HCT: 29.7 % — ABNORMAL LOW (ref 39.0–52.0)
Hemoglobin: 9.4 g/dL — ABNORMAL LOW (ref 13.0–17.0)
MCH: 28.1 pg (ref 26.0–34.0)
MCHC: 31.6 g/dL (ref 30.0–36.0)
MCV: 88.9 fL (ref 80.0–100.0)
Platelets: 159 10*3/uL (ref 150–400)
RBC: 3.34 MIL/uL — ABNORMAL LOW (ref 4.22–5.81)
RDW: 13.6 % (ref 11.5–15.5)
WBC: 8.8 10*3/uL (ref 4.0–10.5)
nRBC: 0 % (ref 0.0–0.2)

## 2021-01-04 LAB — TYPE AND SCREEN
ABO/RH(D): A POS
Antibody Screen: NEGATIVE

## 2021-01-04 LAB — POTASSIUM: Potassium: 3.3 mmol/L — ABNORMAL LOW (ref 3.5–5.1)

## 2021-01-04 LAB — MAGNESIUM: Magnesium: 1.8 mg/dL (ref 1.7–2.4)

## 2021-01-04 SURGERY — REPAIR, HERNIA, HIATAL, ROBOT-ASSISTED
Anesthesia: General | Site: Abdomen

## 2021-01-04 MED ORDER — BUPIVACAINE LIPOSOME 1.3 % IJ SUSP
20.0000 mL | Freq: Once | INTRAMUSCULAR | Status: AC
  Administered 2021-01-04: 20 mL
  Filled 2021-01-04: qty 20

## 2021-01-04 MED ORDER — SODIUM CHLORIDE 0.9 % IV SOLN
INTRAVENOUS | Status: AC | PRN
  Administered 2021-01-04: 20 mL

## 2021-01-04 MED ORDER — SUCCINYLCHOLINE CHLORIDE 200 MG/10ML IV SOSY
PREFILLED_SYRINGE | INTRAVENOUS | Status: DC | PRN
  Administered 2021-01-04: 120 mg via INTRAVENOUS

## 2021-01-04 MED ORDER — DEXAMETHASONE SODIUM PHOSPHATE 10 MG/ML IJ SOLN
INTRAMUSCULAR | Status: DC | PRN
  Administered 2021-01-04: 6 mg via INTRAVENOUS

## 2021-01-04 MED ORDER — FENTANYL CITRATE (PF) 100 MCG/2ML IJ SOLN
INTRAMUSCULAR | Status: DC | PRN
  Administered 2021-01-04 (×4): 50 ug via INTRAVENOUS

## 2021-01-04 MED ORDER — ENOXAPARIN SODIUM 40 MG/0.4ML ~~LOC~~ SOLN
40.0000 mg | SUBCUTANEOUS | Status: DC
  Administered 2021-01-05: 40 mg via SUBCUTANEOUS
  Filled 2021-01-04: qty 0.4

## 2021-01-04 MED ORDER — FENTANYL CITRATE (PF) 100 MCG/2ML IJ SOLN
INTRAMUSCULAR | Status: AC
  Filled 2021-01-04: qty 2

## 2021-01-04 MED ORDER — LACTATED RINGERS IR SOLN
Status: DC | PRN
Start: 2021-01-04 — End: 2021-01-04
  Administered 2021-01-04: 1000 mL

## 2021-01-04 MED ORDER — ESMOLOL HCL 100 MG/10ML IV SOLN
INTRAVENOUS | Status: AC
  Filled 2021-01-04: qty 10

## 2021-01-04 MED ORDER — LACTATED RINGERS IV SOLN: INTRAVENOUS | Status: DC | PRN

## 2021-01-04 MED ORDER — PROPOFOL 10 MG/ML IV BOLUS
INTRAVENOUS | Status: DC | PRN
  Administered 2021-01-04: 150 mg via INTRAVENOUS

## 2021-01-04 MED ORDER — POTASSIUM CHLORIDE 10 MEQ/100ML IV SOLN: 10.0000 meq | INTRAVENOUS | Status: DC

## 2021-01-04 MED ORDER — LACTATED RINGERS IV SOLN: INTRAVENOUS | Status: DC

## 2021-01-04 MED ORDER — PHENYLEPHRINE 40 MCG/ML (10ML) SYRINGE FOR IV PUSH (FOR BLOOD PRESSURE SUPPORT)
PREFILLED_SYRINGE | INTRAVENOUS | Status: DC | PRN
  Administered 2021-01-04 (×3): 80 ug via INTRAVENOUS

## 2021-01-04 MED ORDER — ONDANSETRON HCL 4 MG/2ML IJ SOLN
INTRAMUSCULAR | Status: AC
  Filled 2021-01-04: qty 2

## 2021-01-04 MED ORDER — ONDANSETRON 4 MG PO TBDP: 4.0000 mg | ORAL_TABLET | Freq: Four times a day (QID) | ORAL | Status: DC | PRN

## 2021-01-04 MED ORDER — EPHEDRINE SULFATE-NACL 50-0.9 MG/10ML-% IV SOSY
PREFILLED_SYRINGE | INTRAVENOUS | Status: DC | PRN
  Administered 2021-01-04: 15 mg via INTRAVENOUS

## 2021-01-04 MED ORDER — HYDROMORPHONE HCL 1 MG/ML IJ SOLN: 0.5000 mg | INTRAMUSCULAR | Status: DC | PRN

## 2021-01-04 MED ORDER — BUPIVACAINE-EPINEPHRINE 0.25% -1:200000 IJ SOLN
INTRAMUSCULAR | Status: DC | PRN
  Administered 2021-01-04: 16 mL

## 2021-01-04 MED ORDER — ONDANSETRON HCL 4 MG/2ML IJ SOLN
INTRAMUSCULAR | Status: DC | PRN
  Administered 2021-01-04 (×2): 4 mg via INTRAVENOUS

## 2021-01-04 MED ORDER — MIDAZOLAM HCL 2 MG/2ML IJ SOLN
INTRAMUSCULAR | Status: AC
  Filled 2021-01-04: qty 2

## 2021-01-04 MED ORDER — POTASSIUM CHLORIDE 10 MEQ/100ML IV SOLN
10.0000 meq | INTRAVENOUS | Status: AC
  Administered 2021-01-04 (×3): 10 meq via INTRAVENOUS
  Filled 2021-01-04 (×3): qty 100

## 2021-01-04 MED ORDER — MIDAZOLAM HCL 5 MG/5ML IJ SOLN
INTRAMUSCULAR | Status: DC | PRN
  Administered 2021-01-04: 1 mg via INTRAVENOUS

## 2021-01-04 MED ORDER — ONDANSETRON HCL 4 MG/2ML IJ SOLN: 4.0000 mg | Freq: Four times a day (QID) | INTRAMUSCULAR | Status: DC | PRN

## 2021-01-04 MED ORDER — 0.9 % SODIUM CHLORIDE (POUR BTL) OPTIME
TOPICAL | Status: DC | PRN
  Administered 2021-01-04: 1000 mL

## 2021-01-04 MED ORDER — EPHEDRINE 5 MG/ML INJ
INTRAVENOUS | Status: AC
  Filled 2021-01-04: qty 10

## 2021-01-04 MED ORDER — LIDOCAINE 2% (20 MG/ML) 5 ML SYRINGE
INTRAMUSCULAR | Status: DC | PRN
  Administered 2021-01-04: 80 mg via INTRAVENOUS

## 2021-01-04 MED ORDER — HYDROMORPHONE HCL 1 MG/ML IJ SOLN: 0.2500 mg | INTRAMUSCULAR | Status: DC | PRN

## 2021-01-04 MED ORDER — PROPOFOL 10 MG/ML IV BOLUS
INTRAVENOUS | Status: AC
  Filled 2021-01-04: qty 20

## 2021-01-04 MED ORDER — SUGAMMADEX SODIUM 200 MG/2ML IV SOLN
INTRAVENOUS | Status: DC | PRN
  Administered 2021-01-04: 150 mg via INTRAVENOUS

## 2021-01-04 MED ORDER — DEXTROSE-NACL 5-0.9 % IV SOLN: INTRAVENOUS | Status: DC

## 2021-01-04 MED ORDER — PHENYLEPHRINE HCL (PRESSORS) 10 MG/ML IV SOLN
INTRAVENOUS | Status: AC
  Filled 2021-01-04: qty 1

## 2021-01-04 MED ORDER — KETOROLAC TROMETHAMINE 15 MG/ML IJ SOLN
15.0000 mg | Freq: Four times a day (QID) | INTRAMUSCULAR | Status: DC | PRN
  Administered 2021-01-05: 15 mg via INTRAVENOUS
  Filled 2021-01-04: qty 1

## 2021-01-04 MED ORDER — ROCURONIUM BROMIDE 10 MG/ML (PF) SYRINGE
PREFILLED_SYRINGE | INTRAVENOUS | Status: DC | PRN
  Administered 2021-01-04: 50 mg via INTRAVENOUS

## 2021-01-04 MED ORDER — BUPIVACAINE-EPINEPHRINE (PF) 0.25% -1:200000 IJ SOLN
INTRAMUSCULAR | Status: AC
  Filled 2021-01-04: qty 30

## 2021-01-04 SURGICAL SUPPLY — 62 items
APPLIER CLIP 5 13 M/L LIGAMAX5 (MISCELLANEOUS)
APPLIER CLIP ROT 10 11.4 M/L (STAPLE)
BLADE SURG SZ11 CARB STEEL (BLADE) ×2 IMPLANT
CHLORAPREP W/TINT 26 (MISCELLANEOUS) ×2 IMPLANT
CLIP APPLIE 5 13 M/L LIGAMAX5 (MISCELLANEOUS) IMPLANT
CLIP APPLIE ROT 10 11.4 M/L (STAPLE) IMPLANT
CLIP VESOLOCK LG 6/CT PURPLE (CLIP) IMPLANT
CLIP VESOLOCK MED LG 6/CT (CLIP) IMPLANT
COVER SURGICAL LIGHT HANDLE (MISCELLANEOUS) ×2 IMPLANT
COVER TIP SHEARS 8 DVNC (MISCELLANEOUS) IMPLANT
COVER TIP SHEARS 8MM DA VINCI (MISCELLANEOUS)
COVER WAND RF STERILE (DRAPES) ×2 IMPLANT
DECANTER SPIKE VIAL GLASS SM (MISCELLANEOUS) ×2 IMPLANT
DERMABOND ADVANCED (GAUZE/BANDAGES/DRESSINGS) ×1
DERMABOND ADVANCED .7 DNX12 (GAUZE/BANDAGES/DRESSINGS) ×1 IMPLANT
DRAIN PENROSE 0.5X18 (DRAIN) ×2 IMPLANT
DRAPE ARM DVNC X/XI (DISPOSABLE) ×4 IMPLANT
DRAPE COLUMN DVNC XI (DISPOSABLE) ×1 IMPLANT
DRAPE DA VINCI XI ARM (DISPOSABLE) ×8
DRAPE DA VINCI XI COLUMN (DISPOSABLE) ×2
ELECT REM PT RETURN 15FT ADLT (MISCELLANEOUS) ×2 IMPLANT
ENDOLOOP SUT PDS II  0 18 (SUTURE)
ENDOLOOP SUT PDS II 0 18 (SUTURE) IMPLANT
GAUZE 4X4 16PLY RFD (DISPOSABLE) ×2 IMPLANT
GLOVE BIO SURGEON STRL SZ7.5 (GLOVE) ×4 IMPLANT
GOWN STRL REUS W/TWL XL LVL3 (GOWN DISPOSABLE) ×8 IMPLANT
KIT BASIN OR (CUSTOM PROCEDURE TRAY) ×2 IMPLANT
KIT TURNOVER KIT A (KITS) IMPLANT
MARKER SKIN DUAL TIP RULER LAB (MISCELLANEOUS) ×2 IMPLANT
MESH BIO-A 7X10 SYN MAT (Mesh General) ×2 IMPLANT
NEEDLE HYPO 22GX1.5 SAFETY (NEEDLE) ×2 IMPLANT
NEEDLE INSUFFLATION 14GA 120MM (NEEDLE) ×2 IMPLANT
OBTURATOR OPTICAL STANDARD 8MM (TROCAR)
OBTURATOR OPTICAL STND 8 DVNC (TROCAR)
OBTURATOR OPTICALSTD 8 DVNC (TROCAR) IMPLANT
PACK CARDIOVASCULAR III (CUSTOM PROCEDURE TRAY) ×2 IMPLANT
PAD POSITIONING PINK XL (MISCELLANEOUS) ×2 IMPLANT
PENCIL SMOKE EVACUATOR (MISCELLANEOUS) IMPLANT
SCISSORS LAP 5X35 DISP (ENDOMECHANICALS) IMPLANT
SEAL CANN UNIV 5-8 DVNC XI (MISCELLANEOUS) ×4 IMPLANT
SEAL XI 5MM-8MM UNIVERSAL (MISCELLANEOUS) ×8
SEALER VESSEL DA VINCI XI (MISCELLANEOUS) ×2
SEALER VESSEL EXT DVNC XI (MISCELLANEOUS) ×1 IMPLANT
SET IRRIG TUBING LAPAROSCOPIC (IRRIGATION / IRRIGATOR) ×2 IMPLANT
SOL ANTI FOG 6CC (MISCELLANEOUS) ×1 IMPLANT
SOLUTION ANTI FOG 6CC (MISCELLANEOUS) ×1
SOLUTION ELECTROLUBE (MISCELLANEOUS) ×2 IMPLANT
SPONGE LAP 18X18 RF (DISPOSABLE) ×2 IMPLANT
SPONGE LAP 4X18 RFD (DISPOSABLE) ×2 IMPLANT
SUT DVC VLOC 180 2-0 12IN GS21 (SUTURE)
SUT ETHIBOND 0 36 GRN (SUTURE) ×4 IMPLANT
SUT MNCRL AB 4-0 PS2 18 (SUTURE) ×2 IMPLANT
SUT SILK 0 SH 30 (SUTURE) ×6 IMPLANT
SUT SILK 2 0 SH (SUTURE) ×4 IMPLANT
SUT VLOC 180 ABS0 18IN GS21 (SUTURE) IMPLANT
SUTURE DVC VL 180 2-0 12INGS21 (SUTURE) IMPLANT
SYR 20ML LL LF (SYRINGE) ×2 IMPLANT
TOWEL OR 17X26 10 PK STRL BLUE (TOWEL DISPOSABLE) ×2 IMPLANT
TOWEL OR NON WOVEN STRL DISP B (DISPOSABLE) ×2 IMPLANT
TRAY FOLEY MTR SLVR 16FR STAT (SET/KITS/TRAYS/PACK) IMPLANT
TROCAR ADV FIXATION 5X100MM (TROCAR) ×2 IMPLANT
TUBING INSUFFLATION 10FT LAP (TUBING) ×2 IMPLANT

## 2021-01-04 NOTE — Op Note (Signed)
01/04/2021  3:09 PM  PATIENT:  Darrell Hoffman.  70 y.o. male  PRE-OPERATIVE DIAGNOSIS:  hiatal hernia  POST-OPERATIVE DIAGNOSIS:  hiatal hernia  PROCEDURE:  Procedure(s): XI ROBOTIC ASSISTED HIATAL HERNIA REPAIR AND TOUPET  FUNDOPLICATION WITH MESH (N/A)  SURGEON:  Surgeon(s) and Role:    * Ralene Ok, MD - Primary    Michael Boston, MD - Assisting - Who was essential for identifying the anatomy, retracting the anatomy, and suturing the fundoplication.   ANESTHESIA:   local and general  EBL:  minimal   BLOOD ADMINISTERED:none  DRAINS: none   LOCAL MEDICATIONS USED:  BUPIVICAINE   SPECIMEN:  No Specimen  DISPOSITION OF SPECIMEN:  PATHOLOGY  COUNTS:  YES  TOURNIQUET:  * No tourniquets in log *  DICTATION: .Dragon Dictation The patient was taken back to the operating room and placed in the supine position with bilateral SCDs in place. The patient was prepped and draped in the usual sterile fashion. After appropriate antibiotics were confirmed a timeout was called and all facts were verified.   A Veress needle technique was used to insufflate the abdomen to 15 mm of mercury the paramedian stab incision. Subsequent to this an 8 mm trocar was introduced as was a 8 millimeter camera. At this time the subsequent robotic trochars x3, were then placed adjacent to this trocar approximately 8-10 cm away. Each trocar was inserted under direct visualization, there were total of 4 trochars. The assistant trocar was then placed in the right lower quadrant under direct visualization. The Nathanson retractor was then visualized inserted into the abdomen and the incision just to the left of the falciform ligament. This was then placed to retract the liver appropriately. At this time the patient was positioned in reverse Trendelenburg.   At this time the robot patient cart was brought to the bedside and placed in good position and the arms were docked to the trochars appropriately.  The  hernia included all the stomach and part of the transverse colon. I retracted the hernia and the hernia sac. At this time I proceeded to incised the gastrohepatic ligament.  At this time I proceeded to mobilize the stomach inferiorly.  The hernia sac was dissected away from the crus and the hiatus.  The peritoneum over the right crus was incised and right crus was identified. I proceeded to dissect this inferiorly until the left crus was seen joining the right crus. Once the right crus was adequately dissected we turned our to the left crus which was dissected away. This required traction of the stomach to the right side. Once this was visualized we then proceeded to circumferentially dissect the esophagus away from the surrounding tissue up to the mediastinum.  The anterior and posterior vagus was seen along the esophagus at the GE junction.  These were both preserved throughout the entire case.  At this time the phrenoesophageal fat pad was dissected away from the esophagus. There was a extra-large-sized hiatal hernia seen. I mobilized the esophagus cephalad approximately 5-6 cm, clearing away the surrounding tissue. The anterior and posterior hernia sac was dissected away from the stomach and esophagus.  At this time we turned our attention to the greater curvature the stomach and the omentum was mobilized using the robotic vessel sealer. This was taken up to the greater curvature to the hiatus. This mobilized the entire greater curvature to allow mobilization and the wrap. I then proceeded to bring the greater curvature the stomach posterior to the  esophagus, and a shoeshine technique was used to evaluate the mobilization of the greater curvature.   At this time I proceeded to close the hiatus using interrupted 0 Ethibonds x 4. This brought together the hiatal closure without undue stricture to the esophagus.   A piece of Gore Bio A hiatal mesh was placed over the hiatal closure and sutured to the crus  using 0 ethibonds sutures x 4.  At this time the greater curvature was brought around the esophagus and sutured using 0 silk sutures interrupted fashion approximately 1 cm apart x3 on each side of the esophagus in a Toupet fashion. A left collar stitch was then used to gastropexy the stomach from the wrap to the diaphragm just lateral to the left crus as.  A second collar stitch was placed from the wrap to the right crus. The wrap lay posteriorly on its own with undue tension.  The wrap lay loose with no strangulation of the esophagus.  At this time the robot was undocked. The liver trocar was removed. At this time insufflation was evacuated. Skin was reapproximated for Monocryl subcuticular fashion. The skin was then dressed with Dermabond. The patient tolerated the procedure well and was taken to the recovery room in stable condition.   PLAN OF CARE: Admit to inpatient   PATIENT DISPOSITION:  PACU - hemodynamically stable.   Delay start of Pharmacological VTE agent (>24hrs) due to surgical blood loss or risk of bleeding: not applicable

## 2021-01-04 NOTE — Anesthesia Postprocedure Evaluation (Signed)
Anesthesia Post Note  Patient: Darrell Hoffman.  Procedure(s) Performed: XI ROBOTIC ASSISTED HIATAL HERNIA REPAIR AND FUNDOPLICATION WITH MESH (N/A Abdomen)     Patient location during evaluation: PACU Anesthesia Type: General Level of consciousness: awake Pain management: pain level controlled Vital Signs Assessment: post-procedure vital signs reviewed and stable Respiratory status: spontaneous breathing Cardiovascular status: stable Postop Assessment: no apparent nausea or vomiting Anesthetic complications: no   No complications documented.  Last Vitals:  Vitals:   01/04/21 1545 01/04/21 1612  BP: 138/89 (!) 144/91  Pulse: 81 79  Resp: 11 15  Temp:    SpO2: 100% 100%    Last Pain:  Vitals:   01/04/21 1612  TempSrc:   PainSc: 0-No pain                 Atzel Mccambridge

## 2021-01-04 NOTE — Anesthesia Preprocedure Evaluation (Signed)
Anesthesia Evaluation  Patient identified by MRN, date of birth, ID band Patient awake    Reviewed: Allergy & Precautions, NPO status , Patient's Chart, lab work & pertinent test results  Airway Mallampati: II  TM Distance: >3 FB     Dental   Pulmonary asthma , Current Smoker and Patient abstained from smoking.,    breath sounds clear to auscultation       Cardiovascular hypertension,  Rhythm:Regular Rate:Normal     Neuro/Psych  Neuromuscular disease    GI/Hepatic Neg liver ROS, hiatal hernia, GERD  ,  Endo/Other  negative endocrine ROS  Renal/GU Renal disease     Musculoskeletal   Abdominal   Peds  Hematology  (+) anemia ,   Anesthesia Other Findings   Reproductive/Obstetrics                             Anesthesia Physical Anesthesia Plan  ASA: III  Anesthesia Plan: General   Post-op Pain Management:    Induction: Intravenous  PONV Risk Score and Plan: 2 and Ondansetron, Dexamethasone and Midazolam  Airway Management Planned: Oral ETT  Additional Equipment:   Intra-op Plan:   Post-operative Plan: Possible Post-op intubation/ventilation  Informed Consent: I have reviewed the patients History and Physical, chart, labs and discussed the procedure including the risks, benefits and alternatives for the proposed anesthesia with the patient or authorized representative who has indicated his/her understanding and acceptance.     Dental advisory given  Plan Discussed with: CRNA and Anesthesiologist  Anesthesia Plan Comments:         Anesthesia Quick Evaluation

## 2021-01-04 NOTE — Progress Notes (Signed)
Day of Surgery   Subjective/Chief Complaint: Patient without complaint.  Going to OR today with Dr. Rosendo Gros.  He has discussed surgery with him already.   Objective: Vital signs in last 24 hours: Temp:  [98.3 F (36.8 C)-99.4 F (37.4 C)] 98.6 F (37 C) (01/20 0610) Pulse Rate:  [61-79] 67 (01/20 0610) Resp:  [14-17] 17 (01/20 0610) BP: (124-144)/(74-87) 124/74 (01/20 0610) SpO2:  [100 %] 100 % (01/20 0610) Last BM Date: 01/01/21  Intake/Output from previous day: 01/19 0701 - 01/20 0700 In: 3110 [P.O.:190; I.V.:2920] Out: 2610 [Urine:1110; Emesis/NG output:1500] Intake/Output this shift: No intake/output data recorded.   Gen: Alert, NAD, pleasant HEENT: EOM's intact, pupils equal and round Card: RRR Pulm: CTAB, no W/R/R, effort normal Abd: Soft, NT/ND,+ BS, NGT in place with bilious output in cannister.1.7L/24 hours from NGT. Ext: No LE edema or calf tenderness Psych: A&Ox3  Skin: no rashes noted, warm and dry  Lab Results:  Recent Labs    01/02/21 0625 01/04/21 0346  WBC 10.0 8.8  HGB 10.6* 9.4*  HCT 33.0* 29.7*  PLT 226 159   BMET Recent Labs    01/02/21 0625 01/04/21 0346  NA 145 143  K 3.7 2.8*  CL 105 112*  CO2 29 23  GLUCOSE 115* 119*  BUN 16 7*  CREATININE 1.09 0.89  CALCIUM 8.9 8.1*   PT/INR No results for input(s): LABPROT, INR in the last 72 hours. ABG No results for input(s): PHART, HCO3 in the last 72 hours.  Invalid input(s): PCO2, PO2  Studies/Results: No results found.  Anti-infectives: Anti-infectives (From admission, onward)   Start     Dose/Rate Route Frequency Ordered Stop   01/04/21 0600  ceFAZolin (ANCEF) IVPB 2g/100 mL premix        2 g 200 mL/hr over 30 Minutes Intravenous On call to O.R. 01/03/21 1226 01/05/21 0559      Assessment/Plan: HTN - not on any meds for this at home. PRN metoprolol HLD GERD Hx of Asthma  Protein Calorie Malnutrition - Pre-alb 17.2  Hiatal hernia w/ possible GOO - S/p NGT  decompression with improvement of symptoms - Patient previously scheduled for robotichernia repair with mesh and toupee fundoplicationwith Dr. Rosendo Gros on 01/17/2021. Dr. Rosendo Gros is now planning for surgery today  - Continue NGT decompression until time of surgery - Mobilize. Okay with clamping NGT to mobilize.  - AM labs  - Pulm toilet  FEN -NPO, NGT, IVF VTE -SCDs, Lovenox ID -None Foley - None    LOS: 3 days    Turner Daniels MD  01/04/2021

## 2021-01-04 NOTE — Anesthesia Procedure Notes (Signed)
Procedure Name: Intubation Date/Time: 01/04/2021 1:02 PM Performed by: Lavina Hamman, CRNA Pre-anesthesia Checklist: Patient identified, Emergency Drugs available, Suction available and Patient being monitored Patient Re-evaluated:Patient Re-evaluated prior to induction Oxygen Delivery Method: Circle System Utilized Preoxygenation: Pre-oxygenation with 100% oxygen Induction Type: IV induction Ventilation: Mask ventilation without difficulty Laryngoscope Size: Mac and 3 Grade View: Grade I Tube type: Oral Tube size: 7.5 mm Number of attempts: 1 Airway Equipment and Method: Stylet and Oral airway Placement Confirmation: ETT inserted through vocal cords under direct vision,  positive ETCO2 and breath sounds checked- equal and bilateral Secured at: 23 cm Tube secured with: Tape Dental Injury: Teeth and Oropharynx as per pre-operative assessment

## 2021-01-04 NOTE — Discharge Instructions (Signed)
EATING AFTER YOUR ESOPHAGEAL SURGERY (Stomach Fundoplication, Hiatal Hernia repair, Achalasia surgery, etc)  ######################################################################  EAT Start with a pureed / full liquid diet (see below) Gradually transition to a high fiber diet with a fiber supplement over the next month after discharge.    WALK Walk an hour a day.  Control your pain to do that.    CONTROL PAIN Control pain so that you can walk, sleep, tolerate sneezing/coughing, go up/down stairs.  HAVE A BOWEL MOVEMENT DAILY Keep your bowels regular to avoid problems.  OK to try a laxative to override constipation.  OK to use an antidairrheal to slow down diarrhea.  Call if not better after 2 tries  CALL IF YOU HAVE PROBLEMS/CONCERNS Call if you are still struggling despite following these instructions. Call if you have concerns not answered by these instructions  ######################################################################   After your esophageal surgery, expect some sticking with swallowing over the next 1-2 months.    If food sticks when you eat, it is called "dysphagia".  This is due to swelling around your esophagus at the wrap & hiatal diaphragm repair.  It will gradually ease off over the next few months.  To help you through this temporary phase, we start you out on a pureed (blenderized) diet.  Your first meal in the hospital was thin liquids.  You should have been given a pureed diet by the time you left the hospital.  We ask patients to stay on a pureed diet for the first 2-3 weeks to avoid anything getting "stuck" near your recent surgery.  Don't be alarmed if your ability to swallow doesn't progress according to this plan.  Everyone is different and some diets can advance more or less quickly.    It is often helpful to crush your medications or split them as they can sometimes stick, especially the first week or so.   Some BASIC RULES to follow  are:  Maintain an upright position whenever eating or drinking.  Take small bites - just a teaspoon size bite at a time.  Eat slowly.  It may also help to eat only one food at a time.  Consider nibbling through smaller, more frequent meals & avoid the urge to eat BIG meals  Do not push through feelings of fullness, nausea, or bloatedness  Do not mix solid foods and liquids in the same mouthful  Try not to "wash foods down" with large gulps of liquids.  Avoid carbonated (bubbly/fizzy) drinks.    Avoid foods that make you feel gassy or bloated.  Start with bland foods first.  Wait on trying greasy, fried, or spicy meals until you are tolerating more bland solids well.  Understand that it will be hard to burp and belch at first.  This gradually improves with time.  Expect to be more gassy/flatulent/bloated initially.  Walking will help your body manage it better.  Consider using medications for bloating that contain simethicone such as  Maalox or Gas-X   Consider crushing her medications, especially smaller pills.  The ability to swallow pills should get easier after a few weeks  Eat in a relaxed atmosphere & minimize distractions.  Avoid talking while eating.    Do not use straws.  Following each meal, sit in an upright position (90 degree angle) for 60 to 90 minutes.  Going for a short walk can help as well  If food does stick, don't panic.  Try to relax and let the food pass on its own.    Sipping WARM LIQUID such as strong hot black tea can also help slide it down.   Be gradual in changes & use common sense:  -If you easily tolerating a certain "level" of foods, advance to the next level gradually -If you are having trouble swallowing a particular food, then avoid it.   -If food is sticking when you advance your diet, go back to thinner previous diet (the lower LEVEL) for 1-2 days.  LEVEL 1 = PUREED DIET  Do for the first 2 WEEKS AFTER SURGERY  -Foods in this group are  pureed or blenderized to a smooth, mashed potato-like consistency.  -If necessary, the pureed foods can keep their shape with the addition of a thickening agent.   -Meat should be pureed to a smooth, pasty consistency.  Hot broth or gravy may be added to the pureed meat, approximately 1 oz. of liquid per 3 oz. serving of meat. -CAUTION:  If any foods do not puree into a smooth consistency, swallowing will be more difficult.  (For example, nuts or seeds sometimes do not blend well.)  Hot Foods Cold Foods  Pureed scrambled eggs and cheese Pureed cottage cheese  Baby cereals Thickened juices and nectars  Thinned cooked cereals (no lumps) Thickened milk or eggnog  Pureed French toast or pancakes Ensure  Mashed potatoes Ice cream  Pureed parsley, au gratin, scalloped potatoes, candied sweet potatoes Fruit or Italian ice, sherbet  Pureed buttered or alfredo noodles Plain yogurt  Pureed vegetables (no corn or peas) Instant breakfast  Pureed soups and creamed soups Smooth pudding, mousse, custard  Pureed scalloped apples Whipped gelatin  Gravies Sugar, syrup, honey, jelly  Sauces, cheese, tomato, barbecue, white, creamed Cream  Any baby food Creamer  Alcohol in moderation (not beer or champagne) Margarine  Coffee or tea Mayonnaise   Ketchup, mustard   Apple sauce   SAMPLE MENU:  PUREED DIET Breakfast Lunch Dinner   Orange juice, 1/2 cup  Cream of wheat, 1/2 cup  Pineapple juice, 1/2 cup  Pureed turkey, barley soup, 3/4 cup  Pureed Hawaiian chicken, 3 oz   Scrambled eggs, mashed or blended with cheese, 1/2 cup  Tea or coffee, 1 cup   Whole milk, 1 cup   Non-dairy creamer, 2 Tbsp.  Mashed potatoes, 1/2 cup  Pureed cooled broccoli, 1/2 cup  Apple sauce, 1/2 cup  Coffee or tea  Mashed potatoes, 1/2 cup  Pureed spinach, 1/2 cup  Frozen yogurt, 1/2 cup  Tea or coffee      LEVEL 2 = SOFT DIET  After your first 2 weeks, you can advance to a soft diet.   Keep on this  diet until everything goes down easily.  Hot Foods Cold Foods  White fish Cottage cheese  Stuffed fish Junior baby fruit  Baby food meals Semi thickened juices  Minced soft cooked, scrambled, poached eggs nectars  Souffle & omelets Ripe mashed bananas  Cooked cereals Canned fruit, pineapple sauce, milk  potatoes Milkshake  Buttered or Alfredo noodles Custard  Cooked cooled vegetable Puddings, including tapioca  Sherbet Yogurt  Vegetable soup or alphabet soup Fruit ice, Italian ice  Gravies Whipped gelatin  Sugar, syrup, honey, jelly Junior baby desserts  Sauces:  Cheese, creamed, barbecue, tomato, white Cream  Coffee or tea Margarine   SAMPLE MENU:  LEVEL 2 Breakfast Lunch Dinner   Orange juice, 1/2 cup  Oatmeal, 1/2 cup  Scrambled eggs with cheese, 1/2 cup  Decaffeinated tea, 1 cup  Whole milk, 1 cup    Non-dairy creamer, 2 Tbsp  Pineapple juice, 1/2 cup  Minced beef, 3 oz  Gravy, 2 Tbsp  Mashed potatoes, 1/2 cup  Minced fresh broccoli, 1/2 cup  Applesauce, 1/2 cup  Coffee, 1 cup  Turkey, barley soup, 3/4 cup  Minced Hawaiian chicken, 3 oz  Mashed potatoes, 1/2 cup  Cooked spinach, 1/2 cup  Frozen yogurt, 1/2 cup  Non-dairy creamer, 2 Tbsp      LEVEL 3 = CHOPPED DIET  -After all the foods in level 2 (soft diet) are passing through well you should advance up to more chopped foods.  -It is still important to cut these foods into small pieces and eat slowly.  Hot Foods Cold Foods  Poultry Cottage cheese  Chopped Swedish meatballs Yogurt  Meat salads (ground or flaked meat) Milk  Flaked fish (tuna) Milkshakes  Poached or scrambled eggs Soft, cold, dry cereal  Souffles and omelets Fruit juices or nectars  Cooked cereals Chopped canned fruit  Chopped French toast or pancakes Canned fruit cocktail  Noodles or pasta (no rice) Pudding, mousse, custard  Cooked vegetables (no frozen peas, corn, or mixed vegetables) Green salad  Canned small sweet peas  Ice cream  Creamed soup or vegetable soup Fruit ice, Italian ice  Pureed vegetable soup or alphabet soup Non-dairy creamer  Ground scalloped apples Margarine  Gravies Mayonnaise  Sauces:  Cheese, creamed, barbecue, tomato, white Ketchup  Coffee or tea Mustard   SAMPLE MENU:  LEVEL 3 Breakfast Lunch Dinner   Orange juice, 1/2 cup  Oatmeal, 1/2 cup  Scrambled eggs with cheese, 1/2 cup  Decaffeinated tea, 1 cup  Whole milk, 1 cup  Non-dairy creamer, 2 Tbsp  Ketchup, 1 Tbsp  Margarine, 1 tsp  Salt, 1/4 tsp  Sugar, 2 tsp  Pineapple juice, 1/2 cup  Ground beef, 3 oz  Gravy, 2 Tbsp  Mashed potatoes, 1/2 cup  Cooked spinach, 1/2 cup  Applesauce, 1/2 cup  Decaffeinated coffee  Whole milk  Non-dairy creamer, 2 Tbsp  Margarine, 1 tsp  Salt, 1/4 tsp  Pureed turkey, barley soup, 3/4 cup  Barbecue chicken, 3 oz  Mashed potatoes, 1/2 cup  Ground fresh broccoli, 1/2 cup  Frozen yogurt, 1/2 cup  Decaffeinated tea, 1 cup  Non-dairy creamer, 2 Tbsp  Margarine, 1 tsp  Salt, 1/4 tsp  Sugar, 1 tsp    LEVEL 4:  REGULAR FOODS  -Foods in this group are soft, moist, regularly textured foods.   -This level includes meat and breads, which tend to be the hardest things to swallow.   -Eat very slowly, chew well and continue to avoid carbonated drinks. -most people are at this level in 4-6 weeks  Hot Foods Cold Foods  Baked fish or skinned Soft cheeses - cottage cheese  Souffles and omelets Cream cheese  Eggs Yogurt  Stuffed shells Milk  Spaghetti with meat sauce Milkshakes  Cooked cereal Cold dry cereals (no nuts, dried fruit, coconut)  French toast or pancakes Crackers  Buttered toast Fruit juices or nectars  Noodles or pasta (no rice) Canned fruit  Potatoes (all types) Ripe bananas  Soft, cooked vegetables (no corn, lima, or baked beans) Peeled, ripe, fresh fruit  Creamed soups or vegetable soup Cakes (no nuts, dried fruit, coconut)  Canned chicken  noodle soup Plain doughnuts  Gravies Ice cream  Bacon dressing Pudding, mousse, custard  Sauces:  Cheese, creamed, barbecue, tomato, white Fruit ice, Italian ice, sherbet  Decaffeinated tea or coffee Whipped gelatin  Pork chops Regular gelatin     Canned fruited gelatin molds   Sugar, syrup, honey, jam, jelly   Cream   Non-dairy   Margarine   Oil   Mayonnaise   Ketchup   Mustard   TROUBLESHOOTING IRREGULAR BOWELS  1) Avoid extremes of bowel movements (no bad constipation/diarrhea)  2) Miralax 17gm mixed in 8oz. water or juice-daily. May use BID as needed.  3) Gas-x,Phazyme, etc. as needed for gas & bloating.  4) Soft,bland diet. No spicy,greasy,fried foods.  5) Prilosec over-the-counter as needed  6) May hold gluten/wheat products from diet to see if symptoms improve.  7) May try probiotics (Align, Activa, etc) to help calm the bowels down  7) If symptoms become worse call back immediately.    If you have any questions please call our office at [facility]: [phone].  

## 2021-01-04 NOTE — Transfer of Care (Signed)
Immediate Anesthesia Transfer of Care Note  Patient: Darrell Hoffman.  Procedure(s) Performed: XI ROBOTIC ASSISTED HIATAL HERNIA REPAIR AND FUNDOPLICATION WITH MESH (N/A Abdomen)  Patient Location: PACU  Anesthesia Type:General  Level of Consciousness: drowsy  Airway & Oxygen Therapy: Patient Spontanous Breathing and Patient connected to face mask oxygen  Post-op Assessment: Report given to RN and Post -op Vital signs reviewed and stable  Post vital signs: Reviewed and stable  Last Vitals:  Vitals Value Taken Time  BP 146/87 01/04/21 1523  Temp    Pulse 90 01/04/21 1525  Resp 16 01/04/21 1525  SpO2 100 % 01/04/21 1525  Vitals shown include unvalidated device data.  Last Pain:  Vitals:   01/04/21 1150  TempSrc: Oral  PainSc:          Complications: No complications documented.

## 2021-01-05 ENCOUNTER — Inpatient Hospital Stay (HOSPITAL_COMMUNITY): Payer: Federal, State, Local not specified - PPO

## 2021-01-05 ENCOUNTER — Encounter (HOSPITAL_COMMUNITY): Payer: Self-pay | Admitting: General Surgery

## 2021-01-05 DIAGNOSIS — K449 Diaphragmatic hernia without obstruction or gangrene: Secondary | ICD-10-CM | POA: Diagnosis not present

## 2021-01-05 DIAGNOSIS — E440 Moderate protein-calorie malnutrition: Secondary | ICD-10-CM | POA: Insufficient documentation

## 2021-01-05 LAB — BASIC METABOLIC PANEL
Anion gap: 8 (ref 5–15)
BUN: 10 mg/dL (ref 8–23)
CO2: 24 mmol/L (ref 22–32)
Calcium: 8 mg/dL — ABNORMAL LOW (ref 8.9–10.3)
Chloride: 111 mmol/L (ref 98–111)
Creatinine, Ser: 0.98 mg/dL (ref 0.61–1.24)
GFR, Estimated: >60 mL/min (ref >60)
Glucose, Bld: 117 mg/dL — ABNORMAL HIGH (ref 70–99)
Potassium: 3.3 mmol/L — ABNORMAL LOW (ref 3.5–5.1)
Sodium: 143 mmol/L (ref 135–145)

## 2021-01-05 LAB — CBC
HCT: 28.1 % — ABNORMAL LOW (ref 39.0–52.0)
Hemoglobin: 8.8 g/dL — ABNORMAL LOW (ref 13.0–17.0)
MCH: 27.2 pg (ref 26.0–34.0)
MCHC: 31.3 g/dL (ref 30.0–36.0)
MCV: 86.7 fL (ref 80.0–100.0)
Platelets: 160 10*3/uL (ref 150–400)
RBC: 3.24 MIL/uL — ABNORMAL LOW (ref 4.22–5.81)
RDW: 13.3 % (ref 11.5–15.5)
WBC: 9.4 10*3/uL (ref 4.0–10.5)
nRBC: 0 % (ref 0.0–0.2)

## 2021-01-05 MED ORDER — ACETAMINOPHEN 325 MG PO TABS: 650.0000 mg | ORAL_TABLET | Freq: Four times a day (QID) | ORAL | Status: DC | PRN

## 2021-01-05 NOTE — Progress Notes (Signed)
Nutrition Follow-up  DOCUMENTATION CODES:   Non-severe (moderate) malnutrition in context of acute illness/injury  INTERVENTION:   Once diet advanced: -Ensure Surgery po TID, each supplement provides 330 kcal and 18 grams of protein  -Reviewed protein supplement options for at home -Provided "Pureed nutrition therapy" handout for pt's reference   NUTRITION DIAGNOSIS:   Moderate Malnutrition related to acute illness (hiatal hernia) as evidenced by percent weight loss,moderate muscle depletion.  Ongoing.  GOAL:   Patient will meet greater than or equal to 90% of their needs  Not meeting, diet just advanced to clears.  MONITOR:   Labs,Weight trends,I & O's,Diet advancement  REASON FOR ASSESSMENT:   Consult Diet education  ASSESSMENT:   70 y.o. male w/ hx of large hiatal hernia pending surgical repair on 01/17/2021 here with n/v.  Patient reports that he has had epigastric pain, persistent nausea and vomiting for at least 24 hours.  He said he has been having difficulty with bowel movements for the past several days, and had a very small bowel movement to help of a suppository several days ago.  1/20: s/p XI ROBOTIC ASSISTED HIATAL HERNIA REPAIR AND TOUPET  FUNDOPLICATION WITH MESH (N/A)   RD consulted for diet education. Pt has been advised by surgery to follow a pureed diet for 2 weeks post-op. Provided handout and reviewed options for pt to puree at home. States he is able to puree but would like to stick with liquids and foods that are already puree consistency (such as baby food). Reviewed how to boost the calories in his protein shakes.  Admission weight: 148 lbs. Current weight: 136 lbs.  Medications: D5 infusion  Labs reviewed:  Low K  Diet Order:   Diet Order            Diet clear liquid Room service appropriate? Yes; Fluid consistency: Thin  Diet effective now                 EDUCATION NEEDS:   No education needs have been identified at this  time  Skin:  Skin Assessment: Reviewed RN Assessment  Last BM:  1/17  Height:   Ht Readings from Last 1 Encounters:  01/01/21 5\' 6"  (1.676 m)    Weight:   Wt Readings from Last 1 Encounters:  01/04/21 62 kg    BMI:  Body mass index is 22.05 kg/m.  Estimated Nutritional Needs:   Kcal:  1700-1900  Protein:  80-95g  Fluid:  1.9L/day   Clayton Bibles, MS, RD, LDN Inpatient Clinical Dietitian Contact information available via Amion

## 2021-01-05 NOTE — Plan of Care (Signed)
Patient discharged home in stable condition. He has not passed gas since surgery, PA is aware and told him it was okay for him to go home if he wanted

## 2021-01-05 NOTE — Progress Notes (Signed)
1 Day Post-Op   Subjective/Chief Complaint: Doing well sitting in chair Min soreness   Objective: Vital signs in last 24 hours: Temp:  [98 F (36.7 C)-99.1 F (37.3 C)] 98.9 F (37.2 C) (01/21 0536) Pulse Rate:  [61-92] 79 (01/21 0536) Resp:  [11-18] 16 (01/21 0536) BP: (124-146)/(80-96) 124/82 (01/21 0536) SpO2:  [99 %-100 %] 100 % (01/21 0536) Weight:  [62 kg] 62 kg (01/20 1141) Last BM Date: 01/01/21  Intake/Output from previous day: 01/20 0701 - 01/21 0700 In: 3047.3 [I.V.:2947.3; IV Piggyback:100] Out: 086 [Urine:825; Blood:50] Intake/Output this shift: No intake/output data recorded.  General appearance: alert and cooperative GI: soft, non-tender; bowel sounds normal; no masses,  no organomegaly and inc c/d/i  Lab Results:  Recent Labs    01/04/21 0346 01/05/21 0341  WBC 8.8 9.4  HGB 9.4* 8.8*  HCT 29.7* 28.1*  PLT 159 160   BMET Recent Labs    01/04/21 0346 01/04/21 1029 01/05/21 0341  NA 143  --  143  K 2.8* 3.3* 3.3*  CL 112*  --  111  CO2 23  --  24  GLUCOSE 119*  --  117*  BUN 7*  --  10  CREATININE 0.89  --  0.98  CALCIUM 8.1*  --  8.0*   Anti-infectives: Anti-infectives (From admission, onward)   Start     Dose/Rate Route Frequency Ordered Stop   01/04/21 0600  ceFAZolin (ANCEF) IVPB 2g/100 mL premix        2 g 200 mL/hr over 30 Minutes Intravenous On call to O.R. 01/03/21 1226 01/04/21 1308      Assessment/Plan: s/p Procedure(s): XI ROBOTIC ASSISTED HIATAL HERNIA REPAIR AND FUNDOPLICATION WITH MESH (N/A) await DG esoph today  If all looks OK with XRAY we will start liquid diet  Likely home later this PM   LOS: 4 days    Ralene Ok 01/05/2021

## 2021-01-13 ENCOUNTER — Other Ambulatory Visit (HOSPITAL_COMMUNITY): Payer: Federal, State, Local not specified - PPO

## 2021-01-18 ENCOUNTER — Other Ambulatory Visit: Payer: Self-pay | Admitting: Gastroenterology

## 2021-01-19 NOTE — Discharge Summary (Signed)
[facility] Discharge Summary   Patient ID: Darrell Hoffman. MRN: [id number] DOB/AGE: Jul 25, 1951 70 y.o.  Admit date: 01/01/2021 Discharge date: 01/05/2021  Admitting Diagnosis: Hiatal hernia   Discharge Diagnosis Patient Active Problem List   Diagnosis Date Noted  . Malnutrition of moderate degree 01/05/2021  . Gastric outlet obstruction 01/01/2021  . Hiatal hernia with obstruction but no gangrene 01/01/2021  . Preoperative cardiovascular examination 11/21/2020  . Bilateral leg pain 08/23/2020  . Dysphagia 08/09/2020  . Tetrahydrocannabinol (THC) use disorder, moderate, dependence ([place]) 08/09/2020  . Weight loss 06/07/2020  . Vitamin D deficiency 06/07/2020  . Cough 02/10/2019  . Subacromial bursitis of left shoulder joint 01/19/2019  . CKD (chronic kidney disease) stage 3, GFR 30-59 ml/min (HCC) 03/31/2018  . Hyperglycemia 09/25/2017  . Daytime somnolence 09/25/2017  . Finger pain, right 03/27/2017  . History of colonic polyps 03/27/2016  . Pre-ulcerative corn or callous 03/27/2016  . Skin nodule 01/10/2016  . Venereal disease, unspecified 03/23/2012  . Renal insufficiency 04/02/2011  . Right elbow pain 04/02/2011  . Encounter for well adult exam with abnormal findings 03/30/2011  . ANEMIA-B12 DEFICIENCY 12/04/2009  . ANEMIA-IRON DEFICIENCY 11/27/2009  . B12 deficiency 10/06/2008  . KNEE PAIN, RIGHT 10/08/2007  . Hyperlipidemia 10/03/2007  . Morbid obesity ([place]) 10/03/2007  . ERECTILE DYSFUNCTION 10/03/2007  . Essential hypertension 10/03/2007  . ALLERGIC RHINITIS 10/03/2007  . Asthma 10/03/2007  . GERD 10/03/2007  . ALCOHOL ABUSE, HX OF 10/03/2007  . COLONIC POLYPS, HX OF 10/03/2007    Consultants None   Procedures Dr. Ralene Ok (01/04/21) - XI ROBOTIC ASSISTED HIATAL HERNIA REPAIR AND TOUPET  FUNDOPLICATION WITH MESH (N/A)  HPI:  69 yom who has known type IV hiatal hernia seen by Dr Rosendo Gros in October. Was undergoing evaluation and needed  cardiology eval which he completed and was due to robotic [place] repair 2 February. He has had difficulty with bms past several days and using suppositories.  For past 24 hours he has not been able to tolerate anything oral and has been vomiting.  He went to [facility] and had imaging that shows stomach in right chest. Does not appear to have a complication from [place].  He had ng placed there which he states had output initially and feels better.  He was transferred to [facility] .  Hospital Course:  Patient was admitted with NG tube in place. He underwent the above repair of his hiatal hernia by Dr. Rosendo Gros. On POD#1, the patient was voiding well, tolerating full liquid diet, ambulating well, pain well controlled, vital signs stable, incisions c/d/i and felt stable for discharge home.  Patient will follow up in our office  and knows to call with questions or concerns.  He will call to confirm appointment date/time.     Allergies as of 01/05/2021   No Known Allergies     Medication List    TAKE these medications   acetaminophen 325 MG tablet Commonly known as: TYLENOL Take 2-3 tablets (650-975 mg total) by mouth every 6 (six) hours as needed for mild pain, moderate pain or fever (post-op pain).   albuterol 108 (90 Base) MCG/ACT inhaler Commonly known as: Proventil HFA Inhale 2 puffs into the lungs every 4 (four) hours as needed. What changed: reasons to take this   Aspirin Low Dose 81 MG EC tablet Generic drug: aspirin TAKE 1 TABLET(81 MG) BY MOUTH DAILY. SWALLOW WHOLE   Cialis 20 MG tablet Generic drug: tadalafil TAKE 1 TABLET BY MOUTH DAILY  AS NEEDED   fluticasone 50 MCG/ACT nasal spray Commonly known as: FLONASE Place 2 sprays into both nostrils daily.   glycerin adult 2 g suppository Place 1 suppository rectally daily as needed for constipation.   metoCLOPramide 5 MG tablet Commonly known as: Reglan Take 1 tablet (5 mg total) by mouth 4 (four) times daily -  before meals and at bedtime. What  changed: when to take this   vitamin B-12 1000 MCG tablet Commonly known as: CYANOCOBALAMIN Take 1 tablet (1,000 mcg total) by mouth daily.   Wixela Inhub 500-50 MCG/DOSE Aepb Generic drug: Fluticasone-Salmeterol INHALE 1 PUFF INTO THE LUNGS TWICE DAILY What changed: See the new instructions.         Follow-up Information    Ralene Ok, MD. Schedule an appointment as soon as possible for a visit in 2 weeks.   Specialty: General Surgery Why: Post op visit Contact information: [address] [place] [address] [phone]               Signed: Obie Dredge, [facility] 01/19/2021, 10:13 AM

## 2021-02-05 NOTE — Progress Notes (Signed)
NEUROLOGY CONSULTATION NOTE  Darrell Hoffman. MRN: [id number] DOB: [birth year]  Referring provider: Cathlean Cower, MD Primary care provider: Cathlean Cower, MD  Reason for consult:  Bilateral leg pain  Assessment/Plan:   Peripheral neuropathy.  Does not appear to be consistent with lumbar stenosis.  1.  Workup for neuropathy: -  Labs:  ANA with reflex, sed rate, B1, B6, B12, TSH, ACE, SPEP/IFE - NCV-EMG  2.  For pain, start gabapentin 100mg  at bedtime and titrate to 300mg  at bedtime 3.  Follow up after testing.   Subjective:  Darrell Hoffman is a 70 year old right-handed male with HTN, CKD, anemia, and history of alcohol abuse who presents for bilateral leg pain.  History supplemented by referring provider's note.  He has history of lower extremity pain for several months. Workup for PAD was negative.  History of B12 deficiency on supplements with level from December 561.  Hgb A1c was 5.7 and TSH 2.11.  In August 2021, underwent labs for neuromuscular disorder - AChR antibody negative, CK was 251, aldolase 2.4, and sed rate 22.  Pain significantly worsened after hiatal hernia repair in January.  Initially feet feel cold.  Pain in arches and inbetween sharp pain sharp in legs - wakes up in night - can't sleep - no numbness and tingling - OTC ointment helps short term and massage - not notice while on feet or walking.  Weight loss over last year hiatal hernia weakness - no back sometimes pain tingling in hands.       PAST MEDICAL HISTORY: Past Medical History:  Diagnosis Date  . ALCOHOL ABUSE, HX OF 10/03/2007  . ALLERGIC RHINITIS 10/03/2007  . ANEMIA-B12 DEFICIENCY 12/04/2009  . ANEMIA-IRON DEFICIENCY 11/27/2009  . ANEMIA-NOS 10/08/2007  . ASTHMA 10/03/2007  . Asthma   . B12 DEFICIENCY 10/06/2008  . CKD (chronic kidney disease) stage 3, GFR 30-59 ml/min (HCC) 03/31/2018  . COLONIC POLYPS, HX OF 10/03/2007  . ERECTILE DYSFUNCTION 10/03/2007  . GERD 10/03/2007  . History of hiatal  hernia    found on CXT in June 2021  . HYPERLIPIDEMIA 10/03/2007  . HYPERTENSION 10/03/2007  . KNEE PAIN, RIGHT 10/08/2007  . Morbid obesity ([place]) 10/03/2007  . Neuromuscular disorder ([place])    per pt. never diagnosed  . Substance abuse ([place])    marijuana daily    PAST SURGICAL HISTORY: Past Surgical History:  Procedure Laterality Date  . bilateral hydrocele repair    . COLONOSCOPY  06/06/2016  . POLYPECTOMY  01-03-2010  . s/p left knee arthroscopy  2009  . s/p TKR  03/2009  . VASECTOMY    . XI ROBOTIC ASSISTED HIATAL HERNIA REPAIR N/A 01/04/2021   Procedure: XI ROBOTIC ASSISTED HIATAL HERNIA REPAIR AND FUNDOPLICATION WITH MESH;  Surgeon: Ralene Ok, MD;  Location: [facility];  Service: General;  Laterality: N/A;    MEDICATIONS: Current Outpatient Medications on File Prior to Visit  Medication Sig Dispense Refill  . acetaminophen (TYLENOL) 325 MG tablet Take 2-3 tablets (650-975 mg total) by mouth every 6 (six) hours as needed for mild pain, moderate pain or fever (post-op pain).    Marland Kitchen albuterol (PROVENTIL HFA) 108 (90 Base) MCG/ACT inhaler Inhale 2 puffs into the lungs every 4 (four) hours as needed. (Patient taking differently: Inhale 2 puffs into the lungs every 4 (four) hours as needed for wheezing or shortness of breath.) 18 g 2  . ASPIRIN LOW DOSE 81 MG EC tablet TAKE 1 TABLET(81 MG) BY MOUTH DAILY.  SWALLOW WHOLE (Patient not taking: Reported on 01/01/2021) 90 tablet 2  . CIALIS 20 MG tablet TAKE 1 TABLET BY MOUTH DAILY AS NEEDED (Patient not taking: Reported on 01/01/2021) 5 tablet 11  . fluticasone (FLONASE) 50 MCG/ACT nasal spray Place 2 sprays into both nostrils daily. (Patient not taking: Reported on 01/01/2021) 16 g 5  . glycerin adult 2 g suppository Place 1 suppository rectally daily as needed for constipation.    . metoCLOPramide (REGLAN) 5 MG tablet Take 1 tablet (5 mg total) by mouth 4 (four) times daily -  before meals and at bedtime. (Patient taking differently: Take 5  mg by mouth in the morning, at noon, in the evening, and at bedtime.) 120 tablet 1  . omeprazole (PRILOSEC) 40 MG capsule TAKE 1 CAPSULE(40 MG) BY MOUTH IN THE MORNING AND AT BEDTIME 60 capsule 2  . vitamin B-12 (CYANOCOBALAMIN) 1000 MCG tablet Take 1 tablet (1,000 mcg total) by mouth daily. 90 tablet 3  . WIXELA INHUB 500-50 MCG/DOSE AEPB INHALE 1 PUFF INTO THE LUNGS TWICE DAILY (Patient taking differently: Inhale 1 puff into the lungs 2 (two) times daily.) 60 each 11   No current facility-administered medications on file prior to visit.    ALLERGIES: No Known Allergies  FAMILY HISTORY: Family History  Problem Relation Age of Onset  . Breast cancer Mother   . Lupus Sister   . Hypertension Sister   . Diabetes Sister   . Alcohol abuse Paternal Uncle   . Coronary artery disease Other        male 1st degree relative - MI at 69 yo  . Diabetes Cousin        2 cousins  . Colon cancer Neg Hx   . Esophageal cancer Neg Hx   . Rectal cancer Neg Hx   . Stomach cancer Neg Hx   . Colon polyps Neg Hx     SOCIAL HISTORY: Social History   Socioeconomic History  . Marital status: Divorced    Spouse name: Not on file  . Number of children: 2  . Years of education: Not on file  . Highest education level: Not on file  Occupational History  . Occupation: Research officer, political party: [employer]  Tobacco Use  . Smoking status: Current Some Day Smoker  . Smokeless tobacco: Never Used  Vaping Use  . Vaping Use: Never used  Substance and Sexual Activity  . Alcohol use: Not Currently    Alcohol/week: 0.0 standard drinks  . Drug use: Yes    Types: Marijuana    Comment: marijuana daily  . Sexual activity: Not on file  Other Topics Concern  . Not on file  Social History Narrative  . Not on file   Social Determinants of Health   Financial Resource Strain: Not on file  Food Insecurity: Not on file  Transportation Needs: Not on file  Physical Activity: Not on file   Stress: Not on file  Social Connections: Not on file  Intimate Partner Violence: Not on file    Objective:  Blood pressure 135/80, pulse 84, resp. rate 18, height 5' 6.5" (1.689 m), weight 147 lb (66.7 kg), SpO2 99 %. General: No acute distress.  Patient appears well-groomed.   Head:  Normocephalic/atraumatic Eyes:  fundi examined but not visualized Neck: supple, no paraspinal tenderness, full range of motion Back: No paraspinal tenderness Heart: regular rate and rhythm Lungs: Clear to auscultation bilaterally. Vascular: No carotid bruits. Neurological Exam: Mental status:  alert and oriented to person, place, and time, recent and remote memory intact, fund of knowledge intact, attention and concentration intact, speech fluent and not dysarthric, language intact. Cranial nerves: CN I: not tested CN II: pupils equal, round and reactive to light, visual fields intact CN III, IV, VI:  full range of motion, no nystagmus, no ptosis CN V: facial sensation intact. CN VII: upper and lower face symmetric CN VIII: hearing intact CN IX, X: gag intact, uvula midline CN XI: sternocleidomastoid and trapezius muscles intact CN XII: tongue midline Bulk & Tone: normal, no fasciculations. Motor:  muscle strength 5/5 throughout Sensation:  Pinprick sensation reduced up to just below knees, and vibratory sensation slightly reduced in toes. Deep Tendon Reflexes:  2+ throughout,  toes downgoing.   Finger to nose testing:  Without dysmetria.   Heel to shin:  Without dysmetria.   Gait:  Antalgic gait.  Able to turn.  Some difficulty tandem walking.  Romberg with sway.     Thank you for allowing me to take part in the care of this patient.  Metta Clines, DO  CC: Cathlean Cower, MD

## 2021-02-06 ENCOUNTER — Other Ambulatory Visit: Payer: Self-pay

## 2021-02-06 ENCOUNTER — Encounter: Payer: Self-pay | Admitting: Neurology

## 2021-02-06 ENCOUNTER — Ambulatory Visit: Payer: Federal, State, Local not specified - PPO | Admitting: Neurology

## 2021-02-06 ENCOUNTER — Other Ambulatory Visit (INDEPENDENT_AMBULATORY_CARE_PROVIDER_SITE_OTHER): Payer: Federal, State, Local not specified - PPO

## 2021-02-06 VITALS — BP 135/80 | HR 84 | Resp 18 | Ht 66.5 in | Wt 147.0 lb

## 2021-02-06 DIAGNOSIS — G629 Polyneuropathy, unspecified: Secondary | ICD-10-CM

## 2021-02-06 LAB — VITAMIN B12: Vitamin B-12: 313 pg/mL (ref 211–911)

## 2021-02-06 MED ORDER — GABAPENTIN 100 MG PO CAPS: ORAL_CAPSULE | ORAL | 0 refills | Status: DC

## 2021-02-06 NOTE — Patient Instructions (Addendum)
1.  We will check a nerve conduction study in the legs 2.  We will check blood work:  ANA with reflex, sed rate, B1, B6, B12, TSH, ACE, SPEP/IFE 3.  For pain, start gabapentin 100mg  at bedtime for one week, then 200mg  at bedtime for one week, then 300mg  at bedtime 4.  Follow up after testing.  Your provider has requested that you have labwork completed today. Please go to [facility] ([id number]) on the second floor of this building before leaving the office today. You do not need to check in. If you are not called within 15 minutes please check with the front desk.

## 2021-02-07 LAB — ANA W/REFLEX: Anti Nuclear Antibody (ANA): NEGATIVE

## 2021-02-09 LAB — ANGIOTENSIN CONVERTING ENZYME: Angiotensin-Converting Enzyme: 18 U/L (ref 9–67)

## 2021-02-09 LAB — IMMUNOFIXATION ELECTROPHORESIS
IgG (Immunoglobin G), Serum: 981 mg/dL (ref 600–1540)
IgM, Serum: 28 mg/dL — ABNORMAL LOW (ref 50–300)
Immunoglobulin A: 263 mg/dL (ref 70–320)

## 2021-02-09 LAB — PROTEIN ELECTROPHORESIS, SERUM
Albumin ELP: 3.5 g/dL — ABNORMAL LOW (ref 3.8–4.8)
Alpha 1: 0.3 g/dL (ref 0.2–0.3)
Alpha 2: 0.6 g/dL (ref 0.5–0.9)
Beta 2: 0.3 g/dL (ref 0.2–0.5)
Beta Globulin: 0.4 g/dL (ref 0.4–0.6)
Gamma Globulin: 0.8 g/dL (ref 0.8–1.7)
Total Protein: 6 g/dL — ABNORMAL LOW (ref 6.1–8.1)

## 2021-02-09 LAB — VITAMIN B1: Vitamin B1 (Thiamine): 22 nmol/L (ref 8–30)

## 2021-02-26 ENCOUNTER — Telehealth: Payer: Self-pay | Admitting: Neurology

## 2021-02-26 MED ORDER — GABAPENTIN 300 MG PO CAPS: 300.0000 mg | ORAL_CAPSULE | Freq: Every day | ORAL | 1 refills | Status: DC

## 2021-02-26 NOTE — Telephone Encounter (Signed)
Yes, please send script in for gabapentin 300mg  at bedtime

## 2021-02-26 NOTE — Telephone Encounter (Signed)
Called patient and informed him that new Rx for Gabapentin 300 mg was sent to the pharmacy. Patient was instructed to take one capsule at bedtime. Patient verbalized understanding and had no further questions or concerns.

## 2021-02-26 NOTE — Telephone Encounter (Signed)
Patient called in stating he needs a refill on his gabapentin if Dr. Tomi Likens wants him to continue taking it?

## 2021-03-12 ENCOUNTER — Other Ambulatory Visit: Payer: Self-pay | Admitting: Neurology

## 2021-03-13 ENCOUNTER — Other Ambulatory Visit: Payer: Self-pay

## 2021-03-13 ENCOUNTER — Ambulatory Visit: Payer: Federal, State, Local not specified - PPO | Admitting: Neurology

## 2021-03-13 DIAGNOSIS — G629 Polyneuropathy, unspecified: Secondary | ICD-10-CM | POA: Diagnosis not present

## 2021-03-13 DIAGNOSIS — G610 Guillain-Barre syndrome: Secondary | ICD-10-CM

## 2021-03-13 NOTE — Procedures (Signed)
[facility]  [address], [address]  [place], [place] [address] Tel: [phone] Fax:  [phone] Test Date:  03/13/2021  Patient: Darrell Hoffman DOB: [birth year] Physician: Narda Amber, DO  Sex: Male Height: 5\' 6"  Ref Phys: Metta Clines, D.O.  ID#: [id number]   Technician:    Patient Complaints: This is a 70 year old man referred for evaluation of bilateral leg paresthesias.  NCV & EMG Findings: Extensive electrodiagnostic testing of the right lower extremity and additional studies of the left shows:  1. Bilateral superficial peroneal sensory responses are absent.  Bilateral sural sensory responses are within normal limits. 2. Bilateral peroneal motor responses absent at the extensor digitorum brevis, and showed reduced amplitude at the tibialis anterior (R2.7, 2.8 mV).  Bilateral tibial motor responses show reduced amplitude (L3.5, R2.5 mV) and decreased conduction velocity (Knee-Ankle, L36, R32 m/s).   3. Bilateral tibial H reflex studies are absent. 4. Active on chronic motor axonal loss changes are seen nearly all the tested muscles of the lower extremities, except gluteus medius.  There is evidence of fibrillation potentials involving the lower paraspinal muscles.    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   ___________________________ Narda Amber, DO    Nerve Conduction Studies Anti Sensory Summary Table   Stim Site NR Peak (ms) Norm Peak (ms) P-T Amp (V) Norm P-T Amp  Left Sup Peroneal Anti Sensory (Ant Lat Mall)  31C  12 cm NR  <4.6  >3  Right Sup Peroneal Anti Sensory (Ant Lat Mall)  31C  12 cm NR  <4.6  >3  Left Sural Anti Sensory (Lat Mall)  31C  Calf    3.2 <4.6 11.7 >3  Right Sural Anti Sensory  (Lat Mall)  31C  Calf    4.2 <4.6 10.3 >3   Motor Summary Table   Stim Site NR Onset (ms) Norm Onset (ms) O-P Amp (mV) Norm O-P Amp Site1 Site2 Delta-0 (ms) Dist (cm) Vel (m/s) Norm Vel (m/s)  Left Peroneal Motor (Ext Dig Brev)  31C  Ankle NR  <6.0  >2.5 B Fib Ankle  0.0  >40  B Fib NR     Poplt B Fib  0.0  >40  Poplt NR            Right Peroneal Motor (Ext Dig Brev)  31C  Ankle NR  <6.0  >2.5 B Fib Ankle  0.0  >40  B Fib NR     Poplt B Fib  0.0  >40  Poplt NR            Left Peroneal TA Motor (Tib Ant)  31C  Fib Head    4.2 <4.5 2.8 >3 Poplit Fib Head 1.5 8.0 53 >40  Poplit    5.7  2.6         Right Peroneal TA Motor (Tib Ant)  31C  Fib Head    3.8 <4.5 2.7 >3 Poplit Fib Head 1.4 8.0 57 >40  Poplit    5.2  2.5         Left Tibial Motor (Abd Hall Brev)  31C  Ankle    5.6 <6.0 3.5 >4 Knee Ankle 11.3 41.0 36 >40  Knee    16.9  2.2         Right Tibial Motor (Abd Hall Brev)  31C  Ankle    5.5 <6.0 2.5 >4 Knee Ankle 12.5 40.0 32 >40  Knee    18.0  1.6          H Reflex Studies   NR H-Lat (ms) Lat Norm (ms) L-R H-Lat (ms)  Left Tibial (Gastroc)  31C  NR  <35   Right Tibial (Gastroc)  31C  NR  <35    EMG   Side Muscle Ins Act Fibs Psw Fasc Number Recrt Dur Dur. Amp Amp. Poly Poly. Comment  Right BicepsFemS Nml 1+ Nml Nml Nml Nml Nml Nml Nml Nml Nml Nml N/A  Right Flex Dig Long Nml Nml Nml Nml 2- Rapid Many 1+ Many 1+ Many 1+ N/A  Right Gastroc Nml 1+ Nml Nml 2- Rapid Many 1+ Many 1+ Many 1+ N/A  Right RectFemoris Nml Nml Nml Nml 1- Rapid Some 1+ Some 1+ Some 1+ N/A  Right GluteusMed Nml Nml Nml Nml Nml Nml Nml Nml Nml Nml Nml Nml N/A  Right Lumbo Parasp Low Nml 1+ Nml Nml NE - - - - - - Nml N/A  Right AntTibialis Nml 1+ Nml Nml 2- Rapid Many 1+ Many 1+ Many 1+ N/A  Right AdductorLong Nml 1+ Nml Nml 1- Rapid Many 1+ Many 1+ Many 1+ N/A  Left AntTibialis Nml 1+ Nml Nml 2- Rapid Many 1+ Many 1+ Many 1+ N/A  Left Gastroc Nml 1+ Nml Nml 2- Rapid Many 1+ Many 1+ Many 1+  N/A  Left RectFemoris Nml Nml Nml Nml 1- Rapid Some 1+ Some 1+ Some 1+ N/A  Left GluteusMed Nml Nml Nml Nml Nml Nml Nml Nml Nml Nml Nml Nml N/A  Left AdductorLong Nml 1+ Nml Nml 1- Rapid Some 1+ Some 1+ Some 1+ N/A  Left BicepsFemS Nml 1+ Nml Nml Nml Nml Nml Nml Nml Nml Nml Nml N/A      Waveforms:

## 2021-03-15 ENCOUNTER — Other Ambulatory Visit: Payer: Self-pay | Admitting: Neurology

## 2021-03-15 ENCOUNTER — Telehealth: Payer: Self-pay | Admitting: Neurology

## 2021-03-15 DIAGNOSIS — G619 Inflammatory polyneuropathy, unspecified: Secondary | ICD-10-CM

## 2021-03-15 MED ORDER — GABAPENTIN 300 MG PO CAPS: 300.0000 mg | ORAL_CAPSULE | Freq: Two times a day (BID) | ORAL | 1 refills | Status: DC

## 2021-03-15 NOTE — Telephone Encounter (Signed)
I called and spoke with Mr. Parady about the nerve study - findings suggestive of an inflammatory polyneuropathy.  Therefore, we will need to order a lumbar puncture for CSF analysis, checking spinal fluid for cell count, protein, glucose, gram stain and culture, oligoclonal bands, IgG index, Lyme and ACE.  I have already increased gabapentin from 300mg  at bedtime to 300mg  twice daily to help address his pain during the day.

## 2021-03-16 NOTE — Telephone Encounter (Signed)
LP ordered. Order form filled out and faxed over to [facility].

## 2021-03-16 NOTE — Addendum Note (Signed)
Addended by: Venetia Night on: 03/16/2021 08:37 AM   Modules accepted: Orders

## 2021-03-22 ENCOUNTER — Telehealth: Payer: Self-pay | Admitting: Neurology

## 2021-03-22 DIAGNOSIS — G619 Inflammatory polyneuropathy, unspecified: Secondary | ICD-10-CM

## 2021-03-22 NOTE — Telephone Encounter (Signed)
Checked LP order and [facility] stated "03/22/21 PT AWARE WE WILL NEED CT OR MRI HEAD BEFORE SCHEDULING-PT TO CONTACT DR TO LET KNOW/CLC"

## 2021-03-22 NOTE — Telephone Encounter (Signed)
Patient called in stating [facility] stated he needed some other kind of testing before they could do a lumbar puncture.

## 2021-03-22 NOTE — Telephone Encounter (Signed)
CT head without contrast

## 2021-03-23 NOTE — Telephone Encounter (Signed)
CT order placed

## 2021-03-29 ENCOUNTER — Ambulatory Visit
Admission: RE | Admit: 2021-03-29 | Discharge: 2021-03-29 | Disposition: A | Discharge status: Home / Self Care | Payer: Federal, State, Local not specified - PPO | Source: Ambulatory Visit | Attending: Neurology | Admitting: Neurology

## 2021-03-29 DIAGNOSIS — G619 Inflammatory polyneuropathy, unspecified: Secondary | ICD-10-CM | POA: Diagnosis not present

## 2021-03-29 DIAGNOSIS — J3489 Other specified disorders of nose and nasal sinuses: Secondary | ICD-10-CM | POA: Diagnosis not present

## 2021-04-02 ENCOUNTER — Telehealth: Payer: Self-pay | Admitting: Neurology

## 2021-04-02 ENCOUNTER — Encounter: Payer: Self-pay | Admitting: Neurology

## 2021-04-02 MED ORDER — GABAPENTIN ENACARBIL ER 600 MG PO TBCR: 600.0000 mg | EXTENDED_RELEASE_TABLET | Freq: Two times a day (BID) | ORAL | 2 refills | Status: DC

## 2021-04-02 NOTE — Telephone Encounter (Signed)
Per pt Pharmacy the system had the script in as a one day supply.    Per Dr.Jaffe last note 03/15/21, Gabapentin #1 300 cap BID sent to the pharmacy.    Per pt he states that instead of taking the one cap BID he was advised on 03/15/21, He  increased himself to #2 300 mg cap BID. And this works for him.    Please advise.

## 2021-04-02 NOTE — Telephone Encounter (Signed)
Patient called and left a message requesting a call back from a nurse. He said the pharmacy told him there is a problem with his medication.

## 2021-04-02 NOTE — Telephone Encounter (Signed)
Pt advised, new script sent. Old order #2 300 mg cap BID cancelled at [facility]

## 2021-04-02 NOTE — Telephone Encounter (Signed)
If 600mg  twice daily is working for him and he is tolerating, then we can prescribe gabapentin 600mg  twice daily

## 2021-04-05 ENCOUNTER — Telehealth: Payer: Self-pay | Admitting: Neurology

## 2021-04-05 ENCOUNTER — Ambulatory Visit
Admission: RE | Admit: 2021-04-05 | Discharge: 2021-04-05 | Disposition: A | Discharge status: Home / Self Care | Payer: Federal, State, Local not specified - PPO | Source: Ambulatory Visit | Attending: Neurology | Admitting: Neurology

## 2021-04-05 ENCOUNTER — Other Ambulatory Visit (HOSPITAL_COMMUNITY)
Admission: RE | Admit: 2021-04-05 | Discharge: 2021-04-05 | Disposition: A | Discharge status: Home / Self Care | Payer: Federal, State, Local not specified - PPO | Source: Ambulatory Visit | Attending: Neurology | Admitting: Neurology

## 2021-04-05 ENCOUNTER — Other Ambulatory Visit: Payer: Self-pay

## 2021-04-05 VITALS — BP 138/87 | HR 59

## 2021-04-05 DIAGNOSIS — M79605 Pain in left leg: Secondary | ICD-10-CM | POA: Diagnosis not present

## 2021-04-05 DIAGNOSIS — M79604 Pain in right leg: Secondary | ICD-10-CM | POA: Diagnosis not present

## 2021-04-05 DIAGNOSIS — G619 Inflammatory polyneuropathy, unspecified: Secondary | ICD-10-CM

## 2021-04-05 NOTE — Discharge Instructions (Signed)

## 2021-04-05 NOTE — Progress Notes (Signed)
24ml of blood drawn from pts LAC to be sent with LP lab work. 1 successful attempt. Pt tolerated well. Gauze and tape applied after.

## 2021-04-05 NOTE — Telephone Encounter (Signed)
LMOM with info that PA was submitted and we are waiting to hear back from insurance on determination. Usually takes about 3-5 business days.

## 2021-04-05 NOTE — Telephone Encounter (Signed)
We do not have samples of gabapentin.  We need to get the PA through.  I don't know why he needs a PA for gabapentin.  I increased the dose, which is why he needed a sooner prescription

## 2021-04-05 NOTE — Telephone Encounter (Signed)
Patient called back and wanted to know if there was any samples or anything for him to take in the mean time for the pain while waiting on insurance to approve the PA. Do we have any? Thanks.

## 2021-04-06 MED ORDER — GABAPENTIN 600 MG PO TABS: 600.0000 mg | ORAL_TABLET | Freq: Two times a day (BID) | ORAL | 2 refills | Status: DC

## 2021-04-06 NOTE — Telephone Encounter (Signed)
Per the pharmacy it is the cost of the medication. So the PA will lower the cost.   New script sent per pharmacy this one should be cheaper.    Pt advised.

## 2021-04-09 LAB — CYTOLOGY - NON PAP

## 2021-04-11 LAB — LEUKEMIA/LYMPHOMA EVALUATION PANEL: Clinical Information:: 48803833

## 2021-04-11 LAB — CSF CULTURE W GRAM STAIN
MICRO NUMBER:: 11797744
Result:: NO GROWTH
SPECIMEN QUALITY:: ADEQUATE

## 2021-04-11 LAB — CSF CELL COUNT WITH DIFFERENTIAL
RBC Count, CSF: 1 cells/uL — ABNORMAL HIGH
WBC, CSF: 1 cells/uL (ref 0–5)

## 2021-04-11 LAB — GLUCOSE, CSF: Glucose, CSF: 49 mg/dL (ref 40–80)

## 2021-04-11 LAB — CNS IGG SYNTHESIS RATE, CSF+BLOOD
Albumin Serum: 3.7 g/dL (ref 3.2–4.6)
Albumin, CSF: 24.8 mg/dL (ref 8.0–42.0)
CNS-IgG Synthesis Rate: -3.2 mg/24 h (ref <=3.3)
IgG (Immunoglobin G), Serum: 980 mg/dL (ref 600–1540)
IgG Total CSF: 3 mg/dL (ref 0.8–7.7)
IgG-Index: 0.46 (ref <0.66)

## 2021-04-11 LAB — PROTEIN, CSF: Total Protein, CSF: 39 mg/dL (ref 15–60)

## 2021-04-11 LAB — LYME DISEASE ABS IGG, IGM, IFA, CSF
Lyme Disease AB (IgG), IBL: NOT DETECTED
Lyme Disease AB (IgM), IBL: NOT DETECTED

## 2021-04-11 LAB — OLIGOCLONAL BANDS, CSF + SERM

## 2021-04-11 LAB — ANGIOTENSIN CONVERTING ENZYME, CSF: ANGIOTENSIN CONVERTING ENZYME ( ACE) CSF: 5 U/L (ref <=15)

## 2021-04-11 LAB — BORRELIA SPECIES DNA, FLUID, PCR: B. burgdorferi DNA: NOT DETECTED

## 2021-04-16 ENCOUNTER — Other Ambulatory Visit: Payer: Self-pay | Admitting: Gastroenterology

## 2021-04-19 ENCOUNTER — Telehealth: Payer: Self-pay | Admitting: Neurology

## 2021-04-19 NOTE — Telephone Encounter (Signed)
[phone] 

## 2021-04-19 NOTE — Telephone Encounter (Signed)
Patient is requesting LP results.please advise on follow up. Had this done on 04/05/2021

## 2021-04-19 NOTE — Telephone Encounter (Signed)
New message   Patient at work now. Call after 3:30pm  Patient calling checking on the status of lumbar puncture.

## 2021-04-20 NOTE — Telephone Encounter (Signed)
Left message

## 2021-04-20 NOTE — Telephone Encounter (Signed)
I left a message for patient to call back.  The spinal tap results were essentially normal.  However, we can still treat for possible inflammatory neuropathy with IV steroids.  If agreeable, we can set him up for outpatient Solu-Medrol 1 gram IV daily for 5 days.  I would like to re-evaluate him in 2 months to see if he notes any improvement in the nerve symptoms.

## 2021-04-20 NOTE — Telephone Encounter (Signed)
Patient is going to decide over the weekend.contact the office back to let us know.

## 2021-05-09 ENCOUNTER — Other Ambulatory Visit: Payer: Self-pay | Admitting: Student

## 2021-05-09 DIAGNOSIS — Z9889 Other specified postprocedural states: Secondary | ICD-10-CM

## 2021-05-11 ENCOUNTER — Ambulatory Visit (HOSPITAL_COMMUNITY)
Admission: RE | Admit: 2021-05-11 | Discharge: 2021-05-11 | Disposition: A | Discharge status: Home / Self Care | Payer: Federal, State, Local not specified - PPO | Source: Ambulatory Visit | Attending: Student | Admitting: Student

## 2021-05-11 ENCOUNTER — Other Ambulatory Visit: Payer: Self-pay

## 2021-05-11 DIAGNOSIS — Z9889 Other specified postprocedural states: Secondary | ICD-10-CM | POA: Diagnosis not present

## 2021-05-11 DIAGNOSIS — K224 Dyskinesia of esophagus: Secondary | ICD-10-CM | POA: Diagnosis not present

## 2021-05-11 DIAGNOSIS — R1310 Dysphagia, unspecified: Secondary | ICD-10-CM | POA: Diagnosis not present

## 2021-05-25 ENCOUNTER — Telehealth: Payer: Self-pay | Admitting: Neurology

## 2021-05-25 MED ORDER — GABAPENTIN 600 MG PO TABS: 600.0000 mg | ORAL_TABLET | Freq: Three times a day (TID) | ORAL | 2 refills | Status: DC

## 2021-05-25 NOTE — Telephone Encounter (Signed)
Patient has a appt to see Dr Tomi Likens on 08-15-21 @ 11:10.  He has questions about medication and refills and what is going with him please call

## 2021-05-25 NOTE — Telephone Encounter (Signed)
Telephone call to pt, pt states he wants to increase the Gabapentin 600 X2 time to 3 X  day times    Front per [facility] last note pt has to be seen In two months from May. Do we have any thing?    Per Dr.Jaffe okay to have Gabapentin increased to 600 mg TID.

## 2021-05-28 NOTE — Telephone Encounter (Signed)
Patient is sch 08-15-21 do we need to move to a work in in July. Darrell Hoffman has appt open as of now on 07-11-21.  Please advise

## 2021-05-28 NOTE — Telephone Encounter (Signed)
Pr pt on 05/25/21 he never called [place] back to schedule to have Solu-Medrol 1 gram IV daily for 5 days.  He did not understand why he needed it. He wanted to know what his next steps are after the Spinal tap.    Advised pt the gabapentin was increase per Jaffe to 600 Mg TID.  Follow up is scheduled for August.   Per pt he wanted to know why the follow is scheduled so far out when it needed to be 2 months from the LP.   Please advise if we can move appt up

## 2021-05-28 NOTE — Telephone Encounter (Signed)
LMOVM pt to do steroid IV first then f/u to months from then.

## 2021-05-29 ENCOUNTER — Other Ambulatory Visit: Payer: Self-pay | Admitting: Neurology

## 2021-05-29 DIAGNOSIS — G629 Polyneuropathy, unspecified: Secondary | ICD-10-CM | POA: Insufficient documentation

## 2021-05-29 NOTE — Telephone Encounter (Signed)
LMoVM, [facility] should be given a call to schedule.

## 2021-05-29 NOTE — Telephone Encounter (Signed)
Patient called to check on the status of the call. 

## 2021-05-30 ENCOUNTER — Other Ambulatory Visit: Payer: Self-pay | Admitting: Pharmacy Technician

## 2021-06-04 ENCOUNTER — Other Ambulatory Visit: Payer: Self-pay

## 2021-06-04 ENCOUNTER — Ambulatory Visit (INDEPENDENT_AMBULATORY_CARE_PROVIDER_SITE_OTHER): Payer: Federal, State, Local not specified - PPO

## 2021-06-04 VITALS — BP 119/77 | HR 80 | Temp 98.0°F | Resp 20

## 2021-06-04 DIAGNOSIS — G629 Polyneuropathy, unspecified: Secondary | ICD-10-CM

## 2021-06-04 MED ORDER — SODIUM CHLORIDE 0.9 % IV SOLN
1000.0000 mg | Freq: Once | INTRAVENOUS | Status: AC
  Administered 2021-06-04: 1000 mg via INTRAVENOUS
  Filled 2021-06-04: qty 8

## 2021-06-04 NOTE — Progress Notes (Signed)
Diagnosis: Inflammatory Neuropathy  Provider:  Marshell Garfinkel, MD  Procedure: Infusion  IV Type: Peripheral, IV Location: L Forearm  Solumedrol (Methylprednisolone), Dose: 1000 mg  Infusion Start Time: 3300  Infusion Stop Time: 7622  Post Infusion IV Care: Observation period completed and Peripheral IV Discontinued  Discharge: Condition: Good, Destination: Home . AVS provided to patient.   Performed by:  Koren Shiver, RN

## 2021-06-05 ENCOUNTER — Ambulatory Visit (INDEPENDENT_AMBULATORY_CARE_PROVIDER_SITE_OTHER): Payer: Federal, State, Local not specified - PPO

## 2021-06-05 VITALS — BP 128/68 | HR 83 | Temp 97.9°F | Resp 16

## 2021-06-05 DIAGNOSIS — G629 Polyneuropathy, unspecified: Secondary | ICD-10-CM | POA: Diagnosis not present

## 2021-06-05 MED ORDER — SODIUM CHLORIDE 0.9 % IV SOLN
1000.0000 mg | Freq: Once | INTRAVENOUS | Status: AC
  Administered 2021-06-05: 1000 mg via INTRAVENOUS
  Filled 2021-06-05: qty 8

## 2021-06-05 NOTE — Progress Notes (Signed)
Diagnosis: Inflammatory Neuropathy  Provider:  Marshell Garfinkel, MD  Procedure: Infusion  IV Type: Peripheral, IV Location: R Forearm  Solumedrol (Methylprednisolone), Dose: 1000 mg  Infusion Start Time: 1330  Infusion Stop Time: 1430  Post Infusion IV Care: Observation period completed and Peripheral IV Discontinued  Discharge: Condition: Good, Destination: Home . AVS provided to patient.   Performed by:  Koren Shiver, RN

## 2021-06-06 ENCOUNTER — Other Ambulatory Visit: Payer: Self-pay

## 2021-06-06 ENCOUNTER — Ambulatory Visit (INDEPENDENT_AMBULATORY_CARE_PROVIDER_SITE_OTHER): Payer: Federal, State, Local not specified - PPO

## 2021-06-06 VITALS — BP 124/76 | HR 77 | Temp 98.0°F | Resp 16

## 2021-06-06 DIAGNOSIS — G629 Polyneuropathy, unspecified: Secondary | ICD-10-CM | POA: Diagnosis not present

## 2021-06-06 MED ORDER — SODIUM CHLORIDE 0.9 % IV SOLN
1000.0000 mg | Freq: Once | INTRAVENOUS | Status: AC
  Administered 2021-06-06: 1000 mg via INTRAVENOUS
  Filled 2021-06-06: qty 8

## 2021-06-06 NOTE — Progress Notes (Signed)
Diagnosis: Inflammatory Neuropathy  Provider:  Marshell Garfinkel, MD  Procedure: Infusion  IV Type: Peripheral, IV Location: L Forearm  Solumedrol (Methylprednisolone), Dose: 1000 mg  Infusion Start Time: 4144  Infusion Stop Time: 1410  Post Infusion IV Care: Peripheral IV Discontinued  Discharge: Condition: Good, Destination: Home . AVS provided to patient.   Performed by:  Koren Shiver, RN

## 2021-06-07 ENCOUNTER — Ambulatory Visit (INDEPENDENT_AMBULATORY_CARE_PROVIDER_SITE_OTHER): Payer: Federal, State, Local not specified - PPO | Admitting: *Deleted

## 2021-06-07 VITALS — BP 162/90 | HR 80 | Temp 98.4°F | Resp 19

## 2021-06-07 DIAGNOSIS — G629 Polyneuropathy, unspecified: Secondary | ICD-10-CM | POA: Diagnosis not present

## 2021-06-07 MED ORDER — HEPARIN SOD (PORK) LOCK FLUSH 100 UNIT/ML IV SOLN: 250.0000 [IU] | Freq: Once | INTRAVENOUS | Status: DC | PRN

## 2021-06-07 MED ORDER — SODIUM CHLORIDE 0.9 % IV SOLN: Freq: Once | INTRAVENOUS | Status: DC | PRN

## 2021-06-07 MED ORDER — ALTEPLASE 2 MG IJ SOLR: 2.0000 mg | Freq: Once | INTRAMUSCULAR | Status: DC | PRN

## 2021-06-07 MED ORDER — SODIUM CHLORIDE 0.9% FLUSH: 10.0000 mL | Freq: Once | INTRAVENOUS | Status: DC | PRN

## 2021-06-07 MED ORDER — SODIUM CHLORIDE 0.9% FLUSH: 3.0000 mL | Freq: Once | INTRAVENOUS | Status: DC | PRN

## 2021-06-07 MED ORDER — ANTICOAGULANT SODIUM CITRATE 4% (200MG/5ML) IV SOLN: 5.0000 mL | Freq: Once | Status: DC | PRN

## 2021-06-07 MED ORDER — HEPARIN SOD (PORK) LOCK FLUSH 100 UNIT/ML IV SOLN: 500.0000 [IU] | Freq: Once | INTRAVENOUS | Status: DC | PRN

## 2021-06-07 MED ORDER — ALBUTEROL SULFATE HFA 108 (90 BASE) MCG/ACT IN AERS: 2.0000 | INHALATION_SPRAY | Freq: Once | RESPIRATORY_TRACT | Status: DC | PRN

## 2021-06-07 MED ORDER — METHYLPREDNISOLONE SODIUM SUCC 125 MG IJ SOLR: 125.0000 mg | Freq: Once | INTRAMUSCULAR | Status: DC | PRN

## 2021-06-07 MED ORDER — SODIUM CHLORIDE 0.9 % IV SOLN
1000.0000 mg | Freq: Once | INTRAVENOUS | Status: AC
  Administered 2021-06-07: 1000 mg via INTRAVENOUS
  Filled 2021-06-07: qty 8

## 2021-06-07 MED ORDER — EPINEPHRINE 0.3 MG/0.3ML IJ SOAJ: 0.3000 mg | Freq: Once | INTRAMUSCULAR | Status: DC | PRN

## 2021-06-07 MED ORDER — DIPHENHYDRAMINE HCL 50 MG/ML IJ SOLN: 50.0000 mg | Freq: Once | INTRAMUSCULAR | Status: DC | PRN

## 2021-06-07 MED ORDER — FAMOTIDINE IN NACL 20-0.9 MG/50ML-% IV SOLN: 20.0000 mg | Freq: Once | INTRAVENOUS | Status: DC | PRN

## 2021-06-07 NOTE — Progress Notes (Signed)
Diagnosis:Inflammatory neuropathy  Provider:  Marshell Garfinkel, MD  Procedure: Infusion  IV Type: Peripheral, IV Location: R Forearm  Solumedrol (Methylprednisolone), Dose: 1000 mg  Infusion Start Time:   8099 Infusion Stop Time: 1424  Post Infusion IV Care: Observation period completed  Discharge: Condition: Good, Destination: Home . AVS provided to patient.   Performed by:  Ludwig Lean, RN

## 2021-06-08 ENCOUNTER — Ambulatory Visit (INDEPENDENT_AMBULATORY_CARE_PROVIDER_SITE_OTHER): Payer: Federal, State, Local not specified - PPO

## 2021-06-08 ENCOUNTER — Other Ambulatory Visit: Payer: Self-pay

## 2021-06-08 VITALS — BP 156/87 | HR 87 | Temp 98.2°F | Wt 155.6 lb

## 2021-06-08 DIAGNOSIS — G629 Polyneuropathy, unspecified: Secondary | ICD-10-CM

## 2021-06-08 MED ORDER — METHYLPREDNISOLONE SODIUM SUCC 1000 MG IJ SOLR
1000.0000 mg | Freq: Once | INTRAMUSCULAR | Status: AC
  Administered 2021-06-08: 1000 mg via INTRAVENOUS
  Filled 2021-06-08: qty 8

## 2021-06-08 NOTE — Progress Notes (Addendum)
Diagnosis: Neuropathy  Provider:  Marshell Garfinkel, MD  Procedure: Infusion  IV Type: Peripheral, IV Location: L Forearm  Solumedrol (Methylprednisolone), Dose: 1000 mg  Infusion Start Time: 06/08/21 1328  Infusion Stop Time: 06/08/21 1435  Post Infusion IV Care: Observation period completed  Discharge: Condition: Good, Destination: Home . AVS provided to patient.   Performed by:  Jonelle Sidle, RN

## 2021-07-17 ENCOUNTER — Telehealth: Payer: Self-pay | Admitting: Orthopedic Surgery

## 2021-07-17 NOTE — Telephone Encounter (Signed)
Pt called requesting a call back. Pt sates he is having severe pains in right knee. Pt had surgery on his knee with Dr. Marlou Sa awhile back and it is giving him problems. He is asking to be seen sooner than 8/10. Asking for Lauren to open a slot or put him on cancellation list and possible pain medication. Please call pt about this matter at [phone].

## 2021-07-18 ENCOUNTER — Other Ambulatory Visit: Payer: Self-pay | Admitting: Neurology

## 2021-07-18 ENCOUNTER — Telehealth: Payer: Self-pay | Admitting: Orthopedic Surgery

## 2021-07-18 NOTE — Telephone Encounter (Signed)
Pt calling in extreme pain asking for a pain medication to help. Pt does have an appt tomorrow to see Dr. Marlou Sa but states he might not be able to make it because he is in so much pain. Pt has seen Marlou Sa a while back but has not been seen since. The best pharmacy is the one on file and the best call back number if needed is [phone].

## 2021-07-18 NOTE — Telephone Encounter (Signed)
IC sw patient. Advised unable to provide medication without OV since not seen since 2020

## 2021-07-19 ENCOUNTER — Other Ambulatory Visit: Payer: Self-pay

## 2021-07-19 ENCOUNTER — Ambulatory Visit: Payer: Self-pay

## 2021-07-19 ENCOUNTER — Ambulatory Visit (INDEPENDENT_AMBULATORY_CARE_PROVIDER_SITE_OTHER): Payer: Federal, State, Local not specified - PPO | Admitting: Orthopedic Surgery

## 2021-07-19 ENCOUNTER — Encounter: Payer: Self-pay | Admitting: Orthopedic Surgery

## 2021-07-19 DIAGNOSIS — G8929 Other chronic pain: Secondary | ICD-10-CM

## 2021-07-19 DIAGNOSIS — M25561 Pain in right knee: Secondary | ICD-10-CM | POA: Diagnosis not present

## 2021-07-19 DIAGNOSIS — M545 Low back pain, unspecified: Secondary | ICD-10-CM | POA: Diagnosis not present

## 2021-07-19 MED ORDER — ACETAMINOPHEN-CODEINE #3 300-30 MG PO TABS: 1.0000 | ORAL_TABLET | Freq: Three times a day (TID) | ORAL | 0 refills | Status: DC | PRN

## 2021-07-19 MED ORDER — METHOCARBAMOL 500 MG PO TABS: 500.0000 mg | ORAL_TABLET | Freq: Three times a day (TID) | ORAL | 0 refills | Status: DC | PRN

## 2021-07-19 MED ORDER — PREDNISONE 10 MG (21) PO TBPK: ORAL_TABLET | ORAL | 0 refills | Status: DC

## 2021-07-19 NOTE — Progress Notes (Signed)
Office Visit Note   Patient: Darrell Hoffman.           Date of Birth: [birth year]           MRN: [id number] Visit Date: 07/19/2021 Requested by: Biagio Borg, MD [place],   [id number] PCP: Biagio Borg, MD  Subjective: Chief Complaint  Patient presents with   Right Knee - Follow-up    HPI: Darrell Hoffman. is a 70 y.o. male who presents to the office complaining of right leg pain.  Patient notes over the last several weeks he has noticed increasing numbness and tingling in his right leg but over the last 3 days he has had severely worsening right leg pain.  Describes pain radiating from his buttocks down to the right knee and right shin.  He has a history of neuropathy for which she takes gabapentin 600 mg 3 times daily but typically has neuropathy only affects his right foot and ankle.  He now feels his whole leg is numb and has tingling.  He does have history of right total knee arthroplasty about 12 years ago.  Denies any injury to his knee or any fevers, change in appearance of the incision, drainage from the incision, swelling of the knee, pain with active range of motion of the knee.  Massaging helps.  He is able to walk but only short distances due to the radiating pain.  Denies any groin pain..                ROS: All systems reviewed are negative as they relate to the chief complaint within the history of present illness.  Patient denies fevers or chills.  Assessment & Plan: Visit Diagnoses:  1. Chronic pain of right knee   2. Low back pain, unspecified back pain laterality, unspecified chronicity, unspecified whether sciatica present     Plan: Patient is a 70 year old male who presents complaint of right lower extremity pain.  He has radicular pain that is worsening that radiates down the entire right lower extremity.  He is concerned that pain is coming from the knee but radiographs of the knee look good today with no evidence of loosening.  He has no  evidence of infection on exam with excellent passive motion of the right knee without pain and no knee effusion or knee warmth.  He does have nerve root tension signs on exam with severe pain with straight leg raise of the right lower extremity as well as some weakness of hip flexion on exam.  Plan to order MRI of the lumbar spine for further evaluation given the degenerative changes noted on his radiographs today.  Also plan to prescribe Medrol Dosepak, Robaxin, Tylenol 3 for symptomatic relief in the meantime.  Follow-up after scan to review results.  Follow-Up Instructions: No follow-ups on file.   Orders:  Orders Placed This Encounter  Procedures   XR KNEE 3 VIEW RIGHT   XR Lumbar Spine 2-3 Views   MR Lumbar Spine w/o contrast   Meds ordered this encounter  Medications   predniSONE (STERAPRED UNI-PAK 21 TAB) 10 MG (21) TBPK tablet    Sig: Take as directed on packaging    Dispense:  21 tablet    Refill:  0      Procedures: No procedures performed   Clinical Data: No additional findings.  Objective: Vital Signs: There were no vitals taken for this visit.  Physical Exam:  Constitutional: Patient appears well-developed  HEENT:  Head: Normocephalic Eyes:EOM are normal Neck: Normal range of motion Cardiovascular: Normal rate Pulmonary/chest: Effort normal Neurologic: Patient is alert Skin: Skin is warm Psychiatric: Patient has normal mood and affect  Ortho Exam: Ortho exam demonstrates starkly positive straight leg raise of the right lower extremity with severe radicular pain down the leg especially around the anterior thigh and right knee.  No pain with hip range of motion.  Increased pain with patient sitting up and he feels is easier to lean to his left.  +5 motor strength of bilateral quad, hamstring, dorsiflexion, plantarflexion.  4/5 motor strength of hip flexor on right, 5/5 on left.  No clonus.  Right knee without effusion.  No warmth.  Incision is well-healed with no  evidence of dehiscence.  Excellent range of motion with 0 degrees of extension and about 125 degrees of knee flexion.  No calf tenderness.  No pain with passive motion of the knee.  Specialty Comments:  No specialty comments available.  Imaging: No results found.   PMFS History: Patient Active Problem List   Diagnosis Date Noted   Neuropathy 05/29/2021   Malnutrition of moderate degree 01/05/2021   Gastric outlet obstruction 01/01/2021   Hiatal hernia with obstruction but no gangrene 01/01/2021   Preoperative cardiovascular examination 11/21/2020   Bilateral leg pain 08/23/2020   Dysphagia 08/09/2020   Tetrahydrocannabinol (THC) use disorder, moderate, dependence ([place]) 08/09/2020   Weight loss 06/07/2020   Vitamin D deficiency 06/07/2020   Cough 02/10/2019   Subacromial bursitis of left shoulder joint 01/19/2019   CKD (chronic kidney disease) stage 3, GFR 30-59 ml/min (HCC) 03/31/2018   Hyperglycemia 09/25/2017   Daytime somnolence 09/25/2017   Finger pain, right 03/27/2017   History of colonic polyps 03/27/2016   Pre-ulcerative corn or callous 03/27/2016   Skin nodule 01/10/2016   Venereal disease, unspecified 03/23/2012   Renal insufficiency 04/02/2011   Right elbow pain 04/02/2011   Encounter for well adult exam with abnormal findings 03/30/2011   ANEMIA-B12 DEFICIENCY 12/04/2009   ANEMIA-IRON DEFICIENCY 11/27/2009   B12 deficiency 10/06/2008   KNEE PAIN, RIGHT 10/08/2007   Hyperlipidemia 10/03/2007   Morbid obesity ([place]) 10/03/2007   ERECTILE DYSFUNCTION 10/03/2007   Essential hypertension 10/03/2007   ALLERGIC RHINITIS 10/03/2007   Asthma 10/03/2007   GERD 10/03/2007   ALCOHOL ABUSE, HX OF 10/03/2007   COLONIC POLYPS, HX OF 10/03/2007   Past Medical History:  Diagnosis Date   ALCOHOL ABUSE, HX OF 10/03/2007   ALLERGIC RHINITIS 10/03/2007   ANEMIA-B12 DEFICIENCY 12/04/2009   ANEMIA-IRON DEFICIENCY 11/27/2009   ANEMIA-NOS 10/08/2007   ASTHMA 10/03/2007    Asthma    B12 DEFICIENCY 10/06/2008   CKD (chronic kidney disease) stage 3, GFR 30-59 ml/min ([place]) 03/31/2018   COLONIC POLYPS, HX OF 10/03/2007   ERECTILE DYSFUNCTION 10/03/2007   GERD 10/03/2007   History of hiatal hernia    found on CXT in June 2021   HYPERLIPIDEMIA 10/03/2007   HYPERTENSION 10/03/2007   KNEE PAIN, RIGHT 10/08/2007   Morbid obesity ([place]) 10/03/2007   Neuromuscular disorder ([place])    per pt. never diagnosed   Substance abuse ([place])    marijuana daily    Family History  Problem Relation Age of Onset   Breast cancer Mother    Lupus Sister    Hypertension Sister    Diabetes Sister    Alcohol abuse Paternal Uncle    Coronary artery disease Other        male 1st degree relative - MI  at 69 yo   Diabetes Cousin        2 cousins   Colon cancer Neg Hx    Esophageal cancer Neg Hx    Rectal cancer Neg Hx    Stomach cancer Neg Hx    Colon polyps Neg Hx     Past Surgical History:  Procedure Laterality Date   bilateral hydrocele repair     COLONOSCOPY  06/06/2016   POLYPECTOMY  01-03-2010   s/p left knee arthroscopy  2009   s/p TKR  03/2009   VASECTOMY     XI ROBOTIC ASSISTED HIATAL HERNIA REPAIR N/A 01/04/2021   Procedure: XI ROBOTIC ASSISTED HIATAL HERNIA REPAIR AND FUNDOPLICATION WITH MESH;  Surgeon: Ralene Ok, MD;  Location: [facility];  Service: General;  Laterality: N/A;   Social History   Occupational History   Occupation: Research officer, political party: [employer]  Tobacco Use   Smoking status: Some Days   Smokeless tobacco: Never  Vaping Use   Vaping Use: Never used  Substance and Sexual Activity   Alcohol use: Not Currently    Alcohol/week: 0.0 standard drinks   Drug use: Yes    Types: Marijuana    Comment: marijuana daily   Sexual activity: Not on file

## 2021-07-25 ENCOUNTER — Ambulatory Visit (INDEPENDENT_AMBULATORY_CARE_PROVIDER_SITE_OTHER): Payer: Federal, State, Local not specified - PPO | Admitting: Orthopedic Surgery

## 2021-07-25 ENCOUNTER — Other Ambulatory Visit: Payer: Self-pay

## 2021-07-25 DIAGNOSIS — M25561 Pain in right knee: Secondary | ICD-10-CM | POA: Diagnosis not present

## 2021-07-25 DIAGNOSIS — M545 Low back pain, unspecified: Secondary | ICD-10-CM | POA: Diagnosis not present

## 2021-07-25 DIAGNOSIS — G8929 Other chronic pain: Secondary | ICD-10-CM

## 2021-07-27 ENCOUNTER — Telehealth: Payer: Self-pay | Admitting: Orthopedic Surgery

## 2021-07-27 NOTE — Telephone Encounter (Signed)
Pt asking for a refill on his presciptions of Robaxin and Tylenol #3. The best pharmacy is the one on file and the best call back number if needed is [phone].

## 2021-07-27 NOTE — Telephone Encounter (Signed)
This will be addressed at [place] today.

## 2021-07-29 ENCOUNTER — Encounter: Payer: Self-pay | Admitting: Orthopedic Surgery

## 2021-07-29 MED ORDER — METHOCARBAMOL 500 MG PO TABS: 500.0000 mg | ORAL_TABLET | Freq: Three times a day (TID) | ORAL | 0 refills | Status: DC | PRN

## 2021-07-29 MED ORDER — ACETAMINOPHEN-CODEINE #3 300-30 MG PO TABS: 1.0000 | ORAL_TABLET | ORAL | 0 refills | Status: DC | PRN

## 2021-07-29 NOTE — Progress Notes (Signed)
Office Visit Note   Patient: Darrell Hoffman.           Date of Birth: 04-Apr-1951           MRN: [id number] Visit Date: 07/25/2021 Requested by: Biagio Borg, MD [place],  [place] [id number] PCP: Biagio Borg, MD  Subjective: Chief Complaint  Patient presents with   Right Leg - Follow-up   Right Knee - Follow-up    HPI: Darrell Hoffman is a 70 year old patient with right knee and leg pain.  MRI not done yet.  No one has contacted him.  The order has been placed.  We looked at that extensively today.  Steroids helped with his walking endurance.  He has been taking Robaxin and Tylenol 3 as well which gives him minimal relief.  Neurontin helps him more.  He is having some right foot tingling.  This is position related.  He is seeing a neurologist on August 31.  He does have relief from his symptoms when he is laying down.              ROS: All systems reviewed are negative as they relate to the chief complaint within the history of present illness.  Patient denies  fevers or chills.   Assessment & Plan: Visit Diagnoses:  1. Chronic pain of right knee   2. Low back pain, unspecified back pain laterality, unspecified chronicity, unspecified whether sciatica present     Plan: Impression is right-sided radiculopathy.  I Minna refill Tylenol 3 and Robaxin.  MRI scan pending.  Neurology appointment also pending.  He has had significant amount of weight loss as well so I think the MRI scan on his back is fairly imperative.  Follow-up after that scan.  Follow-Up Instructions: Return for after MRI.   Orders:  No orders of the defined types were placed in this encounter.  No orders of the defined types were placed in this encounter.     Procedures: No procedures performed   Clinical Data: No additional findings.  Objective: Vital Signs: There were no vitals taken for this visit.  Physical Exam:   Constitutional: Patient appears well-developed HEENT:  Head:  Normocephalic Eyes:EOM are normal Neck: Normal range of motion Cardiovascular: Normal rate Pulmonary/chest: Effort normal Neurologic: Patient is alert Skin: Skin is warm Psychiatric: Patient has normal mood and affect   Ortho Exam: Ortho exam demonstrates full active and passive range of motion of the hips and knees.  Has good ankle dorsiflexion plantarflexion quad hamstring strength with no asymmetric atrophy.  Pedal pulses palpable.  No trochanteric tenderness is noted.  No discrete nerve root tension signs.  Does describe paresthesias in the L4-5 distribution.  Specialty Comments:  No specialty comments available.  Imaging: No results found.   PMFS History: Patient Active Problem List   Diagnosis Date Noted   Neuropathy 05/29/2021   Malnutrition of moderate degree 01/05/2021   Gastric outlet obstruction 01/01/2021   Hiatal hernia with obstruction but no gangrene 01/01/2021   Preoperative cardiovascular examination 11/21/2020   Bilateral leg pain 08/23/2020   Dysphagia 08/09/2020   Tetrahydrocannabinol (THC) use disorder, moderate, dependence ([place]) 08/09/2020   Weight loss 06/07/2020   Vitamin D deficiency 06/07/2020   Cough 02/10/2019   Subacromial bursitis of left shoulder joint 01/19/2019   CKD (chronic kidney disease) stage 3, GFR 30-59 ml/min (HCC) 03/31/2018   Hyperglycemia 09/25/2017   Daytime somnolence 09/25/2017   Finger pain, right 03/27/2017   History of  colonic polyps 03/27/2016   Pre-ulcerative corn or callous 03/27/2016   Skin nodule 01/10/2016   Venereal disease, unspecified 03/23/2012   Renal insufficiency 04/02/2011   Right elbow pain 04/02/2011   Encounter for well adult exam with abnormal findings 03/30/2011   ANEMIA-B12 DEFICIENCY 12/04/2009   ANEMIA-IRON DEFICIENCY 11/27/2009   B12 deficiency 10/06/2008   KNEE PAIN, RIGHT 10/08/2007   Hyperlipidemia 10/03/2007   Morbid obesity ([place]) 10/03/2007   ERECTILE DYSFUNCTION 10/03/2007   Essential  hypertension 10/03/2007   ALLERGIC RHINITIS 10/03/2007   Asthma 10/03/2007   GERD 10/03/2007   ALCOHOL ABUSE, HX OF 10/03/2007   COLONIC POLYPS, HX OF 10/03/2007   Past Medical History:  Diagnosis Date   ALCOHOL ABUSE, HX OF 10/03/2007   ALLERGIC RHINITIS 10/03/2007   ANEMIA-B12 DEFICIENCY 12/04/2009   ANEMIA-IRON DEFICIENCY 11/27/2009   ANEMIA-NOS 10/08/2007   ASTHMA 10/03/2007   Asthma    B12 DEFICIENCY 10/06/2008   CKD (chronic kidney disease) stage 3, GFR 30-59 ml/min (HCC) 03/31/2018   COLONIC POLYPS, HX OF 10/03/2007   ERECTILE DYSFUNCTION 10/03/2007   GERD 10/03/2007   History of hiatal hernia    found on CXT in June 2021   HYPERLIPIDEMIA 10/03/2007   HYPERTENSION 10/03/2007   KNEE PAIN, RIGHT 10/08/2007   Morbid obesity ([place]) 10/03/2007   Neuromuscular disorder ([place])    per pt. never diagnosed   Substance abuse ([place])    marijuana daily    Family History  Problem Relation Age of Onset   Breast cancer Mother    Lupus Sister    Hypertension Sister    Diabetes Sister    Alcohol abuse Paternal Uncle    Coronary artery disease Other        male 1st degree relative - MI at 69 yo   Diabetes Cousin        2 cousins   Colon cancer Neg Hx    Esophageal cancer Neg Hx    Rectal cancer Neg Hx    Stomach cancer Neg Hx    Colon polyps Neg Hx     Past Surgical History:  Procedure Laterality Date   bilateral hydrocele repair     COLONOSCOPY  06/06/2016   POLYPECTOMY  01-03-2010   s/p left knee arthroscopy  2009   s/p TKR  03/2009   VASECTOMY     XI ROBOTIC ASSISTED HIATAL HERNIA REPAIR N/A 01/04/2021   Procedure: XI ROBOTIC ASSISTED HIATAL HERNIA REPAIR AND FUNDOPLICATION WITH MESH;  Surgeon: Ralene Ok, MD;  Location: [facility];  Service: General;  Laterality: N/A;   Social History   Occupational History   Occupation: Research officer, political party: [employer]  Tobacco Use   Smoking status: Some Days   Smokeless tobacco: Never  Vaping Use   Vaping  Use: Never used  Substance and Sexual Activity   Alcohol use: Not Currently    Alcohol/week: 0.0 standard drinks   Drug use: Yes    Types: Marijuana    Comment: marijuana daily   Sexual activity: Not on file

## 2021-08-02 ENCOUNTER — Ambulatory Visit
Admission: RE | Admit: 2021-08-02 | Discharge: 2021-08-02 | Disposition: A | Discharge status: Home / Self Care | Payer: Federal, State, Local not specified - PPO | Source: Ambulatory Visit | Attending: Surgical | Admitting: Surgical

## 2021-08-02 DIAGNOSIS — M47816 Spondylosis without myelopathy or radiculopathy, lumbar region: Secondary | ICD-10-CM | POA: Diagnosis not present

## 2021-08-02 DIAGNOSIS — M48061 Spinal stenosis, lumbar region without neurogenic claudication: Secondary | ICD-10-CM | POA: Diagnosis not present

## 2021-08-02 DIAGNOSIS — M5127 Other intervertebral disc displacement, lumbosacral region: Secondary | ICD-10-CM | POA: Diagnosis not present

## 2021-08-02 DIAGNOSIS — M545 Low back pain, unspecified: Secondary | ICD-10-CM

## 2021-08-02 DIAGNOSIS — M5126 Other intervertebral disc displacement, lumbar region: Secondary | ICD-10-CM | POA: Diagnosis not present

## 2021-08-10 ENCOUNTER — Ambulatory Visit (INDEPENDENT_AMBULATORY_CARE_PROVIDER_SITE_OTHER): Payer: Federal, State, Local not specified - PPO | Admitting: Orthopedic Surgery

## 2021-08-10 ENCOUNTER — Other Ambulatory Visit: Payer: Self-pay

## 2021-08-10 ENCOUNTER — Encounter: Payer: Self-pay | Admitting: Orthopedic Surgery

## 2021-08-10 DIAGNOSIS — M545 Low back pain, unspecified: Secondary | ICD-10-CM

## 2021-08-10 NOTE — Progress Notes (Signed)
Office Visit Note   Patient: Darrell Hoffman.           Date of Birth: August 12, 1951           MRN: [id number] Visit Date: 08/10/2021 Requested by: Biagio Borg, MD [name],  [place]rell [place] [id number] PCP: Biagio Borg, MD  Subjective: Chief Complaint  Patient presents with   Other     MRI scan review    HPI: [name] is a 70 year old patient with low back pain.  Since he was last seen he has had an MRI scan.  He has primarily right-sided symptoms.  MRI scan is reviewed and it does show severe left-sided L4-5 foraminal narrowing as well as moderate to severe right-sided L4-5 foraminal narrowing.  No hip arthritis present on plain radiographs.  He does have neuropathy as a clinical complaint.  He is concerned about being out of work but could potentially be out of work for 4 more weeks.  He is not really able to get back to work now with the status of his back pain.              ROS: All systems reviewed are negative as they relate to the chief complaint within the history of present illness.  Patient denies  fevers or chills.   Assessment & Plan: Visit Diagnoses:  1. Low back pain, unspecified back pain laterality, unspecified chronicity, unspecified whether sciatica present     Plan: Impression is low back pain with symptomatic right-sided foraminal narrowing.  Plan is out of work for 4 weeks with referral to Dr. Ernestina Patches for ESI to help with right-sided radicular symptoms.  Follow-Up Instructions: No follow-ups on file.   Orders:  Orders Placed This Encounter  Procedures   Ambulatory referral to Physical Medicine Rehab   No orders of the defined types were placed in this encounter.     Procedures: No procedures performed   Clinical Data: No additional findings.  Objective: Vital Signs: There were no vitals taken for this visit.  Physical Exam:   Constitutional: Patient appears well-developed HEENT:  Head: Normocephalic Eyes:EOM are normal Neck: Normal range  of motion Cardiovascular: Normal rate Pulmonary/chest: Effort normal Neurologic: Patient is alert Skin: Skin is warm Psychiatric: Patient has normal mood and affect   Ortho Exam: Ortho exam demonstrates full active and passive range of motion of the knees and hips.  No nerve root tension signs.  Patient has symmetric muscle tone bilaterally.  Is most comfortable laying down.  Specialty Comments:  No specialty comments available.  Imaging: No results found.   PMFS History: Patient Active Problem List   Diagnosis Date Noted   Neuropathy 05/29/2021   Malnutrition of moderate degree 01/05/2021   Gastric outlet obstruction 01/01/2021   Hiatal hernia with obstruction but no gangrene 01/01/2021   Preoperative cardiovascular examination 11/21/2020   Bilateral leg pain 08/23/2020   Dysphagia 08/09/2020   Tetrahydrocannabinol (THC) use disorder, moderate, dependence ([place]) 08/09/2020   Weight loss 06/07/2020   Vitamin D deficiency 06/07/2020   Cough 02/10/2019   Subacromial bursitis of left shoulder joint 01/19/2019   CKD (chronic kidney disease) stage 3, GFR 30-59 ml/min (HCC) 03/31/2018   Hyperglycemia 09/25/2017   Daytime somnolence 09/25/2017   Finger pain, right 03/27/2017   History of colonic polyps 03/27/2016   Pre-ulcerative corn or callous 03/27/2016   Skin nodule 01/10/2016   Venereal disease, unspecified 03/23/2012   Renal insufficiency 04/02/2011   Right elbow pain 04/02/2011  Encounter for well adult exam with abnormal findings 03/30/2011   ANEMIA-B12 DEFICIENCY 12/04/2009   ANEMIA-IRON DEFICIENCY 11/27/2009   B12 deficiency 10/06/2008   KNEE PAIN, RIGHT 10/08/2007   Hyperlipidemia 10/03/2007   Morbid obesity ([place]) 10/03/2007   ERECTILE DYSFUNCTION 10/03/2007   Essential hypertension 10/03/2007   ALLERGIC RHINITIS 10/03/2007   Asthma 10/03/2007   GERD 10/03/2007   ALCOHOL ABUSE, HX OF 10/03/2007   COLONIC POLYPS, HX OF 10/03/2007   Past Medical History:   Diagnosis Date   ALCOHOL ABUSE, HX OF 10/03/2007   ALLERGIC RHINITIS 10/03/2007   ANEMIA-B12 DEFICIENCY 12/04/2009   ANEMIA-IRON DEFICIENCY 11/27/2009   ANEMIA-NOS 10/08/2007   ASTHMA 10/03/2007   Asthma    B12 DEFICIENCY 10/06/2008   CKD (chronic kidney disease) stage 3, GFR 30-59 ml/min (HCC) 03/31/2018   COLONIC POLYPS, HX OF 10/03/2007   ERECTILE DYSFUNCTION 10/03/2007   GERD 10/03/2007   History of hiatal hernia    found on CXT in June 2021   HYPERLIPIDEMIA 10/03/2007   HYPERTENSION 10/03/2007   KNEE PAIN, RIGHT 10/08/2007   Morbid obesity ([place]) 10/03/2007   Neuromuscular disorder ([place])    per pt. never diagnosed   Substance abuse ([place])    marijuana daily    Family History  Problem Relation Age of Onset   Breast cancer Mother    Lupus Sister    Hypertension Sister    Diabetes Sister    Alcohol abuse Paternal Uncle    Coronary artery disease Other        male 1st degree relative - MI at 69 yo   Diabetes Cousin        2 cousins   Colon cancer Neg Hx    Esophageal cancer Neg Hx    Rectal cancer Neg Hx    Stomach cancer Neg Hx    Colon polyps Neg Hx     Past Surgical History:  Procedure Laterality Date   bilateral hydrocele repair     COLONOSCOPY  06/06/2016   POLYPECTOMY  01-03-2010   s/p left knee arthroscopy  2009   s/p TKR  03/2009   VASECTOMY     XI ROBOTIC ASSISTED HIATAL HERNIA REPAIR N/A 01/04/2021   Procedure: XI ROBOTIC ASSISTED HIATAL HERNIA REPAIR AND FUNDOPLICATION WITH MESH;  Surgeon: Ralene Ok, MD;  Location: [facility];  Service: General;  Laterality: N/A;   Social History   Occupational History   Occupation: Research officer, political party: [employer]  Tobacco Use   Smoking status: Some Days   Smokeless tobacco: Never  Vaping Use   Vaping Use: Never used  Substance and Sexual Activity   Alcohol use: Not Currently    Alcohol/week: 0.0 standard drinks   Drug use: Yes    Types: Marijuana    Comment: marijuana daily   Sexual  activity: Not on file

## 2021-08-13 NOTE — Progress Notes (Signed)
Diagnosis:Inflammatory neuropathy  Provider:  Marshell Garfinkel, MD  Procedure: Infusion  IV Type: Peripheral, IV Location: R Forearm  Solumedrol (Methylprednisolone), Dose: 1000 mg  Infusion Start Time:   [id number] Infusion Stop Time: 1424  Post Infusion IV Care: Observation period completed  Discharge: Condition: Good, Destination: Home . AVS provided to patient.   Performed by:  Sandie Ano RN

## 2021-08-14 ENCOUNTER — Telehealth: Payer: Self-pay | Admitting: Physical Medicine and Rehabilitation

## 2021-08-14 NOTE — Telephone Encounter (Signed)
Patient called. He missed a call to schedule with Dr. Ernestina Patches. His call back number is [phone]

## 2021-08-14 NOTE — Progress Notes (Signed)
NEUROLOGY FOLLOW UP OFFICE NOTE  Darrell Hoffman [id number]  Assessment/Plan:   Polyneuropathy with polyradiculopathy - given the active on chronic changes and sural sparing on NCV-EMG, CIDP was suspected.  However, CSF analysis was normal.  He started having lumbosacral radicular pain and MRI lumbar spine does show neural foraminal narrowing which can account for the radicular findings on EMG.  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  Refilled gabapentin '600mg'$  four times daily Awaiting ESI Follow up 3 months  Subjective:  Darrell Hoffman is a 70 year old right-handed male with HTN, CKD, anemia, and history of alcohol abuse who follows up for neuropathy.  UPDATE: Underwent workup for neuropathy. Labs from 02/06/2021 demonstrated negative ANA, B12 313, ACE 18, B1 22, SPEP/IFE negative for M-spike.  NCV-EMG on 03/13/2021 showed active on chronic polyradiculopathy with predominantly axonal features and sural sparing of the lower extremities.  To evaluate for CIDP, underwent lumbar on 04/05/2021 which showed normal protein of 39, glucose 49, cell count 1, negative cytology, ACE 5, negative Lyme and negative culture.  Underwent 5 days of IV Solu-Medrol in June No improvement.  Gabapentin '600mg'$  three times daily which helped.  3 weeks ago he developed right leg pain.  Knee etiology was ruled out.  Followed up with orthopedics.  MRI of lumbar spine on 08/02/2021 showed multilevel degenerative changes with moderate right neural foraminal stenosis at L2-3, moderatge to severe left and moderate right neural foraminal stenosis at L4-5 and moderate bilateral neural foraminal stenosis at L5-S1.  Plan is to undergo ESI for right-sided radicular symptoms.  He is understandably frustrated about the pain.   HISTORY: He has history of lower extremity pain since late 2021 to early 2022. Workup for PAD was negative.  History of B12 deficiency on supplements with level  from December 561.  Hgb A1c was 5.7 and TSH 2.11.  In August 2021, underwent labs for neuromuscular disorder - AChR antibody negative, CK was 251, aldolase 2.4, and sed rate 22.  Pain significantly worsened after hiatal hernia repair in January.  Initially feet feel cold.  Pain in arches and in between sharp pain sharp in legs - wakes up in night - can't sleep - no numbness and tingling - OTC ointment helps short term and massage - not notice while on feet or walking.  Weight loss over last year hiatal hernia weakness - no back sometimes pain tingling in hands.    PAST MEDICAL HISTORY: Past Medical History:  Diagnosis Date   ALCOHOL ABUSE, HX OF 10/03/2007   ALLERGIC RHINITIS 10/03/2007   ANEMIA-B12 DEFICIENCY 12/04/2009   ANEMIA-IRON DEFICIENCY 11/27/2009   ANEMIA-NOS 10/08/2007   ASTHMA 10/03/2007   Asthma    B12 DEFICIENCY 10/06/2008   CKD (chronic kidney disease) stage 3, GFR 30-59 ml/min (HCC) 03/31/2018   COLONIC POLYPS, HX OF 10/03/2007   ERECTILE DYSFUNCTION 10/03/2007   GERD 10/03/2007   History of hiatal hernia    found on CXT in June 2021   HYPERLIPIDEMIA 10/03/2007   HYPERTENSION 10/03/2007   KNEE PAIN, RIGHT 10/08/2007   Morbid obesity ([place]) 10/03/2007   Neuromuscular disorder ([place])    per pt. never diagnosed   Substance abuse ([place])    marijuana daily    MEDICATIONS: Current Outpatient Medications on File Prior to Visit  Medication Sig Dispense Refill   acetaminophen-codeine (TYLENOL #3) 300-30 MG tablet Take 1 tablet by mouth every 8 (eight) hours as needed for moderate pain. 30 tablet 0  acetaminophen-codeine (TYLENOL #3) 300-30 MG tablet Take 1 tablet by mouth every 4 (four) hours as needed for moderate pain. 30 tablet 0   albuterol (PROVENTIL HFA) 108 (90 Base) MCG/ACT inhaler Inhale 2 puffs into the lungs every 4 (four) hours as needed. (Patient taking differently: Inhale 2 puffs into the lungs every 4 (four) hours as needed for wheezing or shortness of breath.) 18  g 2   ASPIRIN LOW DOSE 81 MG EC tablet TAKE 1 TABLET(81 MG) BY MOUTH DAILY. SWALLOW WHOLE 90 tablet 2   CIALIS 20 MG tablet TAKE 1 TABLET BY MOUTH DAILY AS NEEDED 5 tablet 11   fluticasone (FLONASE) 50 MCG/ACT nasal spray Place 2 sprays into both nostrils daily. 16 g 5   gabapentin (NEURONTIN) 300 MG capsule Take 300 mg by mouth daily.     gabapentin (NEURONTIN) 600 MG tablet Take 1 tablet (600 mg total) by mouth 3 (three) times daily. 60 tablet 2   glycerin adult 2 g suppository Place 1 suppository rectally daily as needed for constipation.     methocarbamol (ROBAXIN) 500 MG tablet Take 1 tablet (500 mg total) by mouth every 8 (eight) hours as needed. 30 tablet 0   methocarbamol (ROBAXIN) 500 MG tablet Take 1 tablet (500 mg total) by mouth every 8 (eight) hours as needed for muscle spasms. 30 tablet 0   metoCLOPramide (REGLAN) 5 MG tablet Take 1 tablet (5 mg total) by mouth 4 (four) times daily -  before meals and at bedtime. (Patient not taking: Reported on 02/06/2021) 120 tablet 1   omeprazole (PRILOSEC) 40 MG capsule TAKE 1 CAPSULE(40 MG) BY MOUTH IN THE MORNING AND AT BEDTIME 60 capsule 2   predniSONE (STERAPRED UNI-PAK 21 TAB) 10 MG (21) TBPK tablet Take as directed on packaging 21 tablet 0   vitamin B-12 (CYANOCOBALAMIN) 1000 MCG tablet Take 1 tablet (1,000 mcg total) by mouth daily. 90 tablet 3   WIXELA INHUB 500-50 MCG/DOSE AEPB INHALE 1 PUFF INTO THE LUNGS TWICE DAILY (Patient taking differently: Inhale 1 puff into the lungs 2 (two) times daily.) 60 each 11   No current facility-administered medications on file prior to visit.    ALLERGIES: No Known Allergies  FAMILY HISTORY: Family History  Problem Relation Age of Onset   Breast cancer Mother    Lupus Sister    Hypertension Sister    Diabetes Sister    Alcohol abuse Paternal Uncle    Coronary artery disease Other        male 1st degree relative - MI at 69 yo   Diabetes Cousin        2 cousins   Colon cancer Neg Hx     Esophageal cancer Neg Hx    Rectal cancer Neg Hx    Stomach cancer Neg Hx    Colon polyps Neg Hx       Objective:  Blood pressure 120/80, pulse 96, height '5\' 5"'$  (1.651 m), weight 150 lb (68 kg), SpO2 98 %. General: No acute distress.  Patient appears well-groomed.   Head:  Normocephalic/atraumatic Eyes:  Fundi examined but not visualized Neck: supple, no paraspinal tenderness, full range of motion Heart:  Regular rate and rhythm Lungs:  Clear to auscultation bilaterally Back: No paraspinal tenderness Neurological Exam: alert and oriented to person, place, and time.  Speech fluent and not dysarthric, language intact.  CN II-XII intact. Bulk and tone normal, muscle strength 5/5 throughout.  Sensation to pinprick reduced to just above knee on right and  just below knee on left.  Vibratory sensation mildly reduced in toes.  Deep tendon reflexes 2+ throughout except absent in ankles, toes downgoing.  Finger to nose testing intact.  Broad-based gait.  Romberg with sway   Metta Clines, DO  CC: Cathlean Cower, MD

## 2021-08-15 ENCOUNTER — Encounter: Payer: Self-pay | Admitting: Neurology

## 2021-08-15 ENCOUNTER — Ambulatory Visit: Payer: Federal, State, Local not specified - PPO | Admitting: Neurology

## 2021-08-15 ENCOUNTER — Other Ambulatory Visit: Payer: Self-pay

## 2021-08-15 VITALS — BP 120/80 | HR 96 | Ht 65.0 in | Wt 150.0 lb

## 2021-08-15 DIAGNOSIS — M5416 Radiculopathy, lumbar region: Secondary | ICD-10-CM | POA: Diagnosis not present

## 2021-08-15 DIAGNOSIS — G609 Hereditary and idiopathic neuropathy, unspecified: Secondary | ICD-10-CM

## 2021-08-15 MED ORDER — GABAPENTIN 600 MG PO TABS: 600.0000 mg | ORAL_TABLET | Freq: Four times a day (QID) | ORAL | 5 refills | Status: DC

## 2021-08-15 NOTE — Patient Instructions (Addendum)
Refilled gabapentin '600mg'$  four times daily We will see how you respond to the injections.  Let me know.

## 2021-08-16 ENCOUNTER — Encounter: Payer: Self-pay | Admitting: Neurology

## 2021-08-29 ENCOUNTER — Ambulatory Visit (INDEPENDENT_AMBULATORY_CARE_PROVIDER_SITE_OTHER): Payer: Federal, State, Local not specified - PPO | Admitting: Physical Medicine and Rehabilitation

## 2021-08-29 ENCOUNTER — Encounter: Payer: Self-pay | Admitting: Physical Medicine and Rehabilitation

## 2021-08-29 ENCOUNTER — Ambulatory Visit: Payer: Self-pay

## 2021-08-29 ENCOUNTER — Other Ambulatory Visit: Payer: Self-pay

## 2021-08-29 VITALS — BP 118/83 | HR 80

## 2021-08-29 DIAGNOSIS — M5416 Radiculopathy, lumbar region: Secondary | ICD-10-CM | POA: Diagnosis not present

## 2021-08-29 MED ORDER — METHYLPREDNISOLONE ACETATE 80 MG/ML IJ SUSP
80.0000 mg | Freq: Once | INTRAMUSCULAR | Status: AC
Start: 2021-08-29 — End: 2021-08-29
  Administered 2021-08-29: 80 mg

## 2021-08-29 NOTE — Patient Instructions (Signed)

## 2021-08-29 NOTE — Progress Notes (Signed)
Pt state lower back pain the travels to his right hip and right leg.. Pt state walking, standing and sitting makes the pain worse. Pt state he takes pain meds to help ease his pain.  Numeric Pain Rating Scale and Functional Assessment Average Pain 3   In the last MONTH (on 0-10 scale) has pain interfered with the following?  1. General activity like being  able to carry out your everyday physical activities such as walking, climbing stairs, carrying groceries, or moving a chair?  Rating(8)   +Driver, -BT, -Dye Allergies.

## 2021-09-02 NOTE — Progress Notes (Signed)
[name]. - 70 y.o. male MRN [id number]  Date of birth: [birth year]  Office Visit Note: Visit Date: 08/29/2021 PCP: Biagio Borg, MD Referred by: Biagio Borg, MD  Subjective: Chief Complaint  Patient presents with   Lower Back - Pain   Right Hip - Pain   Right Leg - Pain   HPI:  Braysen Hyden. is a 70 y.o. male who comes in today at the request of Dr. Anderson Malta for planned Right L4-5 Lumbar Transforaminal epidural steroid injection with fluoroscopic guidance.  The patient has failed conservative care including home exercise, medications, time and activity modification.  This injection will be diagnostic and hopefully therapeutic.  Please see requesting physician notes for further details and justification. MRI reviewed with images and spine model.  MRI reviewed in the note below.     ROS Otherwise per HPI.  Assessment & Plan: Visit Diagnoses:    ICD-10-CM   1. Lumbar radiculopathy  M54.16 XR C-ARM NO REPORT    Epidural Steroid injection    methylPREDNISolone acetate (DEPO-MEDROL) injection 80 mg      Plan: No additional findings.   Meds & Orders:  Meds ordered this encounter  Medications   methylPREDNISolone acetate (DEPO-MEDROL) injection 80 mg    Orders Placed This Encounter  Procedures   XR C-ARM NO REPORT   Epidural Steroid injection    Follow-up: Return if symptoms worsen or fail to improve.   Procedures: No procedures performed  Lumbosacral Transforaminal Epidural Steroid Injection - Sub-Pedicular Approach with Fluoroscopic Guidance  Patient: Iric Beymer.      Date of Birth: 1951-09-27 MRN: [id number] PCP: Biagio Borg, MD      Visit Date: 08/29/2021   Universal Protocol:    Date/Time: 08/29/2021  Consent Given By: the patient  Position: PRONE  Additional Comments: Vital signs were monitored before and after the procedure. Patient was prepped and draped in the usual sterile fashion. The correct patient, procedure, and site was  verified.   Injection Procedure Details:   Procedure diagnoses: Lumbar radiculopathy [M54.16]    Meds Administered:  Meds ordered this encounter  Medications   methylPREDNISolone acetate (DEPO-MEDROL) injection 80 mg    Laterality: Right  Location/Site: L4  Needle:5.0 in., 22 ga.  Short bevel or Quincke spinal needle  Needle Placement: Transforaminal  Findings:    -Comments: Excellent flow of contrast along the nerve, nerve root and into the epidural space.  Procedure Details: After squaring off the end-plates to get a true AP view, the C-arm was positioned so that an oblique view of the foramen as noted above was visualized. The target area is just inferior to the "nose of the scotty dog" or sub pedicular. The soft tissues overlying this structure were infiltrated with 2-3 ml. of 1% Lidocaine without Epinephrine.  The spinal needle was inserted toward the target using a "trajectory" view along the fluoroscope beam.  Under AP and lateral visualization, the needle was advanced so it did not puncture dura and was located close the 6 O'Clock position of the pedical in AP tracterory. Biplanar projections were used to confirm position. Aspiration was confirmed to be negative for CSF and/or blood. A 1-2 ml. volume of Isovue-250 was injected and flow of contrast was noted at each level. Radiographs were obtained for documentation purposes.   After attaining the desired flow of contrast documented above, a 0.5 to 1.0 ml test dose of 0.25% Marcaine was injected into each respective  transforaminal space.  The patient was observed for 90 seconds post injection.  After no sensory deficits were reported, and normal lower extremity motor function was noted,   the above injectate was administered so that equal amounts of the injectate were placed at each foramen (level) into the transforaminal epidural space.   Additional Comments:  No complications occurred Dressing: 2 x 2 sterile gauze and  Band-Aid    Post-procedure details: Patient was observed during the procedure. Post-procedure instructions were reviewed.  Patient left the clinic in stable condition.    Clinical History: MRI LUMBAR SPINE WITHOUT CONTRAST   TECHNIQUE: Multiplanar, multisequence MR imaging of the lumbar spine was performed. No intravenous contrast was administered.   COMPARISON:  Lumbar spine radiograph 07/19/2021, CT abdomen pelvis 06/22/2020   FINDINGS: Segmentation:  Standard.   Alignment:  Physiologic.   Vertebrae: No fracture, evidence of discitis, or aggressive bone lesion.   Conus medullaris and cauda equina: Conus extends to the L1 level. Conus and cauda equina appear normal.   Paraspinal and other soft tissues: There is a large left renal cyst measuring up to 3.1 cm, unchanged from prior CT in July 2021   Disc levels:   T12-L1: No significant spinal canal or neural foraminal narrowing.   L1-L2: Severe disc height loss a broad-based disc bulging, and bilateral facet arthropathy results in mild bilateral neural foraminal narrowing.   L2-L3: Minimal broad-based disc bulging, ligamentum flavum hypertrophy, results in mild spinal canal and moderate right neural foraminal narrowing.   L3-L4: Bilateral foraminal disc bulging, ligamentum flavum hypertrophy, and bilateral facet arthropathy results in mild spinal canal and bilateral neural foraminal narrowing.   L4-L5: Broad-based disc bulging, ligamentum flavum hypertrophy, and bilateral facet arthropathy results in moderate-severe left and moderate right neural foraminal narrowing. Mild spinal canal narrowing.   L5-S1: Disc height loss with broad-based disc bulging, ligamentum flavum hypertrophy, bilateral facet arthropathy results in moderate bilateral neural foraminal narrowing.   IMPRESSION: Multilevel degenerative changes of the lumbar spine, summarized below:   L1-L2: Mild bilateral neural foraminal narrowing.    L2-L3: Moderate right neural foraminal narrowing at L2-L3. Mild spinal canal narrowing.   L3-L4: Mild spinal canal and bilateral neural foraminal narrowing.   L4-L5: Moderate-severe left and moderate right neural foraminal narrowing. Mild spinal canal narrowing.   L5-S1: Moderate bilateral neural foraminal narrowing.     Electronically Signed   By: Maurine Simmering M.D.   On: 08/03/2021 15:02     Objective:  VS:  HT:    WT:   BMI:     BP:118/83  HR:80bpm  TEMP: ( )  RESP:  Physical Exam Vitals and nursing note reviewed.  Constitutional:      General: He is not in acute distress.    Appearance: Normal appearance. He is not ill-appearing.  HENT:     Head: Normocephalic and atraumatic.     Right Ear: External ear normal.     Left Ear: External ear normal.     Nose: No congestion.  Eyes:     Extraocular Movements: Extraocular movements intact.  Cardiovascular:     Rate and Rhythm: Normal rate.     Pulses: Normal pulses.  Pulmonary:     Effort: Pulmonary effort is normal. No respiratory distress.  Abdominal:     General: There is no distension.     Palpations: Abdomen is soft.  Musculoskeletal:        General: No tenderness or signs of injury.     Cervical back: Neck  supple.     Right lower leg: No edema.     Left lower leg: No edema.     Comments: Patient has good distal strength without clonus.  Skin:    Findings: No erythema or rash.  Neurological:     General: No focal deficit present.     Mental Status: He is alert and oriented to person, place, and time.     Sensory: No sensory deficit.     Motor: No weakness or abnormal muscle tone.     Coordination: Coordination normal.  Psychiatric:        Mood and Affect: Mood normal.        Behavior: Behavior normal.     Imaging: No results found.

## 2021-09-02 NOTE — Procedures (Signed)
Lumbosacral Transforaminal Epidural Steroid Injection - Sub-Pedicular Approach with Fluoroscopic Guidance  Patient: Darrell Hoffman.      Date of Birth: 1951-04-22 MRN: [id number] PCP: Biagio Borg, MD      Visit Date: 08/29/2021   Universal Protocol:    Date/Time: 08/29/2021  Consent Given By: the patient  Position: PRONE  Additional Comments: Vital signs were monitored before and after the procedure. Patient was prepped and draped in the usual sterile fashion. The correct patient, procedure, and site was verified.   Injection Procedure Details:   Procedure diagnoses: Lumbar radiculopathy [M54.16]    Meds Administered:  Meds ordered this encounter  Medications   methylPREDNISolone acetate (DEPO-MEDROL) injection 80 mg    Laterality: Right  Location/Site: L4  Needle:5.0 in., 22 ga.  Short bevel or Quincke spinal needle  Needle Placement: Transforaminal  Findings:    -Comments: Excellent flow of contrast along the nerve, nerve root and into the epidural space.  Procedure Details: After squaring off the end-plates to get a true AP view, the C-arm was positioned so that an oblique view of the foramen as noted above was visualized. The target area is just inferior to the "nose of the scotty dog" or sub pedicular. The soft tissues overlying this structure were infiltrated with 2-3 ml. of 1% Lidocaine without Epinephrine.  The spinal needle was inserted toward the target using a "trajectory" view along the fluoroscope beam.  Under AP and lateral visualization, the needle was advanced so it did not puncture dura and was located close the 6 O'Clock position of the pedical in AP tracterory. Biplanar projections were used to confirm position. Aspiration was confirmed to be negative for CSF and/or blood. A 1-2 ml. volume of Isovue-250 was injected and flow of contrast was noted at each level. Radiographs were obtained for documentation purposes.   After attaining the desired flow  of contrast documented above, a 0.5 to 1.0 ml test dose of 0.25% Marcaine was injected into each respective transforaminal space.  The patient was observed for 90 seconds post injection.  After no sensory deficits were reported, and normal lower extremity motor function was noted,   the above injectate was administered so that equal amounts of the injectate were placed at each foramen (level) into the transforaminal epidural space.   Additional Comments:  No complications occurred Dressing: 2 x 2 sterile gauze and Band-Aid    Post-procedure details: Patient was observed during the procedure. Post-procedure instructions were reviewed.  Patient left the clinic in stable condition.

## 2021-09-17 ENCOUNTER — Other Ambulatory Visit: Payer: Self-pay

## 2021-09-17 ENCOUNTER — Ambulatory Visit: Payer: Federal, State, Local not specified - PPO | Admitting: Orthopedic Surgery

## 2021-09-17 ENCOUNTER — Encounter: Payer: Self-pay | Admitting: Orthopedic Surgery

## 2021-09-17 DIAGNOSIS — M545 Low back pain, unspecified: Secondary | ICD-10-CM

## 2021-09-17 NOTE — Progress Notes (Signed)
Office Visit Note   Patient: Darrell Hoffman.           Date of Birth: 01-Nov-1951           MRN: [id number] Visit Date: 09/17/2021 Requested by: Biagio Borg, MD [address] [place],  [place] [address] PCP: Biagio Borg, MD  Subjective: Chief Complaint  Patient presents with   Other    Follow up s/p ESI with Dr Ernestina Patches    HPI: Darrell Hoffman is a 70 year old patient with low back pain.  He had an injection with Dr. Ernestina Patches which did not really help him but did not hurt him.  He is walking better but not 100%.  He does have issues with neuropathy.  He is taking gabapentin.  His weight has been diminished due to hiatal hernia.  Did have surgery for that earlier this year.  He is gaining weight back.  Hard for him to sleep on his back as well as get up from a seated position.  He is also having some issues with balance.  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  He states at times his "right leg does not feel right".  This is the side that we are trying to address with injections.              ROS: All systems reviewed are negative as they relate to the chief complaint within the history of present illness.  Patient denies  fevers or chills.   Assessment & Plan: Visit Diagnoses:  1. Low back pain, unspecified back pain laterality, unspecified chronicity, unspecified whether sciatica present     Plan: Impression is low back pain.  I think he is better clinically today than he was 8 weeks ago when he was laying on the exam table having significant pain.  I think he does have some back problems which will require long-term management.  Would like to try physical therapy 1 time a week further in the next 8 weeks for balance issues.  8-week return with decision for or against epidural steroid injection repeated or surgical referral.  Follow-Up Instructions: Return in about 8 weeks (around 11/12/2021).   Orders:  No orders of the defined types were placed in this encounter.  No orders  of the defined types were placed in this encounter.     Procedures: No procedures performed   Clinical Data: No additional findings.  Objective: Vital Signs: There were no vitals taken for this visit.  Physical Exam:   Constitutional: Patient appears well-developed HEENT:  Head: Normocephalic Eyes:EOM are normal Neck: Normal range of motion Cardiovascular: Normal rate Pulmonary/chest: Effort normal Neurologic: Patient is alert Skin: Skin is warm Psychiatric: Patient has normal mood and affect   Ortho Exam: Ortho exam demonstrates slightly antalgic gait to the right.  Pedal pulses palpable.  Ankle dorsiflexion intact.  Rest of his exam is essentially unchanged.  No nerve root tension signs today.  Does have a little bit of pain with forward and lateral bending.  Specialty Comments:  No specialty comments available.  Imaging: No results found.   PMFS History: Patient Active Problem List   Diagnosis Date Noted   Neuropathy 05/29/2021   Malnutrition of moderate degree 01/05/2021   Gastric outlet obstruction 01/01/2021   Hiatal hernia with obstruction but no gangrene 01/01/2021   Preoperative cardiovascular examination 11/21/2020   Bilateral leg pain 08/23/2020   Dysphagia 08/09/2020   Tetrahydrocannabinol (THC) use disorder, moderate, dependence ([place]) 08/09/2020  Weight loss 06/07/2020   Vitamin D deficiency 06/07/2020   Cough 02/10/2019   Subacromial bursitis of left shoulder joint 01/19/2019   CKD (chronic kidney disease) stage 3, GFR 30-59 ml/min (HCC) 03/31/2018   Hyperglycemia 09/25/2017   Daytime somnolence 09/25/2017   Finger pain, right 03/27/2017   History of colonic polyps 03/27/2016   Pre-ulcerative corn or callous 03/27/2016   Skin nodule 01/10/2016   Venereal disease, unspecified 03/23/2012   Renal insufficiency 04/02/2011   Right elbow pain 04/02/2011   Encounter for well adult exam with abnormal findings 03/30/2011   ANEMIA-B12 DEFICIENCY  12/04/2009   ANEMIA-IRON DEFICIENCY 11/27/2009   B12 deficiency 10/06/2008   KNEE PAIN, RIGHT 10/08/2007   Hyperlipidemia 10/03/2007   Morbid obesity ([place]) 10/03/2007   ERECTILE DYSFUNCTION 10/03/2007   Essential hypertension 10/03/2007   ALLERGIC RHINITIS 10/03/2007   Asthma 10/03/2007   GERD 10/03/2007   ALCOHOL ABUSE, HX OF 10/03/2007   COLONIC POLYPS, HX OF 10/03/2007   Past Medical History:  Diagnosis Date   ALCOHOL ABUSE, HX OF 10/03/2007   ALLERGIC RHINITIS 10/03/2007   ANEMIA-B12 DEFICIENCY 12/04/2009   ANEMIA-IRON DEFICIENCY 11/27/2009   ANEMIA-NOS 10/08/2007   ASTHMA 10/03/2007   Asthma    B12 DEFICIENCY 10/06/2008   CKD (chronic kidney disease) stage 3, GFR 30-59 ml/min (HCC) 03/31/2018   COLONIC POLYPS, HX OF 10/03/2007   ERECTILE DYSFUNCTION 10/03/2007   GERD 10/03/2007   History of hiatal hernia    found on CXT in June 2021   HYPERLIPIDEMIA 10/03/2007   HYPERTENSION 10/03/2007   KNEE PAIN, RIGHT 10/08/2007   Morbid obesity ([place]) 10/03/2007   Neuromuscular disorder ([place])    per pt. never diagnosed   Substance abuse ([place])    marijuana daily    Family History  Problem Relation Age of Onset   Breast cancer Mother    Lupus Sister    Hypertension Sister    Diabetes Sister    Alcohol abuse Paternal Uncle    Coronary artery disease Other        male 1st degree relative - MI at 69 yo   Diabetes Cousin        2 cousins   Colon cancer Neg Hx    Esophageal cancer Neg Hx    Rectal cancer Neg Hx    Stomach cancer Neg Hx    Colon polyps Neg Hx     Past Surgical History:  Procedure Laterality Date   bilateral hydrocele repair     COLONOSCOPY  06/06/2016   POLYPECTOMY  01-03-2010   s/p left knee arthroscopy  2009   s/p TKR  03/2009   VASECTOMY     XI ROBOTIC ASSISTED HIATAL HERNIA REPAIR N/A 01/04/2021   Procedure: XI ROBOTIC ASSISTED HIATAL HERNIA REPAIR AND FUNDOPLICATION WITH MESH;  Surgeon: Ralene Ok, MD;  Location: [facility];  Service: General;   Laterality: N/A;   Social History   Occupational History   Occupation: Research officer, political party: [employer]  Tobacco Use   Smoking status: Some Days   Smokeless tobacco: Never  Vaping Use   Vaping Use: Never used  Substance and Sexual Activity   Alcohol use: Not Currently    Alcohol/week: 0.0 standard drinks   Drug use: Yes    Types: Marijuana    Comment: marijuana daily   Sexual activity: Not on file

## 2021-11-12 ENCOUNTER — Other Ambulatory Visit: Payer: Self-pay

## 2021-11-12 ENCOUNTER — Encounter: Payer: Self-pay | Admitting: Orthopedic Surgery

## 2021-11-12 ENCOUNTER — Ambulatory Visit: Payer: Federal, State, Local not specified - PPO | Admitting: Orthopedic Surgery

## 2021-11-12 DIAGNOSIS — M545 Low back pain, unspecified: Secondary | ICD-10-CM

## 2021-11-13 ENCOUNTER — Encounter: Payer: Self-pay | Admitting: Orthopedic Surgery

## 2021-11-13 NOTE — Progress Notes (Signed)
Office Visit Note   Patient: Darrell Hoffman.           Date of Birth: [birth year]           MRN: [id number] Visit Date: 11/12/2021 Requested by: Biagio Borg, MD [place],  [place] [address] PCP: Biagio Borg, MD  Subjective: Chief Complaint  Patient presents with   Lower Back - Follow-up    HPI: Darrell Hoffman is a 70 year old patient with low back pain and right leg pain and weakness.  Here to discuss epidural steroid injection versus surgery.  He is taking gabapentin for pain and neuropathy.  Still has unexplained weight loss.  He states that he feels like he is "wearing shoes" when he is walking around barefoot.  He has had 1 epidural steroid injection in the recent past which did not help with his neuropathy but did help with the back and leg pain.  Has not seen his general practitioner since his weight loss began.  He has not been doing physical therapy.  Because of an issue with scheduling on our part.  He is using a cane.  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              ROS: All systems reviewed are negative as they relate to the chief complaint within the history of present illness.  Patient denies  fevers or chills.   Assessment & Plan: Visit Diagnoses:  1. Low back pain, unspecified back pain laterality, unspecified chronicity, unspecified whether sciatica present     Plan: Impression is unexplained weight loss with appointment with his primary care physician in the near future.  He does have some weakness with ankle dorsiflexion on the right which does correlate with his back findings.  We are going to send him to physical therapy for balance training and range of motion and strengthening 3 times a week for 4 weeks.  I would like him to see Dr. Lorin Hoffman for surgical evaluation for this right lower extremity weakness.  I am not sure if this is a surgical problem or not but I think we do need to get a back surgeon to weigh in on this particularly with his  history of requiring assistive devices.  Follow-Up Instructions: No follow-ups on file.   Orders:  Orders Placed This Encounter  Procedures   Ambulatory referral to Physical Therapy   Ambulatory referral to Orthopedic Surgery   No orders of the defined types were placed in this encounter.     Procedures: No procedures performed   Clinical Data: No additional findings.  Objective: Vital Signs: There were no vitals taken for this visit.  Physical Exam:   Constitutional: Patient appears well-developed HEENT:  Head: Normocephalic Eyes:EOM are normal Neck: Normal range of motion Cardiovascular: Normal rate Pulmonary/chest: Effort normal Neurologic: Patient is alert Skin: Skin is warm Psychiatric: Patient has normal mood and affect   Ortho Exam: Ortho exam demonstrates ankle dorsiflexion weakness 4 out of 5 on the right 5 out of 5 on the left.  Plantarflexion strength is symmetric bilaterally with 5 out of 5 hip flexion quad and hamstring strength.  No definite muscle atrophy in the legs.  Does have neuropathy with diminished sensation consistent with known diagnosis of peripheral neuropathy.  Negative clonus.  Does have generalized muscle atrophy but is not asymmetric right versus left.  Specialty Comments:  No specialty comments available.  Imaging: No results found.   PMFS History: Patient  Active Problem List   Diagnosis Date Noted   Neuropathy 05/29/2021   Malnutrition of moderate degree 01/05/2021   Gastric outlet obstruction 01/01/2021   Hiatal hernia with obstruction but no gangrene 01/01/2021   Preoperative cardiovascular examination 11/21/2020   Bilateral leg pain 08/23/2020   Dysphagia 08/09/2020   Tetrahydrocannabinol (THC) use disorder, moderate, dependence ([place]) 08/09/2020   Weight loss 06/07/2020   Vitamin D deficiency 06/07/2020   Cough 02/10/2019   Subacromial bursitis of left shoulder joint 01/19/2019   CKD (chronic kidney disease) stage 3, GFR  30-59 ml/min (HCC) 03/31/2018   Hyperglycemia 09/25/2017   Daytime somnolence 09/25/2017   Finger pain, right 03/27/2017   History of colonic polyps 03/27/2016   Pre-ulcerative corn or callous 03/27/2016   Skin nodule 01/10/2016   Venereal disease, unspecified 03/23/2012   Renal insufficiency 04/02/2011   Right elbow pain 04/02/2011   Encounter for well adult exam with abnormal findings 03/30/2011   ANEMIA-B12 DEFICIENCY 12/04/2009   ANEMIA-IRON DEFICIENCY 11/27/2009   B12 deficiency 10/06/2008   KNEE PAIN, RIGHT 10/08/2007   Hyperlipidemia 10/03/2007   Morbid obesity ([place]) 10/03/2007   ERECTILE DYSFUNCTION 10/03/2007   Essential hypertension 10/03/2007   ALLERGIC RHINITIS 10/03/2007   Asthma 10/03/2007   GERD 10/03/2007   ALCOHOL ABUSE, HX OF 10/03/2007   COLONIC POLYPS, HX OF 10/03/2007   Past Medical History:  Diagnosis Date   ALCOHOL ABUSE, HX OF 10/03/2007   ALLERGIC RHINITIS 10/03/2007   ANEMIA-B12 DEFICIENCY 12/04/2009   ANEMIA-IRON DEFICIENCY 11/27/2009   ANEMIA-NOS 10/08/2007   ASTHMA 10/03/2007   Asthma    B12 DEFICIENCY 10/06/2008   CKD (chronic kidney disease) stage 3, GFR 30-59 ml/min (HCC) 03/31/2018   COLONIC POLYPS, HX OF 10/03/2007   ERECTILE DYSFUNCTION 10/03/2007   GERD 10/03/2007   History of hiatal hernia    found on CXT in June 2021   HYPERLIPIDEMIA 10/03/2007   HYPERTENSION 10/03/2007   KNEE PAIN, RIGHT 10/08/2007   Morbid obesity ([place]) 10/03/2007   Neuromuscular disorder ([place])    per pt. never diagnosed   Substance abuse ([place])    marijuana daily    Family History  Problem Relation Age of Onset   Breast cancer Mother    Lupus Sister    Hypertension Sister    Diabetes Sister    Alcohol abuse Paternal Uncle    Coronary artery disease Other        male 1st degree relative - MI at 69 yo   Diabetes Cousin        2 cousins   Colon cancer Neg Hx    Esophageal cancer Neg Hx    Rectal cancer Neg Hx    Stomach cancer Neg Hx    Colon  polyps Neg Hx     Past Surgical History:  Procedure Laterality Date   bilateral hydrocele repair     COLONOSCOPY  06/06/2016   POLYPECTOMY  01-03-2010   s/p left knee arthroscopy  2009   s/p TKR  03/2009   VASECTOMY     XI ROBOTIC ASSISTED HIATAL HERNIA REPAIR N/A 01/04/2021   Procedure: XI ROBOTIC ASSISTED HIATAL HERNIA REPAIR AND FUNDOPLICATION WITH MESH;  Surgeon: Ralene Ok, MD;  Location: [facility];  Service: General;  Laterality: N/A;   Social History   Occupational History   Occupation: Research officer, political party: [employer]  Tobacco Use   Smoking status: Some Days   Smokeless tobacco: Never  Vaping Use   Vaping Use: Never used  Substance and Sexual Activity   Alcohol use: Not Currently    Alcohol/week: 0.0 standard drinks   Drug use: Yes    Types: Marijuana    Comment: marijuana daily   Sexual activity: Not on file

## 2021-11-15 ENCOUNTER — Other Ambulatory Visit: Payer: Self-pay

## 2021-11-15 ENCOUNTER — Ambulatory Visit (INDEPENDENT_AMBULATORY_CARE_PROVIDER_SITE_OTHER): Payer: Federal, State, Local not specified - PPO | Admitting: Physical Therapy

## 2021-11-15 ENCOUNTER — Encounter: Payer: Self-pay | Admitting: Physical Therapy

## 2021-11-15 DIAGNOSIS — R2681 Unsteadiness on feet: Secondary | ICD-10-CM

## 2021-11-15 DIAGNOSIS — M545 Low back pain, unspecified: Secondary | ICD-10-CM

## 2021-11-15 DIAGNOSIS — M5416 Radiculopathy, lumbar region: Secondary | ICD-10-CM

## 2021-11-15 DIAGNOSIS — M6281 Muscle weakness (generalized): Secondary | ICD-10-CM

## 2021-11-15 NOTE — Patient Instructions (Signed)
Access Code: [email or web address] URL: [email or web address] Date: 11/15/2021 Prepared by: Almyra Free  Exercises Sit to Stand with Armchair - 1 x daily - 7 x weekly - 3 sets - 10 reps Standing Romberg to 3/4 Tandem Stance - 2 x daily - 7 x weekly - 1 sets - 5-10 reps - max hold

## 2021-11-15 NOTE — Therapy (Signed)
[facility] - Inpatient Physical Therapy [address] [place], [place], [address] Phone: [phone]   Fax:  [phone]  Physical Therapy Evaluation  Patient Details  Name: Darrell Hoffman. MRN: [id number] Date of Birth: 12-Sep-1951 Referring Provider (PT): Marcene Duos MD   Encounter Date: 11/15/2021   PT End of Session - 11/15/21 1300     Visit Number 1    Date for PT Re-Evaluation 01/10/22    Authorization Type BCBS    PT Start Time 1300    PT Stop Time 1351    PT Time Calculation (min) 51 min    Activity Tolerance Patient tolerated treatment well    Behavior During Therapy [facility] for tasks assessed/performed             Past Medical History:  Diagnosis Date   ALCOHOL ABUSE, HX OF 10/03/2007   ALLERGIC RHINITIS 10/03/2007   ANEMIA-B12 DEFICIENCY 12/04/2009   ANEMIA-IRON DEFICIENCY 11/27/2009   ANEMIA-NOS 10/08/2007   ASTHMA 10/03/2007   Asthma    B12 DEFICIENCY 10/06/2008   CKD (chronic kidney disease) stage 3, GFR 30-59 ml/min ([place]) 03/31/2018   COLONIC POLYPS, HX OF 10/03/2007   ERECTILE DYSFUNCTION 10/03/2007   GERD 10/03/2007   History of hiatal hernia    found on CXT in June 2021   [place] 10/03/2007   HYPERTENSION 10/03/2007   KNEE PAIN, RIGHT 10/08/2007   Morbid obesity ([place]) 10/03/2007   Neuromuscular disorder ([place])    per pt. never diagnosed   Substance abuse ([place])    marijuana daily    Past Surgical History:  Procedure Laterality Date   bilateral hydrocele repair     COLONOSCOPY  06/06/2016   POLYPECTOMY  01-03-2010   s/p left knee arthroscopy  2009   s/p TKR  03/2009   VASECTOMY     XI ROBOTIC ASSISTED HIATAL HERNIA REPAIR N/A 01/04/2021   Procedure: XI ROBOTIC ASSISTED HIATAL HERNIA REPAIR AND FUNDOPLICATION WITH MESH;  Surgeon: Ralene Ok, MD;  Location: [facility];  Service: General;  Laterality: N/A;    There were no vitals filed for this visit.    Subjective Assessment - 11/15/21 1303     Subjective Patient reports  he is losing strength on his right side; He has been using a SPC for 2 weeks. He has to pull himself up from sitting. Doesn't think he could do stairs.He has some pain in his right leg but also has neuropathy in bil feet. Recently lost about 100# due to hiatal hernia and loss of appetite. His appetite is returning but he feels he is malnourished.    Pertinent History multiple falls, Rt TKR, LT knee scope, Bil neuropathy    How long can you walk comfortably? not sure    Patient Stated Goals not use a cane. get strength back    Currently in Pain? Yes    Pain Location Leg    Pain Orientation Right    Pain Descriptors / Indicators Tingling    Pain Type Chronic pain    Pain Onset More than a month ago    Pain Frequency Constant    Aggravating Factors  when not taking gabapentin    Pain Relieving Factors meds    Effect of Pain on Daily Activities he isn't very active due to weakness and neuropathy                [facility] PT Assessment - 11/15/21 0001       Assessment   Medical Diagnosis LBP  Referring Provider (PT) Marcene Duos MD    Onset Date/Surgical Date 02/13/21    Hand Dominance Right    Next MD Visit seeing Dr. Lorin Mercy this month      Precautions   Precautions Fall      Restrictions   Weight Bearing Restrictions No      Balance Screen   Has the patient fallen in the past 6 months Yes    How many times? 6   walking 1x, fell at work once, tripped a few times   Has the patient had a decrease in activity level because of a fear of falling?  Yes    Is the patient reluctant to leave their home because of a fear of falling?  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#)       Observation/Other Assessments   Focus on Therapeutic Outcomes (FOTO)  44 (predicted 63)      ROM / Strength   AROM / PROM / Strength AROM;Strength      AROM   Overall AROM Comments lumbar WFL except rotation only 25% shift and LOB to right      Strength   Strength Assessment Site Hip;Knee;Ankle    Right/Left Hip Right;Left    Right Hip Flexion 4/5    Right Hip External Rotation  4+/5    Right Hip Internal Rotation 5/5    Right Hip ABduction 3+/5    Left Hip Flexion 4+/5    Left Hip External Rotation 5/5    Left Hip Internal Rotation 5/5    Left Hip ABduction 4-/5    Right/Left Knee Right;Left    Right Knee Flexion 4/5    Right Knee Extension 5/5    Left Knee Flexion 4+/5    Left Knee Extension 4+/5    Right/Left Ankle Right;Left    Right Ankle Dorsiflexion 2+/5    Right Ankle Inversion 3+/5    Right Ankle Eversion 4-/5    Left Ankle Dorsiflexion 4-/5    Left Ankle Inversion 5/5    Left Ankle Eversion 4/5      Flexibility   Soft Tissue Assessment /Muscle Length yes    Hamstrings left tight    Quadriceps right tight      Standardized Balance Assessment   Standardized Balance Assessment Five Times Sit to Stand;Berg Balance Test    Five times sit to stand comments  18.17 sec with bil UE assist      Berg Balance Test   Sit to Stand Able to stand  independently using hands    Standing Unsupported Able to stand safely 2 minutes    Sitting with Back Unsupported but Feet Supported on Floor or Stool Able to sit safely and securely 2 minutes    Stand to Sit Controls descent by using hands    Transfers Able to transfer safely, minor use of hands    Standing Unsupported with Eyes Closed Able to stand 10 seconds safely    Standing Unsupported with Feet Together Able to place feet together independently and stand 1 minute safely    From Standing, Reach Forward with Outstretched Arm Can reach forward >12  cm safely (5")    From Standing Position, Pick up Object from [place] to  pick up shoe safely and easily    From Standing Position, Turn to Look Behind Over each Shoulder Needs supervision when turning    Turn 360 Degrees Able to turn 360 degrees safely one side only in 4 seconds or less   slower to the right   Standing Unsupported, Alternately Place Feet on Step/Stool Able to stand independently and complete 8 steps >20 seconds    Standing Unsupported, One Foot in [place] to take small step independently and hold 30 seconds    Standing on One Leg Tries to lift leg/unable to hold 3 seconds but remains standing independently    Total Score 43    Berg comment: 43/56                        Objective measurements completed on examination: See above findings.                PT Education - 11/15/21 1559     Education Details HEP; POC; adjusted cane to correct height and worked on using in left UE vs. Rt    Person(s) Educated Patient    Methods Explanation;Demonstration;Handout    Comprehension Verbalized understanding;Returned demonstration              PT Short Term Goals - 11/15/21 1603       PT SHORT TERM GOAL #1   Title ind with initial HEP for strength and balance    Time 3    Period Weeks    Status New    Target Date 12/06/21      PT SHORT TERM GOAL #2   Title Pt able to perform sit to stand without UE assist    Time 4    Period Weeks    Status New    Target Date 12/13/21               PT Long Term Goals - 11/15/21 1604       PT LONG TERM GOAL #1   Title improved BERG to >= 50/56 to decrease fall risk.    Time 8    Period Weeks    Status New    Target Date 01/10/22      PT LONG TERM GOAL #2   Title improved 5x sit to stand to <= 11.4 sec to decrease fall risk    Time 8    Period Weeks    Status New      PT LONG TERM GOAL #3   Title improved BLE strength to >= 4+/5 to help normalize functional mobility and balance    Time 8    Period Weeks    Status New      PT LONG TERM GOAL #4   Title  Pt able to safely ambulate 500 ft indoor/outdoor surfaces with least restrictive AD    Time 8    Period Weeks    Status New      PT LONG TERM GOAL #5   Title improved FOTO  to 63    Baseline 44    Time 8    Period Weeks    Status New                    Plan - 11/15/21 1551     Clinical Impression Statement Patient present to PT with complaints of right LE weakness,  bil foot pain and N/T and unsteadiness. He reports 6 falls in the past 6 months. He has significant weakness in BLE including bil ankles affecting gait. His neuropathy is affecting his balance and he is a fall risk as indicated by his BERG score of 43/56 and his 5x sit to stand of 18.17 sec with BUE assist. He also has weak core stabilization with MMT of BLE. He has pain in bil feet when not taking his gabapentin. He denies low back pain. He will benefit from skilled PT to address these deficits.    Personal Factors and Comorbidities Comorbidity 3+    Comorbidities multiple falls, Rt TKR, LT knee scope, Bil neuropathy    Examination-Activity Limitations Stairs;Locomotion Level    Stability/Clinical Decision Making Evolving/Moderate complexity    Clinical Decision Making Moderate    Rehab Potential Good    PT Frequency 2x / week    PT Duration 8 weeks    PT Treatment/Interventions ADLs/Self Care Home Management;Aquatic Therapy;Cryotherapy;Moist Heat;Neuromuscular re-education;Balance training;Therapeutic exercise;Therapeutic activities;Functional mobility training;Stair training;Gait training;Patient/family education;Manual techniques;Dry needling    PT Next Visit Plan LE strength, balance, gait/stairs, gastroc stretch    PT Home Exercise Plan [email or web address]    Consulted and Agree with Plan of Care Patient             Patient will benefit from skilled therapeutic intervention in order to improve the following deficits and impairments:  Abnormal gait, Difficulty walking, Decreased activity tolerance, Pain, Decreased  balance, Impaired flexibility, Impaired sensation, Decreased strength  Visit Diagnosis: Unsteadiness on feet - Plan: PT plan of care cert/re-cert  Radiculopathy, lumbar region - Plan: PT plan of care cert/re-cert  Muscle weakness (generalized) - Plan: PT plan of care cert/re-cert     Problem List Patient Active Problem List   Diagnosis Date Noted   Neuropathy 05/29/2021   Malnutrition of moderate degree 01/05/2021   Gastric outlet obstruction 01/01/2021   Hiatal hernia with obstruction but no gangrene 01/01/2021   Preoperative cardiovascular examination 11/21/2020   Bilateral leg pain 08/23/2020   Dysphagia 08/09/2020   Tetrahydrocannabinol (THC) use disorder, moderate, dependence ([place]) 08/09/2020   Weight loss 06/07/2020   Vitamin D deficiency 06/07/2020   Cough 02/10/2019   Subacromial bursitis of left shoulder joint 01/19/2019   CKD (chronic kidney disease) stage 3, GFR 30-59 ml/min (HCC) 03/31/2018   Hyperglycemia 09/25/2017   Daytime somnolence 09/25/2017   Finger pain, right 03/27/2017   History of colonic polyps 03/27/2016   Pre-ulcerative corn or callous 03/27/2016   Skin nodule 01/10/2016   Venereal disease, unspecified 03/23/2012   Renal insufficiency 04/02/2011   Right elbow pain 04/02/2011   Encounter for well adult exam with abnormal findings 03/30/2011   ANEMIA-B12 DEFICIENCY 12/04/2009   ANEMIA-IRON DEFICIENCY 11/27/2009   B12 deficiency 10/06/2008   KNEE PAIN, RIGHT 10/08/2007   Hyperlipidemia 10/03/2007   Morbid obesity ([place]) 10/03/2007   ERECTILE DYSFUNCTION 10/03/2007   Essential hypertension 10/03/2007   ALLERGIC RHINITIS 10/03/2007   Asthma 10/03/2007   GERD 10/03/2007   ALCOHOL ABUSE, HX OF 10/03/2007   COLONIC POLYPS, HX OF 10/03/2007    Madelyn Flavors, PT 11/15/2021, 4:14 PM  [facility] [address] [place], [place], [address] Phone: [phone]   Fax:  [phone]  Name: Darrell Hoffman. MRN:  [id number] Date of Birth: 10-26-1951

## 2021-11-19 ENCOUNTER — Other Ambulatory Visit: Payer: Self-pay

## 2021-11-19 ENCOUNTER — Ambulatory Visit: Payer: Federal, State, Local not specified - PPO | Admitting: Physical Therapy

## 2021-11-19 DIAGNOSIS — M6281 Muscle weakness (generalized): Secondary | ICD-10-CM

## 2021-11-19 DIAGNOSIS — M5416 Radiculopathy, lumbar region: Secondary | ICD-10-CM

## 2021-11-19 DIAGNOSIS — R2681 Unsteadiness on feet: Secondary | ICD-10-CM

## 2021-11-19 NOTE — Therapy (Signed)
[facility] Physical Therapy [address] [place], [place], [address] Phone: [phone]   Fax:  [phone]  Physical Therapy Treatment  Patient Details  Name: Darrell Hoffman. MRN: [id number] Date of Birth: 29-Nov-1951 Referring Provider (PT): Marcene Duos MD   Encounter Date: 11/19/2021   PT End of Session - 11/19/21 1441     Visit Number 2    Number of Visits 16    Date for PT Re-Evaluation 01/10/22    Authorization Type BCBS    PT Start Time 1345    PT Stop Time 7616    PT Time Calculation (min) 40 min    Activity Tolerance Patient tolerated treatment well    Behavior During Therapy [facility] for tasks assessed/performed             Past Medical History:  Diagnosis Date   ALCOHOL ABUSE, HX OF 10/03/2007   ALLERGIC RHINITIS 10/03/2007   ANEMIA-B12 DEFICIENCY 12/04/2009   ANEMIA-IRON DEFICIENCY 11/27/2009   ANEMIA-NOS 10/08/2007   ASTHMA 10/03/2007   Asthma    B12 DEFICIENCY 10/06/2008   CKD (chronic kidney disease) stage 3, GFR 30-59 ml/min ([place]) 03/31/2018   COLONIC POLYPS, HX OF 10/03/2007   ERECTILE DYSFUNCTION 10/03/2007   GERD 10/03/2007   History of hiatal hernia    found on CXT in June 2021   [place] 10/03/2007   HYPERTENSION 10/03/2007   KNEE PAIN, RIGHT 10/08/2007   Morbid obesity ([place]) 10/03/2007   Neuromuscular disorder ([place])    per pt. never diagnosed   Substance abuse ([place])    marijuana daily    Past Surgical History:  Procedure Laterality Date   bilateral hydrocele repair     COLONOSCOPY  06/06/2016   POLYPECTOMY  01-03-2010   s/p left knee arthroscopy  2009   s/p TKR  03/2009   VASECTOMY     XI ROBOTIC ASSISTED HIATAL HERNIA REPAIR N/A 01/04/2021   Procedure: XI ROBOTIC ASSISTED HIATAL HERNIA REPAIR AND FUNDOPLICATION WITH MESH;  Surgeon: Ralene Ok, MD;  Location: [facility];  Service: General;  Laterality: N/A;    There were no vitals filed for this visit.   Subjective Assessment - 11/19/21 1440      Subjective He relays not much pain overall 2/10 but complains of weakness, neuropathy in his feet and being unsteady.    Pertinent History multiple falls, Rt TKR, LT knee scope, Bil neuropathy    How long can you walk comfortably? not sure    Patient Stated Goals not use a cane. get strength back    Pain Onset More than a month ago               [facility] Adult PT Treatment/Exercise - 11/19/21 0001       Neuro Re-ed    Neuro Re-ed Details  modified tandem balance 30 sec X 2 bilat, balance with feet together and head turns and head nods 30 sec X 2 ea, sidestepping without UE support 3 round trips at counter top      Exercises   Exercises Knee/Hip      Knee/Hip Exercises: Aerobic   Recumbent Bike L3 X 6 min      Knee/Hip Exercises: Standing   Heel Raises Both;10 reps    Knee Flexion Both;10 reps    Hip Abduction Both;10 reps    Forward Step Up Both;10 reps;Hand Hold: 1;Step Height: 4"      Knee/Hip Exercises: Seated   Long Arc Quad Both;10 reps;3 sets    [employer]  Quad Weight 2 lbs.    Sit to Sand 2 sets;5 reps   using hands to stand up then no hands to sit down slowly                      PT Short Term Goals - 11/15/21 1603       PT SHORT TERM GOAL #1   Title ind with initial HEP for strength and balance    Time 3    Period Weeks    Status New    Target Date 12/06/21      PT SHORT TERM GOAL #2   Title Pt able to perform sit to stand without UE assist    Time 4    Period Weeks    Status New    Target Date 12/13/21               PT Long Term Goals - 11/15/21 1604       PT LONG TERM GOAL #1   Title improved BERG to >= 50/56 to decrease fall risk.    Time 8    Period Weeks    Status New    Target Date 01/10/22      PT LONG TERM GOAL #2   Title improved 5x sit to stand to <= 11.4 sec to decrease fall risk    Time 8    Period Weeks    Status New      PT LONG TERM GOAL #3   Title improved BLE strength to >= 4+/5 to help normalize  functional mobility and balance    Time 8    Period Weeks    Status New      PT LONG TERM GOAL #4   Title Pt able to safely ambulate 500 ft indoor/outdoor surfaces with least restrictive AD    Time 8    Period Weeks    Status New      PT LONG TERM GOAL #5   Title improved FOTO  to 63    Baseline 44    Time 8    Period Weeks    Status New                   Plan - 11/19/21 1443     Clinical Impression Statement Session today focused on balance and overall leg strength to his tolreance. We will monitor for any soreness to this next session and adjust intensity accordingly.    Personal Factors and Comorbidities Comorbidity 3+    Comorbidities multiple falls, Rt TKR, LT knee scope, Bil neuropathy    Examination-Activity Limitations Stairs;Locomotion Level    Stability/Clinical Decision Making Evolving/Moderate complexity    Rehab Potential Good    PT Frequency 2x / week    PT Duration 8 weeks    PT Treatment/Interventions ADLs/Self Care Home Management;Aquatic Therapy;Cryotherapy;Moist Heat;Neuromuscular re-education;Balance training;Therapeutic exercise;Therapeutic activities;Functional mobility training;Stair training;Gait training;Patient/family education;Manual techniques;Dry needling    PT Next Visit Plan LE strength, balance, gait/stairs, gastroc stretch    PT Home Exercise Plan [phone]    Consulted and Agree with Plan of Care Patient             Patient will benefit from skilled therapeutic intervention in order to improve the following deficits and impairments:  Abnormal gait, Difficulty walking, Decreased activity tolerance, Pain, Decreased balance, Impaired flexibility, Impaired sensation, Decreased strength  Visit Diagnosis: Unsteadiness on feet  Radiculopathy, lumbar region  Muscle weakness (generalized)     Problem  List Patient Active Problem List   Diagnosis Date Noted   Neuropathy 05/29/2021   Malnutrition of moderate degree 01/05/2021    Gastric outlet obstruction 01/01/2021   Hiatal hernia with obstruction but no gangrene 01/01/2021   Preoperative cardiovascular examination 11/21/2020   Bilateral leg pain 08/23/2020   Dysphagia 08/09/2020   Tetrahydrocannabinol (THC) use disorder, moderate, dependence ([place]) 08/09/2020   Weight loss 06/07/2020   Vitamin D deficiency 06/07/2020   Cough 02/10/2019   Subacromial bursitis of left shoulder joint 01/19/2019   CKD (chronic kidney disease) stage 3, GFR 30-59 ml/min (HCC) 03/31/2018   Hyperglycemia 09/25/2017   Daytime somnolence 09/25/2017   Finger pain, right 03/27/2017   History of colonic polyps 03/27/2016   Pre-ulcerative corn or callous 03/27/2016   Skin nodule 01/10/2016   Venereal disease, unspecified 03/23/2012   Renal insufficiency 04/02/2011   Right elbow pain 04/02/2011   Encounter for well adult exam with abnormal findings 03/30/2011   ANEMIA-B12 DEFICIENCY 12/04/2009   ANEMIA-IRON DEFICIENCY 11/27/2009   B12 deficiency 10/06/2008   KNEE PAIN, RIGHT 10/08/2007   Hyperlipidemia 10/03/2007   Morbid obesity ([place]) 10/03/2007   ERECTILE DYSFUNCTION 10/03/2007   Essential hypertension 10/03/2007   ALLERGIC RHINITIS 10/03/2007   Asthma 10/03/2007   GERD 10/03/2007   ALCOHOL ABUSE, HX OF 10/03/2007   COLONIC POLYPS, HX OF 10/03/2007    Debbe Odea, PT,DPT 11/19/2021, 2:47 PM  [facility] Physical Therapy [address] [place], [place], [address] Phone: [phone]   Fax:  [phone]  Name: Darrell Hoffman. MRN: [id number] Date of Birth: 31-Jan-1951

## 2021-11-21 NOTE — Progress Notes (Signed)
NEUROLOGY FOLLOW UP OFFICE NOTE  Darrell Hoffman [id number]  Assessment/Plan:   Polyneuropathy with polyradiculopathy - given the active on chronic changes and sural sparing on NCV-EMG, CIDP was suspected.  However, CSF analysis was normal.  He started having lumbosacral radicular pain and MRI lumbar spine does show neural foraminal narrowing which can account for the radicular findings on EMG.  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  Subjective:  Darrell Hoffman is a 70 year old right-handed male with HTN, CKD, anemia, and history of alcohol abuse who follows up for neuropathy.  UPDATE: Taking gabapentin 600mg  as needed - taking four times daily when he gets the shooting pain Underwent ESI which is ineffective Still notes numbness and tingling.  Can't feel ground well. Gabapentin helps reduce pain but not resolves. Undergoing physical therapy which is helping with balance, lower extremity and core strength.   Notes that his ankles and feet are swollen.  Tries to keep legs elevated when not on his feet.     HISTORY: He has history of lower extremity pain since late 2021 to early 2022. Workup for PAD was negative.  History of B12 deficiency on supplements with level from December 561.  Hgb A1c was 5.7 and TSH 2.11.  In August 2021, underwent labs for neuromuscular disorder - AChR antibody negative, CK was 251, aldolase 2.4, and sed rate 22.  Pain significantly worsened after hiatal hernia repair in January 2022.  Initially feet feel cold.  Pain in arches and in between sharp pain sharp in legs - wakes up in night - can't sleep - no numbness and tingling - OTC ointment helps short term and massage - not notice while on feet or walking.    Labs from 02/06/2021 demonstrated negative ANA, B12 313, ACE 18, B1 22, SPEP/IFE negative for M-spike.  NCV-EMG on 03/13/2021 showed active on chronic polyradiculopathy with predominantly axonal features and sural  sparing of the lower extremities.  To evaluate for CIDP, underwent lumbar on 04/05/2021 which showed normal protein of 39, glucose 49, cell count 1, negative cytology, ACE 5, negative Lyme and negative culture.  Underwent 5 days of IV Solu-Medrol in June No improvement.  Gabapentin 600mg  three times daily which helped.  3 weeks ago he developed right leg pain.  Knee etiology was ruled out.  Followed up with orthopedics.  MRI of lumbar spine on 08/02/2021 showed multilevel degenerative changes with moderate right neural foraminal stenosis at L2-3, moderatge to severe left and moderate right neural foraminal stenosis at L4-5 and moderate bilateral neural foraminal stenosis at L5-S1.   PAST MEDICAL HISTORY: Past Medical History:  Diagnosis Date   ALCOHOL ABUSE, HX OF 10/03/2007   ALLERGIC RHINITIS 10/03/2007   ANEMIA-B12 DEFICIENCY 12/04/2009   ANEMIA-IRON DEFICIENCY 11/27/2009   ANEMIA-NOS 10/08/2007   ASTHMA 10/03/2007   Asthma    B12 DEFICIENCY 10/06/2008   CKD (chronic kidney disease) stage 3, GFR 30-59 ml/min (HCC) 03/31/2018   COLONIC POLYPS, HX OF 10/03/2007   ERECTILE DYSFUNCTION 10/03/2007   GERD 10/03/2007   History of hiatal hernia    found on CXT in June 2021   HYPERLIPIDEMIA 10/03/2007   HYPERTENSION 10/03/2007   KNEE PAIN, RIGHT 10/08/2007   Morbid obesity ([place]) 10/03/2007   Neuromuscular disorder ([place])    per pt. never diagnosed   Substance abuse ([place])    marijuana daily    MEDICATIONS: Current Outpatient Medications on File Prior to Visit  Medication Sig Dispense Refill  acetaminophen-codeine (TYLENOL #3) 300-30 MG tablet Take 1 tablet by mouth every 8 (eight) hours as needed for moderate pain. (Patient not taking: Reported on 11/15/2021) 30 tablet 0   acetaminophen-codeine (TYLENOL #3) 300-30 MG tablet Take 1 tablet by mouth every 4 (four) hours as needed for moderate pain. (Patient not taking: Reported on 11/15/2021) 30 tablet 0   albuterol (PROVENTIL HFA) 108 (90 Base)  MCG/ACT inhaler Inhale 2 puffs into the lungs every 4 (four) hours as needed. (Patient taking differently: Inhale 2 puffs into the lungs every 4 (four) hours as needed for wheezing or shortness of breath.) 18 g 2   ASPIRIN LOW DOSE 81 MG EC tablet TAKE 1 TABLET(81 MG) BY MOUTH DAILY. SWALLOW WHOLE (Patient not taking: Reported on 11/15/2021) 90 tablet 2   CIALIS 20 MG tablet TAKE 1 TABLET BY MOUTH DAILY AS NEEDED 5 tablet 11   fluticasone (FLONASE) 50 MCG/ACT nasal spray Place 2 sprays into both nostrils daily. 16 g 5   gabapentin (NEURONTIN) 600 MG tablet Take 1 tablet (600 mg total) by mouth 4 (four) times daily. 120 tablet 5   glycerin adult 2 g suppository Place 1 suppository rectally daily as needed for constipation. (Patient not taking: Reported on 08/15/2021)     methocarbamol (ROBAXIN) 500 MG tablet Take 1 tablet (500 mg total) by mouth every 8 (eight) hours as needed for muscle spasms. (Patient not taking: Reported on 11/15/2021) 30 tablet 0   omeprazole (PRILOSEC) 40 MG capsule TAKE 1 CAPSULE(40 MG) BY MOUTH IN THE MORNING AND AT BEDTIME (Patient not taking: Reported on 08/15/2021) 60 capsule 2   predniSONE (STERAPRED UNI-PAK 21 TAB) 10 MG (21) TBPK tablet Take as directed on packaging (Patient not taking: Reported on 08/15/2021) 21 tablet 0   vitamin B-12 (CYANOCOBALAMIN) 1000 MCG tablet Take 1 tablet (1,000 mcg total) by mouth daily. (Patient not taking: Reported on 08/15/2021) 90 tablet 3   WIXELA INHUB 500-50 MCG/DOSE AEPB INHALE 1 PUFF INTO THE LUNGS TWICE DAILY (Patient not taking: Reported on 08/15/2021) 60 each 11   No current facility-administered medications on file prior to visit.    ALLERGIES: No Known Allergies  FAMILY HISTORY: Family History  Problem Relation Age of Onset   Breast cancer Mother    Lupus Sister    Hypertension Sister    Diabetes Sister    Alcohol abuse Paternal Uncle    Coronary artery disease Other        male 1st degree relative - MI at 69 yo    Diabetes Cousin        2 cousins   Colon cancer Neg Hx    Esophageal cancer Neg Hx    Rectal cancer Neg Hx    Stomach cancer Neg Hx    Colon polyps Neg Hx       Objective:  Blood pressure (!) 146/83, pulse 95, height 5\' 6"  (1.676 m), weight 165 lb 3.2 oz (74.9 kg), SpO2 100 %. General: No acute distress.  Patient appears well-groomed.   Head:  Normocephalic/atraumatic Eyes:  Fundi examined but not visualized Neck: supple, no paraspinal tenderness, full range of motion Heart:  Regular rate and rhythm Lungs:  Clear to auscultation bilaterally Back: No paraspinal tenderness Neurological Exam: alert and oriented to person, place, and time.  Speech fluent and not dysarthric, language intact.  CN II-XII intact. Bulk and tone normal, muscle strength 5/5 throughout.  Sensation to pinprick reduced to just above knee on right and just below knee on left.  Vibratory sensation reduced in toes.  Deep tendon reflexes 2+ throughout except absent in ankles, toes downgoing.  Finger to nose testing intact.  Broad-based gait.  Romberg with sway   Metta Clines, DO  CC: Cathlean Cower, MD

## 2021-11-22 ENCOUNTER — Other Ambulatory Visit: Payer: Self-pay

## 2021-11-22 ENCOUNTER — Ambulatory Visit: Payer: Federal, State, Local not specified - PPO | Admitting: Neurology

## 2021-11-22 ENCOUNTER — Encounter: Payer: Self-pay | Admitting: Rehabilitative and Restorative Service Providers"

## 2021-11-22 ENCOUNTER — Ambulatory Visit: Payer: Federal, State, Local not specified - PPO | Admitting: Rehabilitative and Restorative Service Providers"

## 2021-11-22 VITALS — BP 146/83 | HR 95 | Ht 66.0 in | Wt 165.2 lb

## 2021-11-22 DIAGNOSIS — M5416 Radiculopathy, lumbar region: Secondary | ICD-10-CM | POA: Diagnosis not present

## 2021-11-22 DIAGNOSIS — G609 Hereditary and idiopathic neuropathy, unspecified: Secondary | ICD-10-CM | POA: Diagnosis not present

## 2021-11-22 DIAGNOSIS — M6281 Muscle weakness (generalized): Secondary | ICD-10-CM | POA: Diagnosis not present

## 2021-11-22 DIAGNOSIS — R2681 Unsteadiness on feet: Secondary | ICD-10-CM

## 2021-11-22 NOTE — Therapy (Signed)
[facility] Physical Therapy [address] [place], [place], [address] Phone: [phone]   Fax:  [phone]  Physical Therapy Treatment  Patient Details  Name: Darrell Hoffman. MRN: [id number] Date of Birth: [birth year] Referring Provider (PT): Marcene Duos MD   Encounter Date: 11/22/2021   PT End of Session - 11/22/21 1301     Visit Number 3    Number of Visits 16    Date for PT Re-Evaluation 01/10/22    Authorization Type BCBS    PT Start Time 1301    PT Stop Time 1340    PT Time Calculation (min) 39 min    Activity Tolerance Patient tolerated treatment well    Behavior During Therapy WFL for tasks assessed/performed             Past Medical History:  Diagnosis Date   ALCOHOL ABUSE, HX OF 10/03/2007   ALLERGIC RHINITIS 10/03/2007   ANEMIA-B12 DEFICIENCY 12/04/2009   ANEMIA-IRON DEFICIENCY 11/27/2009   ANEMIA-NOS 10/08/2007   ASTHMA 10/03/2007   Asthma    B12 DEFICIENCY 10/06/2008   CKD (chronic kidney disease) stage 3, GFR 30-59 ml/min (HCC) 03/31/2018   COLONIC POLYPS, HX OF 10/03/2007   ERECTILE DYSFUNCTION 10/03/2007   GERD 10/03/2007   History of hiatal hernia    found on CXT in June 2021   [place] 10/03/2007   HYPERTENSION 10/03/2007   KNEE PAIN, RIGHT 10/08/2007   Morbid obesity ([place]) 10/03/2007   Neuromuscular disorder ([place])    per pt. never diagnosed   Substance abuse ([place])    marijuana daily    Past Surgical History:  Procedure Laterality Date   bilateral hydrocele repair     COLONOSCOPY  06/06/2016   POLYPECTOMY  01-03-2010   s/p left knee arthroscopy  2009   s/p TKR  03/2009   VASECTOMY     XI ROBOTIC ASSISTED HIATAL HERNIA REPAIR N/A 01/04/2021   Procedure: XI ROBOTIC ASSISTED HIATAL HERNIA REPAIR AND FUNDOPLICATION WITH MESH;  Surgeon: Ralene Ok, MD;  Location: [facility];  Service: General;  Laterality: N/A;    There were no vitals filed for this visit.   Subjective Assessment - 11/22/21 1321      Subjective Pt. indicated he didn't have back pain.  Pt. stated he strained his Rt leg in the last few days but it is getting some better.    Pertinent History multiple falls, Rt TKR, LT knee scope, Bil neuropathy    How long can you walk comfortably? not sure    Patient Stated Goals not use a cane. get strength back    Currently in Pain? No/denies    Pain Location Back    Pain Onset More than a month ago                [facility] PT Assessment - 11/22/21 0001       Assessment   Medical Diagnosis LBP    Referring Provider (PT) Marcene Duos MD    Onset Date/Surgical Date 02/13/21    Hand Dominance Right      Ambulation/Gait   Gait Pattern Decreased step length - left;Decreased step length - right;Wide base of support      Balance   Balance Assessed Yes                           [facility] Adult PT Treatment/Exercise - 11/22/21 0001       Neuro Re-ed    Neuro Re-ed Details  modified tandem stance (forward step positioning but not directly in front) 45 seconds x 2 bilateral, lateral stepping 10 ft x 3 each way in // bars, alternate toe tapping 6 inch step 2 x 10 bilateral in // bars, feet together eyes closed 30 sec c SBA      Knee/Hip Exercises: Aerobic   Nustep Lvl 5 UE/LE 8 mins      Knee/Hip Exercises: Seated   Long Arc Quad Both;3 sets;10 reps    Long Arc Quad Weight 3 lbs.    Sit to [employer] 15 reps;Other (comment)   18 inch chair plus 2 inch airex foam pad.  UE on knees                      PT Short Term Goals - 11/22/21 1321       PT SHORT TERM GOAL #1   Title ind with initial HEP for strength and balance    Time 3    Period Weeks    Status On-going    Target Date 12/06/21      PT SHORT TERM GOAL #2   Title Pt able to perform sit to stand without UE assist    Time 4    Period Weeks    Status On-going    Target Date 12/13/21               PT Long Term Goals - 11/15/21 1604       PT LONG TERM GOAL #1   Title improved BERG to  >= 50/56 to decrease fall risk.    Time 8    Period Weeks    Status New    Target Date 01/10/22      PT LONG TERM GOAL #2   Title improved 5x sit to stand to <= 11.4 sec to decrease fall risk    Time 8    Period Weeks    Status New      PT LONG TERM GOAL #3   Title improved BLE strength to >= 4+/5 to help normalize functional mobility and balance    Time 8    Period Weeks    Status New      PT LONG TERM GOAL #4   Title Pt able to safely ambulate 500 ft indoor/outdoor surfaces with least restrictive AD    Time 8    Period Weeks    Status New      PT LONG TERM GOAL #5   Title improved FOTO  to 63    Baseline 44    Time 8    Period Weeks    Status New                   Plan - 11/22/21 1331     Clinical Impression Statement Continued difficulty noted in balance intervention, particularily narrow based control.  Presentation of Rt leg difficulty more than Lt noted.  Recommendation of continued skilled PT services for strengthening and balance improvements.  Cues given for sit to stand transfers at home c focus on slow controlled lowering for strength improvements.    Personal Factors and Comorbidities Comorbidity 3+    Comorbidities multiple falls, Rt TKR, LT knee scope, Bil neuropathy    Examination-Activity Limitations Stairs;Locomotion Level    Stability/Clinical Decision Making Evolving/Moderate complexity    Rehab Potential Good    PT Frequency 2x / week    PT Duration 8 weeks    PT Treatment/Interventions ADLs/Self Care  Home Management;Aquatic Therapy;Cryotherapy;Moist Heat;Neuromuscular re-education;Balance training;Therapeutic exercise;Therapeutic activities;Functional mobility training;Stair training;Gait training;Patient/family education;Manual techniques;Dry needling    PT Next Visit Plan Progressive strengthening and balance intervention continued.    PT Home Exercise Plan [phone]    Consulted and Agree with Plan of Care Patient              Patient will benefit from skilled therapeutic intervention in order to improve the following deficits and impairments:  Abnormal gait, Difficulty walking, Decreased activity tolerance, Pain, Decreased balance, Impaired flexibility, Impaired sensation, Decreased strength  Visit Diagnosis: Unsteadiness on feet  Radiculopathy, lumbar region  Muscle weakness (generalized)     Problem List Patient Active Problem List   Diagnosis Date Noted   Neuropathy 05/29/2021   Malnutrition of moderate degree 01/05/2021   Gastric outlet obstruction 01/01/2021   Hiatal hernia with obstruction but no gangrene 01/01/2021   Preoperative cardiovascular examination 11/21/2020   Bilateral leg pain 08/23/2020   Dysphagia 08/09/2020   Tetrahydrocannabinol (THC) use disorder, moderate, dependence ([place]) 08/09/2020   Weight loss 06/07/2020   Vitamin D deficiency 06/07/2020   Cough 02/10/2019   Subacromial bursitis of left shoulder joint 01/19/2019   CKD (chronic kidney disease) stage 3, GFR 30-59 ml/min (HCC) 03/31/2018   Hyperglycemia 09/25/2017   Daytime somnolence 09/25/2017   Finger pain, right 03/27/2017   History of colonic polyps 03/27/2016   Pre-ulcerative corn or callous 03/27/2016   Skin nodule 01/10/2016   Venereal disease, unspecified 03/23/2012   Renal insufficiency 04/02/2011   Right elbow pain 04/02/2011   Encounter for well adult exam with abnormal findings 03/30/2011   ANEMIA-B12 DEFICIENCY 12/04/2009   ANEMIA-IRON DEFICIENCY 11/27/2009   B12 deficiency 10/06/2008   KNEE PAIN, RIGHT 10/08/2007   Hyperlipidemia 10/03/2007   Morbid obesity ([place]) 10/03/2007   ERECTILE DYSFUNCTION 10/03/2007   Essential hypertension 10/03/2007   ALLERGIC RHINITIS 10/03/2007   Asthma 10/03/2007   GERD 10/03/2007   ALCOHOL ABUSE, HX OF 10/03/2007   COLONIC POLYPS, HX OF 10/03/2007    Scot Jun, PT, DPT, OCS, ATC 11/22/21  1:40 PM    [facility] [address] [place], [place], [address] Phone: [phone]   Fax:  [phone]  Name: Asier Desroches. MRN: [id number] Date of Birth: 04-26-1951

## 2021-11-22 NOTE — Patient Instructions (Signed)
Continue gabapentin 600mg  four times daily

## 2021-11-23 ENCOUNTER — Encounter: Payer: Self-pay | Admitting: Neurology

## 2021-11-26 ENCOUNTER — Encounter: Payer: Federal, State, Local not specified - PPO | Admitting: Physical Therapy

## 2021-11-26 ENCOUNTER — Telehealth: Payer: Self-pay | Admitting: Physical Therapy

## 2021-11-26 NOTE — Telephone Encounter (Signed)
I called pt to f/u after his missed PT visit today. Pt stated he was having some diarrhea and didn't feel up to coming to therapy. Pt was reminded of his next appointment on 12/04/2021 at 1:45 pm.  Kearney Hard, PT, MPT 11/26/21 5:11 PM

## 2021-12-04 ENCOUNTER — Ambulatory Visit: Payer: Federal, State, Local not specified - PPO | Admitting: Rehabilitative and Restorative Service Providers"

## 2021-12-04 ENCOUNTER — Ambulatory Visit: Payer: Federal, State, Local not specified - PPO | Admitting: Orthopaedic Surgery

## 2021-12-04 ENCOUNTER — Encounter: Payer: Self-pay | Admitting: Orthopaedic Surgery

## 2021-12-04 ENCOUNTER — Encounter: Payer: Self-pay | Admitting: Rehabilitative and Restorative Service Providers"

## 2021-12-04 ENCOUNTER — Other Ambulatory Visit: Payer: Self-pay

## 2021-12-04 VITALS — BP 109/71 | HR 82 | Ht 66.0 in | Wt 145.2 lb

## 2021-12-04 DIAGNOSIS — M6281 Muscle weakness (generalized): Secondary | ICD-10-CM | POA: Diagnosis not present

## 2021-12-04 DIAGNOSIS — G629 Polyneuropathy, unspecified: Secondary | ICD-10-CM | POA: Diagnosis not present

## 2021-12-04 DIAGNOSIS — M5416 Radiculopathy, lumbar region: Secondary | ICD-10-CM

## 2021-12-04 DIAGNOSIS — R2681 Unsteadiness on feet: Secondary | ICD-10-CM | POA: Diagnosis not present

## 2021-12-04 NOTE — Patient Instructions (Signed)
Access Code: [email or web address] URL: [email or web address] Date: 12/04/2021 Prepared by: Scot Jun  Exercises Sit to Stand with Armchair - 1 x daily - 7 x weekly - 3 sets - 10 reps Standing Romberg to 3/4 Tandem Stance - 2 x daily - 7 x weekly - 1 sets - 5-10 reps - max hold Seated Heel Raise - 2 x daily - 7 x weekly - 3 sets - 10 reps Seated Heel Toe Raises - 2 x daily - 7 x weekly - 3 sets - 10 reps Romberg Stance with Head Rotation - 2 x daily - 7 x weekly - 3 sets - 10 reps Seated Long Arc Quad - 2 x daily - 7 x weekly - 10 reps - 3 sets - 2 hold

## 2021-12-04 NOTE — Therapy (Signed)
[facility] Physical Therapy [address] [place], [place], [address] Phone: [phone]   Fax:  [phone]  Physical Therapy Treatment  Patient Details  Name: Darrell Hoffman. MRN: [id number] Date of Birth: 21-Jun-1951 Referring Provider (PT): Marcene Duos MD   Encounter Date: 12/04/2021   PT End of Session - 12/04/21 1344     Visit Number 4    Number of Visits 16    Date for PT Re-Evaluation 01/10/22    Authorization Type BCBS    PT Start Time 1344    PT Stop Time 1657    PT Time Calculation (min) 41 min    Activity Tolerance Patient tolerated treatment well    Behavior During Therapy [facility] for tasks assessed/performed             Past Medical History:  Diagnosis Date   ALCOHOL ABUSE, HX OF 10/03/2007   ALLERGIC RHINITIS 10/03/2007   ANEMIA-B12 DEFICIENCY 12/04/2009   ANEMIA-IRON DEFICIENCY 11/27/2009   ANEMIA-NOS 10/08/2007   ASTHMA 10/03/2007   Asthma    B12 DEFICIENCY 10/06/2008   CKD (chronic kidney disease) stage 3, GFR 30-59 ml/min (HCC) 03/31/2018   COLONIC POLYPS, HX OF 10/03/2007   ERECTILE DYSFUNCTION 10/03/2007   GERD 10/03/2007   History of hiatal hernia    found on CXT in June 2021   [place] 10/03/2007   HYPERTENSION 10/03/2007   KNEE PAIN, RIGHT 10/08/2007   Morbid obesity ([place]) 10/03/2007   Neuromuscular disorder ([place])    per pt. never diagnosed   Substance abuse ([place])    marijuana daily    Past Surgical History:  Procedure Laterality Date   bilateral hydrocele repair     COLONOSCOPY  06/06/2016   POLYPECTOMY  01-03-2010   s/p left knee arthroscopy  2009   s/p TKR  03/2009   VASECTOMY     XI ROBOTIC ASSISTED HIATAL HERNIA REPAIR N/A 01/04/2021   Procedure: XI ROBOTIC ASSISTED HIATAL HERNIA REPAIR AND FUNDOPLICATION WITH MESH;  Surgeon: Ralene Ok, MD;  Location: [facility];  Service: General;  Laterality: N/A;    There were no vitals filed for this visit.   Subjective Assessment - 12/04/21 1425      Subjective Pt. indicated no pain.  Pt. stated stumbling several times but not falling.    Pertinent History multiple falls, Rt TKR, LT knee scope, Bil neuropathy    How long can you walk comfortably? not sure    Patient Stated Goals not use a cane. get strength back    Currently in Pain? No/denies    Pain Onset More than a month ago                [facility] PT Assessment - 12/04/21 0001       Assessment   Medical Diagnosis LBP    Referring Provider (PT) Marcene Duos MD    Onset Date/Surgical Date 02/13/21    Hand Dominance Right      Strength   Right Ankle Plantar Flexion 2+/5    Left Ankle Plantar Flexion 2+/5      Ambulation/Gait   Gait Comments Independent ambulation                           [facility] Adult PT Treatment/Exercise - 12/04/21 0001       Neuro Re-ed    Neuro Re-ed Details  see balance flow sheet      Exercises   Exercises Other Exercises  Other Exercises  HEP review, cues for progression on handout provided      Knee/Hip Exercises: Aerobic   Nustep Lvl 6 UE/LE 10 mins      Knee/Hip Exercises: Standing   Forward Step Up 10 reps;Both;Step Height: 4";Hand Hold: 1   SBA     Knee/Hip Exercises: Seated   Other Seated Knee/Hip Exercises seated heel/toe raise 20x each bilateral                 Balance Exercises - 12/04/21 0001       Balance Exercises: Standing   Standing Eyes Opened Other (comment);Narrow base of support (BOS)   1 min static, 1 min c Lt and Rt head turns, 1 min c up/down head movement   Standing Eyes Closed 30 secs;2 reps;Narrow base of support (BOS)    Tandem Stance Eyes open;Intermittent upper extremity support;Other (comment)   modified (foot step forward but not in line), 1 min x 1 bilateral   Other Standing Exercises alternating toe tapping 4 inch step x 10 bilateral (SBA to min A at times)                PT Education - 12/04/21 1414     Education Details HEP cues and progression as documented.     Person(s) Educated Patient    Methods Explanation;Demonstration;Verbal cues;Handout    Comprehension Returned demonstration;Verbalized understanding              PT Short Term Goals - 12/04/21 1416       PT SHORT TERM GOAL #1   Title ind with initial HEP for strength and balance    Time 3    Period Weeks    Status Achieved    Target Date 12/06/21      PT SHORT TERM GOAL #2   Title Pt able to perform sit to stand without UE assist    Time 4    Period Weeks    Status On-going    Target Date 12/13/21               PT Long Term Goals - 11/15/21 1604       PT LONG TERM GOAL #1   Title improved BERG to >= 50/56 to decrease fall risk.    Time 8    Period Weeks    Status New    Target Date 01/10/22      PT LONG TERM GOAL #2   Title improved 5x sit to stand to <= 11.4 sec to decrease fall risk    Time 8    Period Weeks    Status New      PT LONG TERM GOAL #3   Title improved BLE strength to >= 4+/5 to help normalize functional mobility and balance    Time 8    Period Weeks    Status New      PT LONG TERM GOAL #4   Title Pt able to safely ambulate 500 ft indoor/outdoor surfaces with least restrictive AD    Time 8    Period Weeks    Status New      PT LONG TERM GOAL #5   Title improved FOTO  to 63    Baseline 44    Time 8    Period Weeks    Status New                   Plan - 12/04/21 1416     Clinical Impression Statement  Continued to focus on improved strength and movement coordination intervention to promote improved ambulation and progressive mobility control.  Fair performance overall on static balance intervention performed within clinic today.  Printed off HEP c progressions for home use.    Personal Factors and Comorbidities Comorbidity 3+    Comorbidities multiple falls, Rt TKR, LT knee scope, Bil neuropathy    Examination-Activity Limitations Stairs;Locomotion Level    Stability/Clinical Decision Making Evolving/Moderate complexity     Rehab Potential Good    PT Frequency 2x / week    PT Duration 8 weeks    PT Treatment/Interventions ADLs/Self Care Home Management;Aquatic Therapy;Cryotherapy;Moist Heat;Neuromuscular re-education;Balance training;Therapeutic exercise;Therapeutic activities;Functional mobility training;Stair training;Gait training;Patient/family education;Manual techniques;Dry needling    PT Next Visit Plan DF/PF strengthening to improve ankle strategy in balance, hip strengthening fo hip balance control.    PT Home Exercise Plan [phone]    Consulted and Agree with Plan of Care Patient             Patient will benefit from skilled therapeutic intervention in order to improve the following deficits and impairments:  Abnormal gait, Difficulty walking, Decreased activity tolerance, Pain, Decreased balance, Impaired flexibility, Impaired sensation, Decreased strength  Visit Diagnosis: Unsteadiness on feet  Radiculopathy, lumbar region  Muscle weakness (generalized)     Problem List Patient Active Problem List   Diagnosis Date Noted   Neuropathy 05/29/2021   Malnutrition of moderate degree 01/05/2021   Gastric outlet obstruction 01/01/2021   Hiatal hernia with obstruction but no gangrene 01/01/2021   Preoperative cardiovascular examination 11/21/2020   Bilateral leg pain 08/23/2020   Dysphagia 08/09/2020   Tetrahydrocannabinol (THC) use disorder, moderate, dependence ([place]) 08/09/2020   Weight loss 06/07/2020   Vitamin D deficiency 06/07/2020   Cough 02/10/2019   Subacromial bursitis of left shoulder joint 01/19/2019   CKD (chronic kidney disease) stage 3, GFR 30-59 ml/min (HCC) 03/31/2018   Hyperglycemia 09/25/2017   Daytime somnolence 09/25/2017   Finger pain, right 03/27/2017   History of colonic polyps 03/27/2016   Pre-ulcerative corn or callous 03/27/2016   Skin nodule 01/10/2016   Venereal disease, unspecified 03/23/2012   Renal insufficiency 04/02/2011   Right elbow pain  04/02/2011   Encounter for well adult exam with abnormal findings 03/30/2011   ANEMIA-B12 DEFICIENCY 12/04/2009   ANEMIA-IRON DEFICIENCY 11/27/2009   B12 deficiency 10/06/2008   KNEE PAIN, RIGHT 10/08/2007   Hyperlipidemia 10/03/2007   Morbid obesity ([place]) 10/03/2007   ERECTILE DYSFUNCTION 10/03/2007   Essential hypertension 10/03/2007   ALLERGIC RHINITIS 10/03/2007   Asthma 10/03/2007   GERD 10/03/2007   ALCOHOL ABUSE, HX OF 10/03/2007   COLONIC POLYPS, HX OF 10/03/2007    Scot Jun, PT, DPT, OCS, ATC 12/04/21  2:26 PM    [facility] [address] [place], [place], [address] Phone: [phone]   Fax:  [phone]  Name: Darrell Hoffman. MRN: [id number] Date of Birth: [birth year]

## 2021-12-05 ENCOUNTER — Telehealth: Payer: Self-pay | Admitting: Rehabilitative and Restorative Service Providers"

## 2021-12-05 ENCOUNTER — Encounter: Payer: Federal, State, Local not specified - PPO | Admitting: Rehabilitative and Restorative Service Providers"

## 2021-12-05 NOTE — Telephone Encounter (Signed)
Called about appointment that was listed on schedule for 3:15pm today.  Pt. Indicated he was not aware of scheduled time.  Reminded him of next appointment time.   Scot Jun, PT, DPT, OCS, ATC 12/05/21  3:34 PM

## 2021-12-05 NOTE — Progress Notes (Signed)
Office Visit Note   Patient: Darrell Hoffman.           Date of Birth: [birth year]           MRN: [id number] Visit Date: 12/04/2021              Requested by: [address], [place],  [place] [address] PCP: Biagio Borg, MD   Assessment & Plan: Visit Diagnoses:  1. Neuropathy     Plan: Reviewed MRI report and images with patient.  At this point no surgery is indicated.  No areas of severe compression.  He can follow-up in 4 months.  Follow-Up Instructions: Return in about 4 months (around 04/04/2022).   Orders:  No orders of the defined types were placed in this encounter.  No orders of the defined types were placed in this encounter.     Procedures: No procedures performed   Clinical Data: No additional findings.   Subjective: Chief Complaint  Patient presents with   right lower extremity weakness    HPI 70 year old male seen with some weakness lower extremities and numbness.  He has been followed by Dr. Tomi Likens with a diagnosis of idiopathic polyneuropathy and lumbosacral radiculopathy.  Electrical tests have been performed and reviewed today done on 03/13/2021.  He has had  rheumatologic lab work-up.  MRI scan listed below done 08/03/2021 showed some mild narrowing upper lumbar levels.  Moderate severe left and moderate right neuroforaminal narrowing at L4-5.  Some moderate narrowing at L5-S1 right and left.  No areas of severe compression.  Patient has problems with weight loss but is regained some weight.  Past history of alcohol abuse.  History of stage III kidney disease.  Patient's had A1c is less than 6.5.  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  Review of Systems all other systems noncontributory to HPI.   Objective: Vital Signs: BP 109/71    Pulse 82    Ht 5\' 6"  (1.676 m)    Wt 145 lb 3.2 oz (65.9 kg)    BMI 23.44 kg/m   Physical Exam Constitutional:      Appearance: He is well-developed.  HENT:     Head:  Normocephalic and atraumatic.     Right Ear: External ear normal.     Left Ear: External ear normal.  Eyes:     Pupils: Pupils are equal, round, and reactive to light.  Neck:     Thyroid: No thyromegaly.     Trachea: No tracheal deviation.  Cardiovascular:     Rate and Rhythm: Normal rate.  Pulmonary:     Effort: Pulmonary effort is normal.     Breath sounds: No wheezing.  Abdominal:     General: Bowel sounds are normal.     Palpations: Abdomen is soft.  Musculoskeletal:     Cervical back: Neck supple.  Skin:    General: Skin is warm and dry.     Capillary Refill: Capillary refill takes less than 2 seconds.  Neurological:     Mental Status: He is alert and oriented to person, place, and time.  Psychiatric:        Behavior: Behavior normal.        Thought Content: Thought content normal.        Judgment: Judgment normal.    Ortho Exam patient is ambulatory without antalgic gait.  Negative straight leg raising 90 degrees negative popliteal compression test.  No discomfort internal or external rotation  of his hips.  Anterior tib gastrocsoleus is intact.  Specialty Comments:  No specialty comments available.  Imaging: Narrative & Impression  CLINICAL DATA:  Lumbar radiculopathy, osteoporosis presence or risk   EXAM: MRI LUMBAR SPINE WITHOUT CONTRAST   TECHNIQUE: Multiplanar, multisequence MR imaging of the lumbar spine was performed. No intravenous contrast was administered.   COMPARISON:  Lumbar spine radiograph 07/19/2021, CT abdomen pelvis 06/22/2020   FINDINGS: Segmentation:  Standard.   Alignment:  Physiologic.   Vertebrae: No fracture, evidence of discitis, or aggressive bone lesion.   Conus medullaris and cauda equina: Conus extends to the L1 level. Conus and cauda equina appear normal.   Paraspinal and other soft tissues: There is a large left renal cyst measuring up to 3.1 cm, unchanged from prior CT in July 2021   Disc levels:   T12-L1: No  significant spinal canal or neural foraminal narrowing.   L1-L2: Severe disc height loss a broad-based disc bulging, and bilateral facet arthropathy results in mild bilateral neural foraminal narrowing.   L2-L3: Minimal broad-based disc bulging, ligamentum flavum hypertrophy, results in mild spinal canal and moderate right neural foraminal narrowing.   L3-L4: Bilateral foraminal disc bulging, ligamentum flavum hypertrophy, and bilateral facet arthropathy results in mild spinal canal and bilateral neural foraminal narrowing.   L4-L5: Broad-based disc bulging, ligamentum flavum hypertrophy, and bilateral facet arthropathy results in moderate-severe left and moderate right neural foraminal narrowing. Mild spinal canal narrowing.   L5-S1: Disc height loss with broad-based disc bulging, ligamentum flavum hypertrophy, bilateral facet arthropathy results in moderate bilateral neural foraminal narrowing.   IMPRESSION: Multilevel degenerative changes of the lumbar spine, summarized below:   L1-L2: Mild bilateral neural foraminal narrowing.   L2-L3: Moderate right neural foraminal narrowing at L2-L3. Mild spinal canal narrowing.   L3-L4: Mild spinal canal and bilateral neural foraminal narrowing.   L4-L5: Moderate-severe left and moderate right neural foraminal narrowing. Mild spinal canal narrowing.   L5-S1: Moderate bilateral neural foraminal narrowing.     Electronically Signed   By: Maurine Simmering M.D.   On: 08/03/2021 15:02     PMFS History: Patient Active Problem List   Diagnosis Date Noted   Neuropathy 05/29/2021   Malnutrition of moderate degree 01/05/2021   Gastric outlet obstruction 01/01/2021   Hiatal hernia with obstruction but no gangrene 01/01/2021   Preoperative cardiovascular examination 11/21/2020   Bilateral leg pain 08/23/2020   Dysphagia 08/09/2020   Tetrahydrocannabinol (THC) use disorder, moderate, dependence ([place]) 08/09/2020   Weight loss 06/07/2020    Vitamin D deficiency 06/07/2020   Cough 02/10/2019   Subacromial bursitis of left shoulder joint 01/19/2019   CKD (chronic kidney disease) stage 3, GFR 30-59 ml/min (HCC) 03/31/2018   Hyperglycemia 09/25/2017   Daytime somnolence 09/25/2017   Finger pain, right 03/27/2017   History of colonic polyps 03/27/2016   Pre-ulcerative corn or callous 03/27/2016   Skin nodule 01/10/2016   Venereal disease, unspecified 03/23/2012   Renal insufficiency 04/02/2011   Right elbow pain 04/02/2011   Encounter for well adult exam with abnormal findings 03/30/2011   ANEMIA-B12 DEFICIENCY 12/04/2009   ANEMIA-IRON DEFICIENCY 11/27/2009   B12 deficiency 10/06/2008   KNEE PAIN, RIGHT 10/08/2007   Hyperlipidemia 10/03/2007   Morbid obesity ([place]) 10/03/2007   ERECTILE DYSFUNCTION 10/03/2007   Essential hypertension 10/03/2007   ALLERGIC RHINITIS 10/03/2007   Asthma 10/03/2007   GERD 10/03/2007   ALCOHOL ABUSE, HX OF 10/03/2007   COLONIC POLYPS, HX OF 10/03/2007   Past Medical History:  Diagnosis  Date   ALCOHOL ABUSE, HX OF 10/03/2007   ALLERGIC RHINITIS 10/03/2007   ANEMIA-B12 DEFICIENCY 12/04/2009   ANEMIA-IRON DEFICIENCY 11/27/2009   ANEMIA-NOS 10/08/2007   ASTHMA 10/03/2007   Asthma    B12 DEFICIENCY 10/06/2008   CKD (chronic kidney disease) stage 3, GFR 30-59 ml/min (HCC) 03/31/2018   COLONIC POLYPS, HX OF 10/03/2007   ERECTILE DYSFUNCTION 10/03/2007   GERD 10/03/2007   History of hiatal hernia    found on CXT in June 2021   HYPERLIPIDEMIA 10/03/2007   HYPERTENSION 10/03/2007   KNEE PAIN, RIGHT 10/08/2007   Morbid obesity ([place]) 10/03/2007   Neuromuscular disorder ([place])    per pt. never diagnosed   Substance abuse ([place])    marijuana daily    Family History  Problem Relation Age of Onset   Breast cancer Mother    Lupus Sister    Hypertension Sister    Diabetes Sister    Alcohol abuse Paternal Uncle    Coronary artery disease Other        male 1st degree relative - MI at 69  yo   Diabetes Cousin        2 cousins   Colon cancer Neg Hx    Esophageal cancer Neg Hx    Rectal cancer Neg Hx    Stomach cancer Neg Hx    Colon polyps Neg Hx     Past Surgical History:  Procedure Laterality Date   bilateral hydrocele repair     COLONOSCOPY  06/06/2016   POLYPECTOMY  01-03-2010   s/p left knee arthroscopy  2009   s/p TKR  03/2009   VASECTOMY     XI ROBOTIC ASSISTED HIATAL HERNIA REPAIR N/A 01/04/2021   Procedure: XI ROBOTIC ASSISTED HIATAL HERNIA REPAIR AND FUNDOPLICATION WITH MESH;  Surgeon: Ralene Ok, MD;  Location: [facility];  Service: General;  Laterality: N/A;   Social History   Occupational History   Occupation: Research officer, political party: [employer]  Tobacco Use   Smoking status: Some Days   Smokeless tobacco: Never  Vaping Use   Vaping Use: Never used  Substance and Sexual Activity   Alcohol use: Not Currently    Alcohol/week: 0.0 standard drinks   Drug use: Yes    Types: Marijuana    Comment: marijuana daily   Sexual activity: Not on file

## 2021-12-11 ENCOUNTER — Encounter: Payer: Self-pay | Admitting: Rehabilitative and Restorative Service Providers"

## 2021-12-11 ENCOUNTER — Ambulatory Visit: Payer: Federal, State, Local not specified - PPO | Admitting: Rehabilitative and Restorative Service Providers"

## 2021-12-11 ENCOUNTER — Other Ambulatory Visit: Payer: Self-pay

## 2021-12-11 DIAGNOSIS — R2681 Unsteadiness on feet: Secondary | ICD-10-CM

## 2021-12-11 DIAGNOSIS — M6281 Muscle weakness (generalized): Secondary | ICD-10-CM

## 2021-12-11 DIAGNOSIS — M5416 Radiculopathy, lumbar region: Secondary | ICD-10-CM

## 2021-12-11 NOTE — Therapy (Signed)
[facility] [address] [place], [place], [address] Phone: [phone]   Fax:  [phone]  Physical Therapy Treatment  Patient Details  Name: Darrell Hoffman. MRN: [id number] Date of Birth: January 31, 1951 Referring Provider (PT): Marcene Duos MD   Encounter Date: 12/11/2021   PT End of Session - 12/11/21 1505     Visit Number 5    Number of Visits 16    Date for PT Re-Evaluation 01/10/22    Authorization Type BCBS    PT Start Time 1506    PT Stop Time 1520    PT Time Calculation (min) 14 min    Activity Tolerance Other (comment)   Limited by stomach, GI, general fatigue possible   Behavior During Therapy WFL for tasks assessed/performed             Past Medical History:  Diagnosis Date   ALCOHOL ABUSE, HX OF 10/03/2007   ALLERGIC RHINITIS 10/03/2007   ANEMIA-B12 DEFICIENCY 12/04/2009   ANEMIA-IRON DEFICIENCY 11/27/2009   ANEMIA-NOS 10/08/2007   ASTHMA 10/03/2007   Asthma    B12 DEFICIENCY 10/06/2008   CKD (chronic kidney disease) stage 3, GFR 30-59 ml/min (HCC) 03/31/2018   COLONIC POLYPS, HX OF 10/03/2007   ERECTILE DYSFUNCTION 10/03/2007   GERD 10/03/2007   History of hiatal hernia    found on CXT in June 2021   [place] 10/03/2007   HYPERTENSION 10/03/2007   KNEE PAIN, RIGHT 10/08/2007   Morbid obesity ([place]) 10/03/2007   Neuromuscular disorder ([place])    per pt. never diagnosed   Substance abuse ([place])    marijuana daily    Past Surgical History:  Procedure Laterality Date   bilateral hydrocele repair     COLONOSCOPY  06/06/2016   POLYPECTOMY  01-03-2010   s/p left knee arthroscopy  2009   s/p TKR  03/2009   VASECTOMY     XI ROBOTIC ASSISTED HIATAL HERNIA REPAIR N/A 01/04/2021   Procedure: XI ROBOTIC ASSISTED HIATAL HERNIA REPAIR AND FUNDOPLICATION WITH MESH;  Surgeon: Ralene Ok, MD;  Location: [facility];  Service: General;  Laterality: N/A;    There were no vitals filed for this visit.   Subjective  Assessment - 12/11/21 1512     Subjective Pt. indicated he felt general aches and pains throughout body in last day or so. Pt. indicated feeling more trouble from numbness today as well as stomach/GI complaints, reporting he wasn't sure what he might be able to do today.    Pertinent History multiple falls, Rt TKR, LT knee scope, Bil neuropathy    How long can you walk comfortably? not sure    Patient Stated Goals not use a cane. get strength back    Currently in Pain? No/denies    Pain Onset More than a month ago                [facility] PT Assessment - 12/11/21 0001       Assessment   Medical Diagnosis LBP    Referring Provider (PT) Marcene Duos MD    Onset Date/Surgical Date 02/13/21    Hand Dominance Right                           [facility] Adult PT Treatment/Exercise - 12/11/21 0001       Knee/Hip Exercises: Machines for Strengthening   Total Gym Leg Press Double leg 37 lbs 2 x 10      Knee/Hip Exercises: Standing  Forward Step Up Step Height: 4";10 reps;1 set;Both;Hand Hold: 1                       PT Short Term Goals - 12/04/21 1416       PT SHORT TERM GOAL #1   Title ind with initial HEP for strength and balance    Time 3    Period Weeks    Status Achieved    Target Date 12/06/21      PT SHORT TERM GOAL #2   Title Pt able to perform sit to stand without UE assist    Time 4    Period Weeks    Status On-going    Target Date 12/13/21               PT Long Term Goals - 11/15/21 1604       PT LONG TERM GOAL #1   Title improved BERG to >= 50/56 to decrease fall risk.    Time 8    Period Weeks    Status New    Target Date 01/10/22      PT LONG TERM GOAL #2   Title improved 5x sit to stand to <= 11.4 sec to decrease fall risk    Time 8    Period Weeks    Status New      PT LONG TERM GOAL #3   Title improved BLE strength to >= 4+/5 to help normalize functional mobility and balance    Time 8    Period Weeks     Status New      PT LONG TERM GOAL #4   Title Pt able to safely ambulate 500 ft indoor/outdoor surfaces with least restrictive AD    Time 8    Period Weeks    Status New      PT LONG TERM GOAL #5   Title improved FOTO  to 63    Baseline 44    Time 8    Period Weeks    Status New                   Plan - 12/11/21 1521     Clinical Impression Statement Pt. requested cessation of visit today due to general complaints as noted in subjective as well as GI related complaints.  In performance today, LE weakness noted in functional step activity bilaterally, requiring UE assist in movement.  Improved strength would improve functional movement pattern.    Personal Factors and Comorbidities Comorbidity 3+    Comorbidities multiple falls, Rt TKR, LT knee scope, Bil neuropathy    Examination-Activity Limitations Stairs;Locomotion Level    Stability/Clinical Decision Making Evolving/Moderate complexity    Rehab Potential Good    PT Frequency 2x / week    PT Duration 8 weeks    PT Treatment/Interventions ADLs/Self Care Home Management;Aquatic Therapy;Cryotherapy;Moist Heat;Neuromuscular re-education;Balance training;Therapeutic exercise;Therapeutic activities;Functional mobility training;Stair training;Gait training;Patient/family education;Manual techniques;Dry needling    PT Next Visit Plan Resume strengthening and balance control as able.    PT Home Exercise Plan [phone]    Consulted and Agree with Plan of Care Patient             Patient will benefit from skilled therapeutic intervention in order to improve the following deficits and impairments:  Abnormal gait, Difficulty walking, Decreased activity tolerance, Pain, Decreased balance, Impaired flexibility, Impaired sensation, Decreased strength  Visit Diagnosis: Unsteadiness on feet  Radiculopathy, lumbar region  Muscle weakness (generalized)  Problem List Patient Active Problem List   Diagnosis Date Noted    Neuropathy 05/29/2021   Malnutrition of moderate degree 01/05/2021   Gastric outlet obstruction 01/01/2021   Hiatal hernia with obstruction but no gangrene 01/01/2021   Preoperative cardiovascular examination 11/21/2020   Bilateral leg pain 08/23/2020   Dysphagia 08/09/2020   Tetrahydrocannabinol (THC) use disorder, moderate, dependence ([place]) 08/09/2020   Weight loss 06/07/2020   Vitamin D deficiency 06/07/2020   Cough 02/10/2019   Subacromial bursitis of left shoulder joint 01/19/2019   CKD (chronic kidney disease) stage 3, GFR 30-59 ml/min (HCC) 03/31/2018   Hyperglycemia 09/25/2017   Daytime somnolence 09/25/2017   Finger pain, right 03/27/2017   History of colonic polyps 03/27/2016   Pre-ulcerative corn or callous 03/27/2016   Skin nodule 01/10/2016   Venereal disease, unspecified 03/23/2012   Renal insufficiency 04/02/2011   Right elbow pain 04/02/2011   Encounter for well adult exam with abnormal findings 03/30/2011   ANEMIA-B12 DEFICIENCY 12/04/2009   ANEMIA-IRON DEFICIENCY 11/27/2009   B12 deficiency 10/06/2008   KNEE PAIN, RIGHT 10/08/2007   Hyperlipidemia 10/03/2007   Morbid obesity ([place]) 10/03/2007   ERECTILE DYSFUNCTION 10/03/2007   Essential hypertension 10/03/2007   ALLERGIC RHINITIS 10/03/2007   Asthma 10/03/2007   GERD 10/03/2007   ALCOHOL ABUSE, HX OF 10/03/2007   COLONIC POLYPS, HX OF 10/03/2007    Scot Jun, PT, DPT, OCS, ATC 12/11/21  3:24 PM    [facility] [address] [place], [place], [address] Phone: [phone]   Fax:  [phone]  Name: Darrell Hoffman. MRN: [id number] Date of Birth: 1951/07/27

## 2021-12-13 ENCOUNTER — Other Ambulatory Visit: Payer: Self-pay

## 2021-12-13 ENCOUNTER — Ambulatory Visit: Payer: Federal, State, Local not specified - PPO | Admitting: Rehabilitative and Restorative Service Providers"

## 2021-12-13 ENCOUNTER — Encounter: Payer: Self-pay | Admitting: Rehabilitative and Restorative Service Providers"

## 2021-12-13 DIAGNOSIS — M6281 Muscle weakness (generalized): Secondary | ICD-10-CM

## 2021-12-13 DIAGNOSIS — R2681 Unsteadiness on feet: Secondary | ICD-10-CM | POA: Diagnosis not present

## 2021-12-13 DIAGNOSIS — M5416 Radiculopathy, lumbar region: Secondary | ICD-10-CM

## 2021-12-13 NOTE — Therapy (Addendum)
[facility] [address] [place], [place], [address] Phone: [phone]   Fax:  [phone]  Physical Therapy Treatment /Discharge  Patient Details  Name: Darrell Hoffman. MRN: [id number] Date of Birth: 11-28-1951 Referring Provider (PT): Marcene Duos MD   Encounter Date: 12/13/2021   PT End of Session - 12/13/21 1534     Visit Number 6    Number of Visits 16    Date for PT Re-Evaluation 01/10/22    Authorization Type BCBS    PT Start Time 7416    PT Stop Time 1550    PT Time Calculation (min) 27 min    Activity Tolerance Patient tolerated treatment well    Behavior During Therapy [facility] for tasks assessed/performed             Past Medical History:  Diagnosis Date   ALCOHOL ABUSE, HX OF 10/03/2007   ALLERGIC RHINITIS 10/03/2007   ANEMIA-B12 DEFICIENCY 12/04/2009   ANEMIA-IRON DEFICIENCY 11/27/2009   ANEMIA-NOS 10/08/2007   ASTHMA 10/03/2007   Asthma    B12 DEFICIENCY 10/06/2008   CKD (chronic kidney disease) stage 3, GFR 30-59 ml/min ([place]) 03/31/2018   COLONIC POLYPS, HX OF 10/03/2007   ERECTILE DYSFUNCTION 10/03/2007   GERD 10/03/2007   History of hiatal hernia    found on CXT in June 2021   [place] 10/03/2007   HYPERTENSION 10/03/2007   KNEE PAIN, RIGHT 10/08/2007   Morbid obesity ([place]) 10/03/2007   Neuromuscular disorder ([place])    per pt. never diagnosed   Substance abuse ([place])    marijuana daily    Past Surgical History:  Procedure Laterality Date   bilateral hydrocele repair     COLONOSCOPY  06/06/2016   POLYPECTOMY  01-03-2010   s/p left knee arthroscopy  2009   s/p TKR  03/2009   VASECTOMY     XI ROBOTIC ASSISTED HIATAL HERNIA REPAIR N/A 01/04/2021   Procedure: XI ROBOTIC ASSISTED HIATAL HERNIA REPAIR AND FUNDOPLICATION WITH MESH;  Surgeon: Ralene Ok, MD;  Location: [facility];  Service: General;  Laterality: N/A;    There were no vitals filed for this visit.       [facility] PT Assessment - 12/13/21  0001       Assessment   Medical Diagnosis LBP    Referring Provider (PT) Marcene Duos MD    Onset Date/Surgical Date 02/13/21    Hand Dominance Right      Observation/Other Assessments   Focus on Therapeutic Outcomes (FOTO)  update 45% (predicted 15)      Standardized Balance Assessment   Five times sit to stand comments  19 seconds : bilateral UE assist required from 18 inch chair      Berg Balance Test   Sit to Stand Able to stand  independently using hands    Standing Unsupported Able to stand safely 2 minutes    Sitting with Back Unsupported but Feet Supported on Floor or Stool Able to sit safely and securely 2 minutes    Stand to Sit Controls descent by using hands    Transfers Able to transfer safely, definite need of hands    Standing Unsupported with Eyes Closed Able to stand 10 seconds safely    Standing Unsupported with Feet Together Able to place feet together independently and stand 1 minute safely    From Standing, Reach Forward with Outstretched Arm Can reach forward >5 cm safely (2")    From Standing Position, Pick up Object from [place] to pick  up shoe, needs supervision    From Standing Position, Turn to Look Behind Over each Shoulder Looks behind from both sides and weight shifts well    Turn 360 Degrees Able to turn 360 degrees safely in 4 seconds or less    Standing Unsupported, Alternately Place Feet on Step/Stool Able to complete >2 steps/needs minimal assist    Standing Unsupported, One Foot in [place] to take small step independently and hold 30 seconds    Standing on One Leg Tries to lift leg/unable to hold 3 seconds but remains standing independently    Total Score 42                           [facility] Adult PT Treatment/Exercise - 12/13/21 0001       Exercises   Other Exercises  HEP review c verbal and visual handout program.  Extended time today spent on review of existing HEP and education on importance of use to help gain  improvements to promote reduced fall risk and improve strength/functional mobility.  Cues required for techniques of intervention.      Knee/Hip Exercises: Seated   Sit to Sand without UE support;5 reps                       PT Short Term Goals - 12/13/21 1553       PT SHORT TERM GOAL #1   Title ind with initial HEP for strength and balance    Baseline 12/13/2021: pt. indicated not performing most of the exercises in last weeks    Time 3    Period Weeks    Status Partially Met    Target Date 12/06/21      PT SHORT TERM GOAL #2   Title Pt able to perform sit to stand without UE assist    Time 4    Period Weeks    Status Not Met    Target Date 12/13/21               PT Long Term Goals - 12/13/21 1554       PT LONG TERM GOAL #1   Title improved BERG to >= 50/56 to decrease fall risk.    Time 8    Period Weeks    Status On-going    Target Date 01/10/22      PT LONG TERM GOAL #2   Title improved 5x sit to stand to <= 11.4 sec to decrease fall risk    Time 8    Period Weeks    Status On-going    Target Date 01/10/22      PT LONG TERM GOAL #3   Title improved BLE strength to >= 4+/5 to help normalize functional mobility and balance    Time 8    Period Weeks    Status On-going    Target Date 01/10/22      PT LONG TERM GOAL #4   Title Pt able to safely ambulate 500 ft indoor/outdoor surfaces with least restrictive AD    Time 8    Period Weeks    Status Achieved    Target Date 01/10/22      PT LONG TERM GOAL #5   Title improved FOTO  to 63    Baseline 44    Time 8    Period Weeks    Status On-going    Target Date 01/10/22  Plan - 12/13/21 1606     Clinical Impression Statement Review of presentation indicated similar overall performance on FOTO reassessment as well as BERG balance testing.  Of note, Pt. had indicated today he was not routine c use of most of the HEP that was previously documented.  Overall  presentation and lack of change on testing could be attributed in part due to the inconsistent use of HEP to help improve clinical presentation.  Extended time today spent on review of existing HEP and education on importance of use to help gain improvements to promote reduced fall risk and improve strength/functional mobility.  Pt. was in agreement in discussion performed today.  Continued skilled PT services warranted.    Personal Factors and Comorbidities Comorbidity 3+    Comorbidities multiple falls, Rt TKR, LT knee scope, Bil neuropathy    Examination-Activity Limitations Stairs;Locomotion Level    Stability/Clinical Decision Making Evolving/Moderate complexity    Rehab Potential Good    PT Frequency 2x / week    PT Duration 8 weeks    PT Treatment/Interventions ADLs/Self Care Home Management;Aquatic Therapy;Cryotherapy;Moist Heat;Neuromuscular re-education;Balance training;Therapeutic exercise;Therapeutic activities;Functional mobility training;Stair training;Gait training;Patient/family education;Manual techniques;Dry needling    PT Next Visit Plan Static/dynamic balance, progress LE strength.    PT Home Exercise Plan [phone]    Consulted and Agree with Plan of Care Patient             Patient will benefit from skilled therapeutic intervention in order to improve the following deficits and impairments:  Abnormal gait, Difficulty walking, Decreased activity tolerance, Pain, Decreased balance, Impaired flexibility, Impaired sensation, Decreased strength  Visit Diagnosis: No diagnosis found.     Problem List Patient Active Problem List   Diagnosis Date Noted   Neuropathy 05/29/2021   Malnutrition of moderate degree 01/05/2021   Gastric outlet obstruction 01/01/2021   Hiatal hernia with obstruction but no gangrene 01/01/2021   Preoperative cardiovascular examination 11/21/2020   Bilateral leg pain 08/23/2020   Dysphagia 08/09/2020   Tetrahydrocannabinol (THC) use disorder,  moderate, dependence ([place]) 08/09/2020   Weight loss 06/07/2020   Vitamin D deficiency 06/07/2020   Cough 02/10/2019   Subacromial bursitis of left shoulder joint 01/19/2019   CKD (chronic kidney disease) stage 3, GFR 30-59 ml/min (HCC) 03/31/2018   Hyperglycemia 09/25/2017   Daytime somnolence 09/25/2017   Finger pain, right 03/27/2017   History of colonic polyps 03/27/2016   Pre-ulcerative corn or callous 03/27/2016   Skin nodule 01/10/2016   Venereal disease, unspecified 03/23/2012   Renal insufficiency 04/02/2011   Right elbow pain 04/02/2011   Encounter for well adult exam with abnormal findings 03/30/2011   ANEMIA-B12 DEFICIENCY 12/04/2009   ANEMIA-IRON DEFICIENCY 11/27/2009   B12 deficiency 10/06/2008   KNEE PAIN, RIGHT 10/08/2007   Hyperlipidemia 10/03/2007   Morbid obesity ([place]) 10/03/2007   ERECTILE DYSFUNCTION 10/03/2007   Essential hypertension 10/03/2007   ALLERGIC RHINITIS 10/03/2007   Asthma 10/03/2007   GERD 10/03/2007   ALCOHOL ABUSE, HX OF 10/03/2007   COLONIC POLYPS, HX OF 10/03/2007    Scot Jun, PT, DPT, OCS, ATC 12/13/21  4:11 PM  PHYSICAL THERAPY DISCHARGE SUMMARY  Visits from Start of Care: 6  Current functional level related to goals / functional outcomes: See note   Remaining deficits: See note   Education / Equipment: HEP   Patient agrees to discharge. Patient goals were partially met. Patient is being discharged due to not returning since the last visit.  Scot Jun, PT, DPT, OCS, ATC 01/24/22  8:12  [facility] [address] [place], [place], [address] Phone: [phone]   Fax:  [phone]  Name: Carlie Corpus. MRN: [id number] Date of Birth: May 23, 1951

## 2021-12-18 ENCOUNTER — Encounter: Payer: Federal, State, Local not specified - PPO | Admitting: Rehabilitative and Restorative Service Providers"

## 2021-12-20 ENCOUNTER — Encounter: Payer: Federal, State, Local not specified - PPO | Admitting: Rehabilitative and Restorative Service Providers"

## 2022-01-01 ENCOUNTER — Other Ambulatory Visit: Payer: Self-pay

## 2022-01-01 ENCOUNTER — Emergency Department (HOSPITAL_BASED_OUTPATIENT_CLINIC_OR_DEPARTMENT_OTHER): Payer: Federal, State, Local not specified - PPO | Admitting: Radiology

## 2022-01-01 ENCOUNTER — Inpatient Hospital Stay (HOSPITAL_BASED_OUTPATIENT_CLINIC_OR_DEPARTMENT_OTHER)
Admission: EM | Admit: 2022-01-01 | Discharge: 2022-01-04 | DRG: 392 | Disposition: A | Discharge status: Home / Self Care | Payer: Federal, State, Local not specified - PPO | Attending: Internal Medicine | Admitting: Internal Medicine

## 2022-01-01 ENCOUNTER — Encounter: Payer: Federal, State, Local not specified - PPO | Admitting: Physical Therapy

## 2022-01-01 ENCOUNTER — Emergency Department (HOSPITAL_BASED_OUTPATIENT_CLINIC_OR_DEPARTMENT_OTHER): Payer: Federal, State, Local not specified - PPO

## 2022-01-01 ENCOUNTER — Encounter (HOSPITAL_BASED_OUTPATIENT_CLINIC_OR_DEPARTMENT_OTHER): Payer: Self-pay | Admitting: Emergency Medicine

## 2022-01-01 DIAGNOSIS — Z7982 Long term (current) use of aspirin: Secondary | ICD-10-CM

## 2022-01-01 DIAGNOSIS — Z79899 Other long term (current) drug therapy: Secondary | ICD-10-CM

## 2022-01-01 DIAGNOSIS — E876 Hypokalemia: Secondary | ICD-10-CM | POA: Diagnosis present

## 2022-01-01 DIAGNOSIS — R0602 Shortness of breath: Secondary | ICD-10-CM | POA: Diagnosis not present

## 2022-01-01 DIAGNOSIS — K449 Diaphragmatic hernia without obstruction or gangrene: Secondary | ICD-10-CM | POA: Diagnosis not present

## 2022-01-01 DIAGNOSIS — G894 Chronic pain syndrome: Secondary | ICD-10-CM | POA: Diagnosis present

## 2022-01-01 DIAGNOSIS — G629 Polyneuropathy, unspecified: Secondary | ICD-10-CM | POA: Diagnosis present

## 2022-01-01 DIAGNOSIS — F17200 Nicotine dependence, unspecified, uncomplicated: Secondary | ICD-10-CM | POA: Diagnosis present

## 2022-01-01 DIAGNOSIS — N183 Chronic kidney disease, stage 3 unspecified: Secondary | ICD-10-CM | POA: Diagnosis present

## 2022-01-01 DIAGNOSIS — R112 Nausea with vomiting, unspecified: Principal | ICD-10-CM | POA: Diagnosis present

## 2022-01-01 DIAGNOSIS — I700 Atherosclerosis of aorta: Secondary | ICD-10-CM | POA: Diagnosis not present

## 2022-01-01 DIAGNOSIS — E785 Hyperlipidemia, unspecified: Secondary | ICD-10-CM | POA: Diagnosis present

## 2022-01-01 DIAGNOSIS — Z20822 Contact with and (suspected) exposure to covid-19: Secondary | ICD-10-CM | POA: Diagnosis present

## 2022-01-01 DIAGNOSIS — R1111 Vomiting without nausea: Secondary | ICD-10-CM | POA: Diagnosis not present

## 2022-01-01 DIAGNOSIS — K219 Gastro-esophageal reflux disease without esophagitis: Secondary | ICD-10-CM | POA: Diagnosis not present

## 2022-01-01 DIAGNOSIS — R000 Tachycardia, unspecified: Secondary | ICD-10-CM | POA: Diagnosis not present

## 2022-01-01 DIAGNOSIS — I5030 Unspecified diastolic (congestive) heart failure: Secondary | ICD-10-CM | POA: Diagnosis not present

## 2022-01-01 DIAGNOSIS — Z8249 Family history of ischemic heart disease and other diseases of the circulatory system: Secondary | ICD-10-CM

## 2022-01-01 DIAGNOSIS — R531 Weakness: Secondary | ICD-10-CM | POA: Diagnosis not present

## 2022-01-01 DIAGNOSIS — I10 Essential (primary) hypertension: Secondary | ICD-10-CM | POA: Diagnosis not present

## 2022-01-01 DIAGNOSIS — I129 Hypertensive chronic kidney disease with stage 1 through stage 4 chronic kidney disease, or unspecified chronic kidney disease: Secondary | ICD-10-CM | POA: Diagnosis not present

## 2022-01-01 DIAGNOSIS — E860 Dehydration: Secondary | ICD-10-CM | POA: Diagnosis not present

## 2022-01-01 DIAGNOSIS — R778 Other specified abnormalities of plasma proteins: Secondary | ICD-10-CM | POA: Diagnosis not present

## 2022-01-01 DIAGNOSIS — F1220 Cannabis dependence, uncomplicated: Secondary | ICD-10-CM | POA: Diagnosis present

## 2022-01-01 LAB — BASIC METABOLIC PANEL
Anion gap: 17 — ABNORMAL HIGH (ref 5–15)
BUN: 28 mg/dL — ABNORMAL HIGH (ref 8–23)
CO2: 23 mmol/L (ref 22–32)
Calcium: 10.8 mg/dL — ABNORMAL HIGH (ref 8.9–10.3)
Chloride: 100 mmol/L (ref 98–111)
Creatinine, Ser: 1.16 mg/dL (ref 0.61–1.24)
GFR, Estimated: >60 mL/min (ref >60)
Glucose, Bld: 126 mg/dL — ABNORMAL HIGH (ref 70–99)
Potassium: 3.9 mmol/L (ref 3.5–5.1)
Sodium: 140 mmol/L (ref 135–145)

## 2022-01-01 LAB — HEPATIC FUNCTION PANEL
ALT: 12 U/L (ref 0–44)
AST: 17 U/L (ref 15–41)
Albumin: 4.7 g/dL (ref 3.5–5.0)
Alkaline Phosphatase: 44 U/L (ref 38–126)
Bilirubin, Direct: 0.2 mg/dL (ref 0.0–0.2)
Indirect Bilirubin: 1 mg/dL — ABNORMAL HIGH (ref 0.3–0.9)
Total Bilirubin: 1.2 mg/dL (ref 0.3–1.2)
Total Protein: 8.1 g/dL (ref 6.5–8.1)

## 2022-01-01 LAB — CBC
HCT: 42.1 % (ref 39.0–52.0)
Hemoglobin: 13.7 g/dL (ref 13.0–17.0)
MCH: 26.4 pg (ref 26.0–34.0)
MCHC: 32.5 g/dL (ref 30.0–36.0)
MCV: 81.3 fL (ref 80.0–100.0)
Platelets: 331 10*3/uL (ref 150–400)
RBC: 5.18 MIL/uL (ref 4.22–5.81)
RDW: 16.7 % — ABNORMAL HIGH (ref 11.5–15.5)
WBC: 12.6 10*3/uL — ABNORMAL HIGH (ref 4.0–10.5)
nRBC: 0 % (ref 0.0–0.2)

## 2022-01-01 LAB — LIPASE, BLOOD: Lipase: <10 U/L — ABNORMAL LOW (ref 11–51)

## 2022-01-01 LAB — PHOSPHORUS: Phosphorus: 3.1 mg/dL (ref 2.5–4.6)

## 2022-01-01 LAB — RESP PANEL BY RT-PCR (FLU A&B, COVID) ARPGX2
Influenza A by PCR: NEGATIVE
Influenza B by PCR: NEGATIVE
SARS Coronavirus 2 by RT PCR: NEGATIVE

## 2022-01-01 LAB — MAGNESIUM: Magnesium: 2.2 mg/dL (ref 1.7–2.4)

## 2022-01-01 LAB — BRAIN NATRIURETIC PEPTIDE: B Natriuretic Peptide: 538.3 pg/mL — ABNORMAL HIGH (ref 0.0–100.0)

## 2022-01-01 LAB — TSH: TSH: 3.383 u[IU]/mL (ref 0.350–4.500)

## 2022-01-01 MED ORDER — DIPHENHYDRAMINE HCL 50 MG/ML IJ SOLN
12.5000 mg | Freq: Once | INTRAMUSCULAR | Status: AC
  Administered 2022-01-01: 12.5 mg via INTRAVENOUS
  Filled 2022-01-01: qty 1

## 2022-01-01 MED ORDER — IOHEXOL 300 MG/ML  SOLN
80.0000 mL | Freq: Once | INTRAMUSCULAR | Status: AC | PRN
  Administered 2022-01-01: 80 mL via INTRAVENOUS

## 2022-01-01 MED ORDER — METOCLOPRAMIDE HCL 5 MG/ML IJ SOLN
10.0000 mg | Freq: Once | INTRAMUSCULAR | Status: AC
  Administered 2022-01-01: 10 mg via INTRAVENOUS
  Filled 2022-01-01: qty 2

## 2022-01-01 MED ORDER — SODIUM CHLORIDE 0.9 % IV BOLUS
1000.0000 mL | Freq: Once | INTRAVENOUS | Status: AC
  Administered 2022-01-01: 1000 mL via INTRAVENOUS

## 2022-01-01 MED ORDER — ONDANSETRON HCL 4 MG/2ML IJ SOLN
4.0000 mg | Freq: Once | INTRAMUSCULAR | Status: AC
  Administered 2022-01-01: 4 mg via INTRAVENOUS
  Filled 2022-01-01: qty 2

## 2022-01-01 NOTE — ED Notes (Signed)
ED Provider at bedside. 

## 2022-01-01 NOTE — ED Provider Notes (Signed)
[facility] EMERGENCY DEPT Provider Note   CSN: [id number] Arrival date & time: 01/01/22  1127     History  Chief Complaint  Patient presents with   Abdominal Pain    Darrell Hoffman. is a 71 y.o. male.  Medical history includes hyperlipidemia, hypertension, CKD stage III, significant recent weight loss, gastric outlet obstruction secondary to hiatal status post surgery.  Presents the emergency department with worsening nausea and vomiting over the past 3 to 4 days.  He has not been able to keep down any p.o. intake.  He does not have any associated abdominal pain.  Has not had a bowel movement during these days.  He is passing gas.  He also endorses some worsening shortness of breath and subjective leg swelling bilaterally.  He denies any chest pain. He does state that he has had a productive cough since Friday. Per patient, he had a hiatal hernia surgery about 1 year ago.  He has had significant 100 pound weight loss over the last year and has had recurrent symptoms similar to these once he has had now, however the symptoms today are much more severe than normal.  He does not follow with a gastroenterologist.  Abdominal Pain Associated symptoms: constipation, cough, fatigue, nausea, shortness of breath and vomiting       Home Medications Prior to Admission medications   Medication Sig Start Date End Date Taking? Authorizing Provider  gabapentin (NEURONTIN) 600 MG tablet Take 1 tablet (600 mg total) by mouth 4 (four) times daily. 08/15/21  Yes Jaffe, Adam R, DO  acetaminophen-codeine (TYLENOL #3) 300-30 MG tablet Take 1 tablet by mouth every 8 (eight) hours as needed for moderate pain. Patient not taking: Reported on 11/15/2021 07/19/21   Magnant, Gerrianne Scale, PA-C  acetaminophen-codeine (TYLENOL #3) 300-30 MG tablet Take 1 tablet by mouth every 4 (four) hours as needed for moderate pain. Patient not taking: Reported on 11/15/2021 07/29/21   Meredith Pel, MD  albuterol  (PROVENTIL HFA) [phone] Base) MCG/ACT inhaler Inhale 2 puffs into the lungs every 4 (four) hours as needed. Patient not taking: Reported on 01/01/2022 11/03/19   Biagio Borg, MD  ASPIRIN LOW DOSE 81 MG EC tablet TAKE 1 TABLET(81 MG) BY MOUTH DAILY. SWALLOW WHOLE Patient not taking: Reported on 11/15/2021 01/17/20   Biagio Borg, MD  CIALIS 20 MG tablet TAKE 1 TABLET BY MOUTH DAILY AS NEEDED 04/23/12   Biagio Borg, MD  fluticasone [facility]) 50 MCG/ACT nasal spray Place 2 sprays into both nostrils daily. 03/17/19   Biagio Borg, MD  omeprazole (PRILOSEC) 40 MG capsule TAKE 1 CAPSULE(40 MG) BY MOUTH IN THE MORNING AND AT BEDTIME Patient not taking: Reported on 08/15/2021 04/16/21   Ladene Artist, MD  predniSONE (STERAPRED UNI-PAK 21 TAB) 10 MG (21) TBPK tablet Take as directed on packaging Patient not taking: Reported on 08/15/2021 07/19/21   Magnant, Gerrianne Scale, PA-C  vitamin B-12 (CYANOCOBALAMIN) 1000 MCG tablet Take 1 tablet (1,000 mcg total) by mouth daily. Patient not taking: Reported on 01/01/2022 06/07/20   Biagio Borg, MD  Grant Ruts INHUB 500-50 MCG/DOSE AEPB INHALE 1 PUFF INTO THE LUNGS TWICE DAILY Patient not taking: Reported on 08/15/2021 11/13/20   Biagio Borg, MD      Allergies    Patient has no known allergies.    Review of Systems   Review of Systems  Constitutional:  Positive for fatigue.  Respiratory:  Positive for cough and shortness of  breath.   Gastrointestinal:  Positive for abdominal pain, constipation, nausea and vomiting.  All other systems reviewed and are negative.  Physical Exam Updated Vital Signs BP (!) 157/111    Pulse (!) 104    Temp 97.8 F (36.6 C)    Resp 16    Ht 5\' 6"  (1.676 m)    Wt 61.2 kg    SpO2 99%    BMI 21.79 kg/m  Physical Exam Vitals and nursing note reviewed.  Constitutional:      General: He is not in acute distress.    Appearance: Normal appearance. He is ill-appearing. He is not toxic-appearing or diaphoretic.     Comments: appears  malnourished and chronically ill-appearing  HENT:     Head: Normocephalic and atraumatic.     Nose: No nasal deformity.     Mouth/Throat:     Lips: Pink. No lesions.     Mouth: Mucous membranes are dry. No injury, lacerations, oral lesions or angioedema.     Pharynx: Oropharynx is clear. Uvula midline. No pharyngeal swelling, oropharyngeal exudate, posterior oropharyngeal erythema or uvula swelling.  Eyes:     General: Gaze aligned appropriately. No scleral icterus.       Right eye: No discharge.        Left eye: No discharge.     Conjunctiva/sclera: Conjunctivae normal.     Right eye: Right conjunctiva is not injected. No exudate or hemorrhage.    Left eye: Left conjunctiva is not injected. No exudate or hemorrhage. Cardiovascular:     Rate and Rhythm: Normal rate and regular rhythm.     Pulses: Normal pulses.          Radial pulses are 2+ on the right side and 2+ on the left side.       Dorsalis pedis pulses are 2+ on the right side and 2+ on the left side.     Heart sounds: Normal heart sounds, S1 normal and S2 normal. Heart sounds not distant. No murmur heard.   No friction rub. No gallop. No S3 or S4 sounds.  Pulmonary:     Effort: Pulmonary effort is normal. No accessory muscle usage or respiratory distress.     Breath sounds: Normal breath sounds. No stridor. No wheezing, rhonchi or rales.  Chest:     Chest wall: No tenderness.  Abdominal:     General: Abdomen is flat. Bowel sounds are normal. There is no distension.     Palpations: Abdomen is soft. There is no mass or pulsatile mass.     Tenderness: There is no abdominal tenderness. There is no guarding or rebound.  Musculoskeletal:     Right lower leg: No edema.     Left lower leg: No edema.  Skin:    General: Skin is warm and dry.     Coloration: Skin is not jaundiced or pale.     Findings: No bruising, erythema, lesion or rash.  Neurological:     General: No focal deficit present.     Mental Status: He is alert and  oriented to person, place, and time.     GCS: GCS eye subscore is 4. GCS verbal subscore is 5. GCS motor subscore is 6.  Psychiatric:        Mood and Affect: Mood normal.        Behavior: Behavior normal. Behavior is cooperative.    ED Results / Procedures / Treatments   Labs (all labs ordered are listed, but only abnormal results are  displayed) Labs Reviewed  BASIC METABOLIC PANEL - Abnormal; Notable for the following components:      Result Value   Glucose, Bld 126 (*)    BUN 28 (*)    Calcium 10.8 (*)    Anion gap 17 (*)    All other components within normal limits  CBC - Abnormal; Notable for the following components:   WBC 12.6 (*)    RDW 16.7 (*)    All other components within normal limits  LIPASE, BLOOD - Abnormal; Notable for the following components:   Lipase <10 (*)    All other components within normal limits  HEPATIC FUNCTION PANEL - Abnormal; Notable for the following components:   Indirect Bilirubin 1.0 (*)    All other components within normal limits  BRAIN NATRIURETIC PEPTIDE - Abnormal; Notable for the following components:   B Natriuretic Peptide 538.3 (*)    All other components within normal limits  RESP PANEL BY RT-PCR (FLU A&B, COVID) ARPGX2  MAGNESIUM  PHOSPHORUS  TSH  URINALYSIS, ROUTINE W REFLEX MICROSCOPIC    EKG EKG Interpretation  Date/Time:  Tuesday January 01 2022 17:28:48 EST Ventricular Rate:  115 PR Interval:  127 QRS Duration: 112 QT Interval:  367 QTC Calculation: 508 R Axis:   -88 Text Interpretation: Sinus tachycardia Supraventricular bigeminy Incomplete RBBB and LAFB Abnormal R-wave progression, late transition Left ventricular hypertrophy ST elevation, consider inferior injury Prolonged QT interval Compared to prior EKG, QTc has lengthened Confirmed by Regan Lemming ([id number]) on 01/01/2022 6:41:16 PM  Radiology DG Chest 2 View  Result Date: 01/01/2022 CLINICAL DATA:  Shortness of breath EXAM: CHEST - 2 VIEW COMPARISON:  2019  FINDINGS: Hiatal hernia is no longer present. Normal heart size. Lungs are clear. No pleural effusion. No pneumothorax. No acute osseous abnormality. IMPRESSION: No acute process in the chest. Electronically Signed   By: Macy Mis M.D.   On: 01/01/2022 14:43   CT Abdomen Pelvis W Contrast  Result Date: 01/01/2022 CLINICAL DATA:  Nausea for a few days without vomiting or diarrhea. Clinical concern for bowel obstruction. EXAM: CT ABDOMEN AND PELVIS WITH CONTRAST TECHNIQUE: Multidetector CT imaging of the abdomen and pelvis was performed using the standard protocol following bolus administration of intravenous contrast. RADIATION DOSE REDUCTION: This exam was performed according to the departmental dose-optimization program which includes automated exposure control, adjustment of the mA and/or kV according to patient size and/or use of iterative reconstruction technique. CONTRAST:  52mL OMNIPAQUE IOHEXOL 300 MG/ML  SOLN COMPARISON:  CTs of the chest, abdomen and pelvis 01/01/2021. Upper GI series 05/11/2021. FINDINGS: Lower chest: Interval surgical repair of previously demonstrated large hiatal hernia. There is mild wall thickening of the distal esophagus. Interval improved aeration of both lung bases with mild residual lower lobe atelectasis or scarring bilaterally. No significant pleural or pericardial effusion. Hepatobiliary: The liver is normal in density without suspicious focal abnormality. No evidence of gallstones, gallbladder wall thickening or biliary dilatation. Pancreas: Unremarkable. No pancreatic ductal dilatation or surrounding inflammatory changes. Spleen: Normal in size without focal abnormality. Adrenals/Urinary Tract: Both adrenal glands appear normal. There is a stable parapelvic cyst in the interpolar region of the left kidney which measures up to 3.2 cm. No evidence of renal mass, urinary tract calculus or hydronephrosis. The bladder appears unremarkable. Stomach/Bowel: As above, interval  repair of the previously demonstrated large hiatal hernia. The stomach is below the diaphragm. There is mild nonspecific distal esophageal and proximal gastric wall thickening. No enteric contrast was administered.  No evidence of bowel distension, wall thickening or surrounding inflammation. The appendix appears normal. Vascular/Lymphatic: There are no enlarged abdominal or pelvic lymph nodes. Mild aortic and branch vessel atherosclerosis. No acute vascular findings. Reproductive: Stable mild enlargement of the prostate gland. Other: Stable small supraumbilical hernia containing only fat. No herniated bowel. No ascites or free air. Musculoskeletal: No acute or significant osseous findings. Stable lumbar spondylosis. Scattered small sclerotic osseous lesions are unchanged, likely bone islands. IMPRESSION: 1. No evidence of bowel obstruction or definite acute abdominal process status post interval surgical repair of a large hiatal hernia. 2. Mild nonspecific distal esophageal and proximal gastric wall thickening. 3. Stable incidental findings including a left renal cyst, mild prostatomegaly and a small supraumbilical hernia containing only fat. 4. Aortic Atherosclerosis (ICD10-I70.0) and Emphysema (ICD10-J43.9). Electronically Signed   By: Richardean Sale M.D.   On: 01/01/2022 15:22    Procedures Procedures  Cardiac monitoring while in ED  Medications Ordered in ED Medications  ondansetron (ZOFRAN) injection 4 mg (4 mg Intravenous Given 01/01/22 1422)  sodium chloride 0.9 % bolus 1,000 mL (0 mLs Intravenous Stopped 01/01/22 1714)  iohexol (OMNIPAQUE) 300 MG/ML solution 80 mL (80 mLs Intravenous Contrast Given 01/01/22 1502)  metoCLOPramide (REGLAN) injection 10 mg (10 mg Intravenous Given 01/01/22 1611)  diphenhydrAMINE (BENADRYL) injection 12.5 mg (12.5 mg Intravenous Given 01/01/22 1611)    ED Course/ Medical Decision Making/ A&P                           Medical Decision Making Problems  Addressed: Dehydration: acute illness or injury Intractable nausea and vomiting: acute illness or injury Tachycardia: acute illness or injury  Amount and/or Complexity of Data Reviewed External Data Reviewed: labs, radiology, ECG and notes. Labs: ordered. Decision-making details documented in ED Course. Radiology: ordered and independent interpretation performed. Decision-making details documented in ED Course. ECG/medicine tests: ordered and independent interpretation performed. Decision-making details documented in ED Course.  Risk Prescription drug management. Decision regarding hospitalization.   This is a 71 y.o. male with a PMH of hyperlipidemia, hypertension, CKD stage III, significant recent weight loss, gastric outlet obstruction secondary to hiatal status post surgery who presents to the ED with nausea and vomiting for four days with no PO intake.   Review of Past Records: Patient was admitted Yuriy of 2022 for hiatal hernia causing gastric outlet obstruction.  He had this hernia removed.  According to patient ever since then, he has had significant 100 lb weight loss and problems with nausea and vomiting.  He presents and is in A fib RVR with rate of 120. He is asymptomatic and HDS. Appears dry and malnourished. Will reassess this following fluids. Plan to obtain labs, CXR, CT a/p, IVF, zofran  I personally reviewed all laboratory work and imaging. Abnormal results outlined below.  Mild leukocytosis to 12.6, Electrolytes stable, BMP with elevated BUN. Lipase negative, hepatic labs reassuring.  Labs consistent with dehydration. CT without acute abnormality  On reassessment, patient has converted from A fib to Sinus tachycardia. He continues to be tachycardic. He is still very weak and unable to tolerate PO intake. I feel that he is not safe to be discharged home and could benefit from admission for dehydration and poor PO intake.   [facility] will accept patient to  service.   I have seen and evaluated this patient in conjunction with my attending physician who agrees and has made changes to the plan accordingly.  Portions  of this note were generated with [employer]. Dictation errors may occur despite best attempts at proofreading.   Final Clinical Impression(s) / ED Diagnoses Final diagnoses:  Intractable nausea and vomiting  Dehydration  Tachycardia    Rx / DC Orders ED Discharge Orders     None         Adolphus Birchwood, PA-C 01/01/22 2336    Malvin Johns, MD 01/06/22 [phone]

## 2022-01-01 NOTE — ED Triage Notes (Signed)
Here vis EMS, nausea for a 'few days', n without n/d. Denies any pain anywhere. States he is unable to eat or drink d/t nausea.

## 2022-01-01 NOTE — ED Notes (Signed)
Patient able to tolerate ginger ale.

## 2022-01-01 NOTE — ED Notes (Signed)
Patient transported to X-ray 

## 2022-01-02 ENCOUNTER — Other Ambulatory Visit (HOSPITAL_COMMUNITY): Payer: Federal, State, Local not specified - PPO

## 2022-01-02 DIAGNOSIS — F1220 Cannabis dependence, uncomplicated: Secondary | ICD-10-CM | POA: Diagnosis not present

## 2022-01-02 DIAGNOSIS — I5030 Unspecified diastolic (congestive) heart failure: Secondary | ICD-10-CM | POA: Diagnosis not present

## 2022-01-02 DIAGNOSIS — Z8249 Family history of ischemic heart disease and other diseases of the circulatory system: Secondary | ICD-10-CM | POA: Diagnosis not present

## 2022-01-02 DIAGNOSIS — F17200 Nicotine dependence, unspecified, uncomplicated: Secondary | ICD-10-CM | POA: Diagnosis present

## 2022-01-02 DIAGNOSIS — E860 Dehydration: Secondary | ICD-10-CM | POA: Diagnosis present

## 2022-01-02 DIAGNOSIS — E785 Hyperlipidemia, unspecified: Secondary | ICD-10-CM | POA: Diagnosis not present

## 2022-01-02 DIAGNOSIS — E876 Hypokalemia: Secondary | ICD-10-CM | POA: Diagnosis present

## 2022-01-02 DIAGNOSIS — G894 Chronic pain syndrome: Secondary | ICD-10-CM | POA: Diagnosis present

## 2022-01-02 DIAGNOSIS — R112 Nausea with vomiting, unspecified: Secondary | ICD-10-CM | POA: Diagnosis not present

## 2022-01-02 DIAGNOSIS — Z20822 Contact with and (suspected) exposure to covid-19: Secondary | ICD-10-CM | POA: Diagnosis not present

## 2022-01-02 DIAGNOSIS — N183 Chronic kidney disease, stage 3 unspecified: Secondary | ICD-10-CM | POA: Diagnosis present

## 2022-01-02 DIAGNOSIS — K219 Gastro-esophageal reflux disease without esophagitis: Secondary | ICD-10-CM | POA: Diagnosis present

## 2022-01-02 DIAGNOSIS — I129 Hypertensive chronic kidney disease with stage 1 through stage 4 chronic kidney disease, or unspecified chronic kidney disease: Secondary | ICD-10-CM | POA: Diagnosis present

## 2022-01-02 DIAGNOSIS — R778 Other specified abnormalities of plasma proteins: Secondary | ICD-10-CM | POA: Diagnosis not present

## 2022-01-02 DIAGNOSIS — Z79899 Other long term (current) drug therapy: Secondary | ICD-10-CM | POA: Diagnosis not present

## 2022-01-02 DIAGNOSIS — Z7982 Long term (current) use of aspirin: Secondary | ICD-10-CM | POA: Diagnosis not present

## 2022-01-02 DIAGNOSIS — G629 Polyneuropathy, unspecified: Secondary | ICD-10-CM | POA: Diagnosis present

## 2022-01-02 LAB — CBC WITH DIFFERENTIAL/PLATELET
Abs Immature Granulocytes: 0.04 10*3/uL (ref 0.00–0.07)
Basophils Absolute: 0 10*3/uL (ref 0.0–0.1)
Basophils Relative: 0 %
Eosinophils Absolute: 0 10*3/uL (ref 0.0–0.5)
Eosinophils Relative: 0 %
HCT: 40.6 % (ref 39.0–52.0)
Hemoglobin: 13.4 g/dL (ref 13.0–17.0)
Immature Granulocytes: 0 %
Lymphocytes Relative: 16 %
Lymphs Abs: 2.3 10*3/uL (ref 0.7–4.0)
MCH: 27.1 pg (ref 26.0–34.0)
MCHC: 33 g/dL (ref 30.0–36.0)
MCV: 82.2 fL (ref 80.0–100.0)
Monocytes Absolute: 0.8 10*3/uL (ref 0.1–1.0)
Monocytes Relative: 5 %
Neutro Abs: 10.9 10*3/uL — ABNORMAL HIGH (ref 1.7–7.7)
Neutrophils Relative %: 79 %
Platelets: 285 10*3/uL (ref 150–400)
RBC: 4.94 MIL/uL (ref 4.22–5.81)
RDW: 17.1 % — ABNORMAL HIGH (ref 11.5–15.5)
WBC: 14 10*3/uL — ABNORMAL HIGH (ref 4.0–10.5)
nRBC: 0 % (ref 0.0–0.2)

## 2022-01-02 LAB — COMPREHENSIVE METABOLIC PANEL
ALT: 10 U/L (ref 0–44)
AST: 19 U/L (ref 15–41)
Albumin: 4.3 g/dL (ref 3.5–5.0)
Alkaline Phosphatase: 40 U/L (ref 38–126)
Anion gap: 14 (ref 5–15)
BUN: 26 mg/dL — ABNORMAL HIGH (ref 8–23)
CO2: 21 mmol/L — ABNORMAL LOW (ref 22–32)
Calcium: 9.5 mg/dL (ref 8.9–10.3)
Chloride: 104 mmol/L (ref 98–111)
Creatinine, Ser: 1.13 mg/dL (ref 0.61–1.24)
GFR, Estimated: >60 mL/min (ref >60)
Glucose, Bld: 100 mg/dL — ABNORMAL HIGH (ref 70–99)
Potassium: 3.3 mmol/L — ABNORMAL LOW (ref 3.5–5.1)
Sodium: 139 mmol/L (ref 135–145)
Total Bilirubin: 1.3 mg/dL — ABNORMAL HIGH (ref 0.3–1.2)
Total Protein: 7.3 g/dL (ref 6.5–8.1)

## 2022-01-02 LAB — URINALYSIS, ROUTINE W REFLEX MICROSCOPIC
Bilirubin Urine: NEGATIVE
Glucose, UA: NEGATIVE mg/dL
Leukocytes,Ua: NEGATIVE
Nitrite: NEGATIVE
Protein, ur: 30 mg/dL — AB
Specific Gravity, Urine: 1.042 — ABNORMAL HIGH (ref 1.005–1.030)
pH: 5.5 (ref 5.0–8.0)

## 2022-01-02 LAB — CBC
HCT: 41.5 % (ref 39.0–52.0)
Hemoglobin: 13.3 g/dL (ref 13.0–17.0)
MCH: 27.5 pg (ref 26.0–34.0)
MCHC: 32 g/dL (ref 30.0–36.0)
MCV: 85.7 fL (ref 80.0–100.0)
Platelets: 329 10*3/uL (ref 150–400)
RBC: 4.84 MIL/uL (ref 4.22–5.81)
RDW: 17 % — ABNORMAL HIGH (ref 11.5–15.5)
WBC: 14 10*3/uL — ABNORMAL HIGH (ref 4.0–10.5)
nRBC: 0 % (ref 0.0–0.2)

## 2022-01-02 LAB — CREATININE, SERUM
Creatinine, Ser: 1.14 mg/dL (ref 0.61–1.24)
GFR, Estimated: >60 mL/min (ref >60)

## 2022-01-02 LAB — TROPONIN I (HIGH SENSITIVITY)
Troponin I (High Sensitivity): 83 ng/L — ABNORMAL HIGH (ref <18)
Troponin I (High Sensitivity): 70 ng/L — ABNORMAL HIGH
Troponin I (High Sensitivity): 79 ng/L — ABNORMAL HIGH (ref <18)

## 2022-01-02 LAB — MAGNESIUM: Magnesium: 2.3 mg/dL (ref 1.7–2.4)

## 2022-01-02 MED ORDER — HYDRALAZINE HCL 20 MG/ML IJ SOLN
10.0000 mg | Freq: Four times a day (QID) | INTRAMUSCULAR | Status: DC | PRN
  Administered 2022-01-02 (×2): 10 mg via INTRAVENOUS
  Filled 2022-01-02 (×2): qty 1

## 2022-01-02 MED ORDER — ENOXAPARIN SODIUM 40 MG/0.4ML IJ SOSY
40.0000 mg | PREFILLED_SYRINGE | INTRAMUSCULAR | Status: DC
  Administered 2022-01-02 – 2022-01-04 (×3): 40 mg via SUBCUTANEOUS
  Filled 2022-01-02 (×3): qty 0.4

## 2022-01-02 MED ORDER — METOPROLOL TARTRATE 5 MG/5ML IV SOLN
5.0000 mg | Freq: Four times a day (QID) | INTRAVENOUS | Status: DC | PRN
  Administered 2022-01-02 – 2022-01-03 (×2): 5 mg via INTRAVENOUS
  Filled 2022-01-02 (×3): qty 5

## 2022-01-02 MED ORDER — PANTOPRAZOLE SODIUM 40 MG IV SOLR
40.0000 mg | Freq: Every day | INTRAVENOUS | Status: DC
  Administered 2022-01-02 – 2022-01-04 (×3): 40 mg via INTRAVENOUS
  Filled 2022-01-02 (×4): qty 40

## 2022-01-02 MED ORDER — METOCLOPRAMIDE HCL 5 MG/ML IJ SOLN
10.0000 mg | Freq: Four times a day (QID) | INTRAMUSCULAR | Status: DC | PRN
  Administered 2022-01-02: 10 mg via INTRAVENOUS
  Filled 2022-01-02: qty 2

## 2022-01-02 MED ORDER — PROMETHAZINE HCL 25 MG PO TABS
25.0000 mg | ORAL_TABLET | Freq: Four times a day (QID) | ORAL | Status: DC | PRN
  Administered 2022-01-02 – 2022-01-03 (×4): 25 mg via ORAL
  Filled 2022-01-02 (×4): qty 1

## 2022-01-02 MED ORDER — SODIUM CHLORIDE 0.9 % IV SOLN: INTRAVENOUS | Status: DC

## 2022-01-02 MED ORDER — POTASSIUM CHLORIDE 10 MEQ/100ML IV SOLN
10.0000 meq | INTRAVENOUS | Status: AC
  Administered 2022-01-02 (×4): 10 meq via INTRAVENOUS
  Filled 2022-01-02: qty 100

## 2022-01-02 MED ORDER — ONDANSETRON HCL 4 MG/2ML IJ SOLN
4.0000 mg | Freq: Once | INTRAMUSCULAR | Status: AC
  Administered 2022-01-02: 4 mg via INTRAVENOUS
  Filled 2022-01-02: qty 2

## 2022-01-02 NOTE — ED Notes (Signed)
Handoff report given to Fisher on 5 W at [employer]

## 2022-01-02 NOTE — Progress Notes (Signed)
HR down to 90s. Patient is asleep and resting. Will continue to monitor patient.

## 2022-01-02 NOTE — H&P (Signed)
History and Physical    Darrell Hoffman. ZOX:[id number] DOB: 1950/12/20 DOA: 01/01/2022  PCP: [name], MD  Patient coming from: Home  Chief Complaint: intractable N/V  HPI: Darrell Hoffman. is a 71 y.o. male with medical history significant of idiopathic polyneuropathy, chronic pain, HTN, marijuana abuse. Presenting with intractable N/V. Symptoms were sudden onset 3 days ago. He had no change in diet or medication. He tried pepto bismol but his symptoms didn't improve. He didn't have any stomach pain or other symptoms. When his symptoms persisted through yesterday afternoon, he decided to come to the ED for help. He denies any other aggravating or alleviating factors.    ED Course: Imaging was negative. He was tachycardic and hypertensive. He was given fluids and anti-emetics, but still unable to tolerate PO. TR[facility] was called for admission.   Review of Systems:  Denies CP, palpitations, dyspnea, diarrhea, fever, sick contacts, abdominal pain.  Review of systems is otherwise negative for all not mentioned in HPI.   PMHx Past Medical History:  Diagnosis Date   ALCOHOL ABUSE, HX OF 10/03/2007   ALLERGIC RHINITIS 10/03/2007   ANEMIA-B12 DEFICIENCY 12/04/2009   ANEMIA-IRON DEFICIENCY 11/27/2009   ANEMIA-NOS 10/08/2007   ASTHMA 10/03/2007   Asthma    B12 DEFICIENCY 10/06/2008   CKD (chronic kidney disease) stage 3, GFR 30-59 ml/min (HCC) 03/31/2018   COLONIC POLYPS, HX OF 10/03/2007   ERECTILE DYSFUNCTION 10/03/2007   GERD 10/03/2007   History of hiatal hernia    found on CXT in June 2021   HYPERLIPIDEMIA 10/03/2007   HYPERTENSION 10/03/2007   KNEE PAIN, RIGHT 10/08/2007   Morbid obesity (HCC) 10/03/2007   Neuromuscular disorder (HCC)    per pt. never diagnosed   Substance abuse (HCC)    marijuana daily    PSHx Past Surgical History:  Procedure Laterality Date   bilateral hydrocele repair     COLONOSCOPY  06/06/2016   POLYPECTOMY  01-03-2010   s/p left knee arthroscopy   2009   s/p TKR  03/2009   VASECTOMY     XI ROBOTIC ASSISTED HIATAL HERNIA REPAIR N/A 01/04/2021   Procedure: XI ROBOTIC ASSISTED HIATAL HERNIA REPAIR AND FUNDOPLICATION WITH MESH;  Surgeon: Axel Filler, MD;  Location: [facility];  Service: General;  Laterality: N/A;    SocHx  reports that he has been smoking. He has never used smokeless tobacco. He reports that he does not currently use alcohol. He reports current drug use. Drug: Marijuana.  No Known Allergies  FamHx Family History  Problem Relation Age of Onset   Breast cancer Mother    Lupus Sister    Hypertension Sister    Diabetes Sister    Alcohol abuse Paternal Uncle    Coronary artery disease Other        male 1st degree relative - MI at 70 yo   Diabetes Cousin        2 cousins   Colon cancer Neg Hx    Esophageal cancer Neg Hx    Rectal cancer Neg Hx    Stomach cancer Neg Hx    Colon polyps Neg Hx     Prior to Admission medications   Medication Sig Start Date End Date Taking? Authorizing Provider  gabapentin (NEURONTIN) 600 MG tablet Take 1 tablet (600 mg total) by mouth 4 (four) times daily. 08/15/21  Yes Jaffe, Adam R, DO  acetaminophen-codeine (TYLENOL #3) 300-30 MG tablet Take 1 tablet by mouth every 8 (eight) hours as needed  for moderate pain. Patient not taking: Reported on 11/15/2021 07/19/21   Magnant, Joycie Peek, PA-C  acetaminophen-codeine (TYLENOL #3) 300-30 MG tablet Take 1 tablet by mouth every 4 (four) hours as needed for moderate pain. Patient not taking: Reported on 11/15/2021 07/29/21   Cammy Copa, MD  albuterol (PROVENTIL HFA) 108 [phone] Base) MCG/ACT inhaler Inhale 2 puffs into the lungs every 4 (four) hours as needed. Patient not taking: Reported on 01/01/2022 11/03/19   [name], MD  ASPIRIN LOW DOSE 81 MG EC tablet TAKE 1 TABLET(81 MG) BY MOUTH DAILY. SWALLOW WHOLE Patient not taking: Reported on 11/15/2021 01/17/20   [name], MD  CIALIS 20 MG tablet TAKE 1 TABLET BY MOUTH DAILY AS NEEDED  04/23/12   [name], MD  fluticasone [facility]) 50 MCG/ACT nasal spray Place 2 sprays into both nostrils daily. 03/17/19   [name], MD  omeprazole (PRILOSEC) 40 MG capsule TAKE 1 CAPSULE(40 MG) BY MOUTH IN THE MORNING AND AT BEDTIME Patient not taking: Reported on 08/15/2021 04/16/21   Meryl Dare, MD  predniSONE (STERAPRED UNI-PAK 21 TAB) 10 MG (21) TBPK tablet Take as directed on packaging Patient not taking: Reported on 08/15/2021 07/19/21   Magnant, Joycie Peek, PA-C  vitamin B-12 (CYANOCOBALAMIN) 1000 MCG tablet Take 1 tablet (1,000 mcg total) by mouth daily. Patient not taking: Reported on 01/01/2022 06/07/20   [name], MD  Monte Fantasia INHUB 500-50 MCG/DOSE AEPB INHALE 1 PUFF INTO THE LUNGS TWICE DAILY Patient not taking: Reported on 08/15/2021 11/13/20   [name], MD    Physical Exam: Vitals:   01/02/22 0530 01/02/22 0545 01/02/22 0600 01/02/22 0854  BP: (!) 159/102 (!) 185/107 (!) 164/118 (!) 143/89  Pulse: (!) 107 (!) 111 (!) 106 76  Resp:  14 12 20   Temp:    98.9 F (37.2 C)  TempSrc:    Oral  SpO2: 96% 96% 95% 100%  Weight:      Height:        General: 71 y.o. male resting in bed in NAD Eyes: PERRL, normal sclera ENMT: Nares patent w/o discharge, orophaynx clear, dentition normal, ears w/o discharge/lesions/ulcers Neck: Supple, trachea midline Cardiovascular: tachy, +S1, S2, no m/g/r, equal pulses throughout Respiratory: CTABL, no w/r/r, normal WOB GI: BS hypoactive, NDNT, no masses noted, no organomegaly noted MSK: No e/c/c Neuro: A&O x 3, no focal deficits Psyc: Appropriate interaction and affect, calm/cooperative  Labs on Admission: I have personally reviewed following labs and imaging studies  CBC: Recent Labs  Lab 01/01/22 1153 01/02/22 0450  WBC 12.6* 14.0*  NEUTROABS  --  10.9*  HGB 13.7 13.4  HCT 42.1 40.6  MCV 81.3 82.2  PLT 331 285   Basic Metabolic Panel: Recent Labs  Lab 01/01/22 1153 01/02/22 0450  NA 140 139  K 3.9 3.3*  CL 100  104  CO2 23 21*  GLUCOSE 126* 100*  BUN 28* 26*  CREATININE 1.16 1.13  CALCIUM 10.8* 9.5  MG 2.2  --   PHOS 3.1  --    GFR: Estimated Creatinine Clearance: 53.4 mL/min (by C-G formula based on SCr of 1.13 mg/dL). Liver Function Tests: Recent Labs  Lab 01/01/22 1153 01/02/22 0450  AST 17 19  ALT 12 10  ALKPHOS 44 40  BILITOT 1.2 1.3*  PROT 8.1 7.3  ALBUMIN 4.7 4.3   Recent Labs  Lab 01/01/22 1153  LIPASE <10*   No results for input(s): AMMONIA in the last 168  hours. Coagulation Profile: No results for input(s): INR, PROTIME in the last 168 hours. Cardiac Enzymes: No results for input(s): CKTOTAL, CKMB, CKMBINDEX, TROPONINI in the last 168 hours. BNP (last 3 results) No results for input(s): PROBNP in the last 8760 hours. HbA1C: No results for input(s): HGBA1C in the last 72 hours. CBG: No results for input(s): GLUCAP in the last 168 hours. Lipid Profile: No results for input(s): CHOL, HDL, LDLCALC, TRIG, CHOLHDL, LDLDIRECT in the last 72 hours. Thyroid Function Tests: Recent Labs    01/01/22 1613  TSH 3.383   Anemia Panel: No results for input(s): VITAMINB12, FOLATE, FERRITIN, TIBC, IRON, RETICCTPCT in the last 72 hours. Urine analysis:    Component Value Date/Time   COLORURINE YELLOW 01/02/2022 0619   APPEARANCEUR CLEAR 01/02/2022 0619   LABSPEC 1.042 (H) 01/02/2022 0619   PHURINE 5.5 01/02/2022 0619   GLUCOSEU NEGATIVE 01/02/2022 0619   GLUCOSEU NEGATIVE 12/11/2020 1342   HGBUR TRACE (A) 01/02/2022 0619   BILIRUBINUR NEGATIVE 01/02/2022 0619   KETONESUR TRACE (A) 01/02/2022 0619   PROTEINUR 30 (A) 01/02/2022 0619   UROBILINOGEN 0.2 12/11/2020 1342   NITRITE NEGATIVE 01/02/2022 0619   LEUKOCYTESUR NEGATIVE 01/02/2022 0619    Radiological Exams on Admission: DG Chest 2 View  Result Date: 01/01/2022 CLINICAL DATA:  Shortness of breath EXAM: CHEST - 2 VIEW COMPARISON:  2019 FINDINGS: Hiatal hernia is no longer present. Normal heart size. Lungs are  clear. No pleural effusion. No pneumothorax. No acute osseous abnormality. IMPRESSION: No acute process in the chest. Electronically Signed   By: Guadlupe Spanish M.D.   On: 01/01/2022 14:43   CT Abdomen Pelvis W Contrast  Result Date: 01/01/2022 CLINICAL DATA:  Nausea for a few days without vomiting or diarrhea. Clinical concern for bowel obstruction. EXAM: CT ABDOMEN AND PELVIS WITH CONTRAST TECHNIQUE: Multidetector CT imaging of the abdomen and pelvis was performed using the standard protocol following bolus administration of intravenous contrast. RADIATION DOSE REDUCTION: This exam was performed according to the departmental dose-optimization program which includes automated exposure control, adjustment of the mA and/or kV according to patient size and/or use of iterative reconstruction technique. CONTRAST:  80mL OMNIPAQUE IOHEXOL 300 MG/ML  SOLN COMPARISON:  CTs of the chest, abdomen and pelvis 01/01/2021. Upper GI series 05/11/2021. FINDINGS: Lower chest: Interval surgical repair of previously demonstrated large hiatal hernia. There is mild wall thickening of the distal esophagus. Interval improved aeration of both lung bases with mild residual lower lobe atelectasis or scarring bilaterally. No significant pleural or pericardial effusion. Hepatobiliary: The liver is normal in density without suspicious focal abnormality. No evidence of gallstones, gallbladder wall thickening or biliary dilatation. Pancreas: Unremarkable. No pancreatic ductal dilatation or surrounding inflammatory changes. Spleen: Normal in size without focal abnormality. Adrenals/Urinary Tract: Both adrenal glands appear normal. There is a stable parapelvic cyst in the interpolar region of the left kidney which measures up to 3.2 cm. No evidence of renal mass, urinary tract calculus or hydronephrosis. The bladder appears unremarkable. Stomach/Bowel: As above, interval repair of the previously demonstrated large hiatal hernia. The stomach is  below the diaphragm. There is mild nonspecific distal esophageal and proximal gastric wall thickening. No enteric contrast was administered. No evidence of bowel distension, wall thickening or surrounding inflammation. The appendix appears normal. Vascular/Lymphatic: There are no enlarged abdominal or pelvic lymph nodes. Mild aortic and branch vessel atherosclerosis. No acute vascular findings. Reproductive: Stable mild enlargement of the prostate gland. Other: Stable small supraumbilical hernia containing only fat. No herniated bowel.  No ascites or free air. Musculoskeletal: No acute or significant osseous findings. Stable lumbar spondylosis. Scattered small sclerotic osseous lesions are unchanged, likely bone islands. IMPRESSION: 1. No evidence of bowel obstruction or definite acute abdominal process status post interval surgical repair of a large hiatal hernia. 2. Mild nonspecific distal esophageal and proximal gastric wall thickening. 3. Stable incidental findings including a left renal cyst, mild prostatomegaly and a small supraumbilical hernia containing only fat. 4. Aortic Atherosclerosis (ICD10-I70.0) and Emphysema (ICD10-J43.9). Electronically Signed   By: Carey Bullocks M.D.   On: 01/01/2022 15:22    EKG: Independently reviewed. Sinus tach, no st elevations  Assessment/Plan Intractable N/V ?cyclical vomiting     - placed in tele, obs     - hx of mj use; question if this is cyclical vomiting     - fluids, anti-emetics     - NPO except ice chips for now  Hypokalemia     - replace K+, check Mg2+  Chronic pain Neuropathy     - resume home regimen as able  HTN     - PRN anti-hypertensives available     - resume home regimen when tolerating PO  Elevated BNP Elevated troponin     - no previous Hx of HF     - exam doesn't show volume overload     - will check echo     - trend trp  DVT prophylaxis: lovenox  Code Status: FULL  Family Communication: w/ family at bedside  Consults  called: None   Status is: Inpatient d/t severity of illness  Rosaland Shiffman A Bintou Lafata DO [facility]  If 7PM-7AM, please contact night-coverage [email or web address]  01/02/2022, 9:25 AM

## 2022-01-02 NOTE — ED Notes (Signed)
MD made aware of patient's BP. Pt has no complaints at this time, denies headache, dizziness, vision changes, or chest pain.

## 2022-01-02 NOTE — ED Notes (Signed)
Hand-off report given to [facility].  

## 2022-01-02 NOTE — ED Notes (Signed)
Attempt to call report to floor RN

## 2022-01-02 NOTE — Progress Notes (Addendum)
Patient's HR in the 130s and still nauseous after receiving Reglan IV. Dr. Marylyn Ishihara informed.  Received new orders for EKG, Metoprolol and Phenergan. Will continue to monitor patient closely.   Post medications VS .BP (!) 144/94 (BP Location: Left Arm)    Pulse 100    Temp 98.7 F (37.1 C) (Oral)    Resp 18    Ht 5\' 6"  (1.676 m)    Wt 61.2 kg    SpO2 100%    BMI 21.79 kg/m

## 2022-01-02 NOTE — ED Notes (Signed)
Pt actively vomiting, MD made aware of prolonged QT, 4mg  IV zofran order placed.

## 2022-01-02 NOTE — ED Notes (Signed)
[facility] at the bedside °

## 2022-01-03 ENCOUNTER — Inpatient Hospital Stay (HOSPITAL_COMMUNITY): Payer: Federal, State, Local not specified - PPO

## 2022-01-03 DIAGNOSIS — I5030 Unspecified diastolic (congestive) heart failure: Secondary | ICD-10-CM

## 2022-01-03 DIAGNOSIS — R778 Other specified abnormalities of plasma proteins: Secondary | ICD-10-CM

## 2022-01-03 LAB — ECHOCARDIOGRAM COMPLETE
AR max vel: 2 cm2
AV Area VTI: 2.14 cm2
AV Area mean vel: 1.79 cm2
AV Mean grad: 2 mmHg
AV Peak grad: 4 mmHg
Ao pk vel: 1 m/s
Area-P 1/2: 3.65 cm2
Calc EF: 59.1 %
Height: 66 in
MV VTI: 0.19 cm2
S' Lateral: 2.1 cm
Single Plane A2C EF: 54.4 %
Single Plane A4C EF: 64.2 %
Weight: 2160 oz

## 2022-01-03 LAB — COMPREHENSIVE METABOLIC PANEL
ALT: 13 U/L (ref 0–44)
AST: 18 U/L (ref 15–41)
Albumin: 4 g/dL (ref 3.5–5.0)
Alkaline Phosphatase: 37 U/L — ABNORMAL LOW (ref 38–126)
Anion gap: 12 (ref 5–15)
BUN: 26 mg/dL — ABNORMAL HIGH (ref 8–23)
CO2: 19 mmol/L — ABNORMAL LOW (ref 22–32)
Calcium: 8.8 mg/dL — ABNORMAL LOW (ref 8.9–10.3)
Chloride: 109 mmol/L (ref 98–111)
Creatinine, Ser: 0.98 mg/dL (ref 0.61–1.24)
GFR, Estimated: >60 mL/min (ref >60)
Glucose, Bld: 85 mg/dL (ref 70–99)
Potassium: 3.4 mmol/L — ABNORMAL LOW (ref 3.5–5.1)
Sodium: 140 mmol/L (ref 135–145)
Total Bilirubin: 1.2 mg/dL (ref 0.3–1.2)
Total Protein: 6.7 g/dL (ref 6.5–8.1)

## 2022-01-03 LAB — CBC
HCT: 36.4 % — ABNORMAL LOW (ref 39.0–52.0)
Hemoglobin: 11.9 g/dL — ABNORMAL LOW (ref 13.0–17.0)
MCH: 27.9 pg (ref 26.0–34.0)
MCHC: 32.7 g/dL (ref 30.0–36.0)
MCV: 85.2 fL (ref 80.0–100.0)
Platelets: 230 10*3/uL (ref 150–400)
RBC: 4.27 MIL/uL (ref 4.22–5.81)
RDW: 17.2 % — ABNORMAL HIGH (ref 11.5–15.5)
WBC: 10.9 10*3/uL — ABNORMAL HIGH (ref 4.0–10.5)
nRBC: 0 % (ref 0.0–0.2)

## 2022-01-03 LAB — HIV ANTIBODY (ROUTINE TESTING W REFLEX): HIV Screen 4th Generation wRfx: NONREACTIVE

## 2022-01-03 MED ORDER — LABETALOL HCL 5 MG/ML IV SOLN
10.0000 mg | INTRAVENOUS | Status: DC | PRN
  Administered 2022-01-04: 10 mg via INTRAVENOUS
  Filled 2022-01-03: qty 4

## 2022-01-03 MED ORDER — POTASSIUM CHLORIDE CRYS ER 20 MEQ PO TBCR
40.0000 meq | EXTENDED_RELEASE_TABLET | Freq: Once | ORAL | Status: AC
  Administered 2022-01-03: 40 meq via ORAL
  Filled 2022-01-03: qty 2

## 2022-01-03 MED ORDER — ENSURE ENLIVE PO LIQD
237.0000 mL | Freq: Two times a day (BID) | ORAL | Status: DC
  Administered 2022-01-03 – 2022-01-04 (×3): 237 mL via ORAL

## 2022-01-03 MED ORDER — ADULT MULTIVITAMIN W/MINERALS CH
1.0000 | ORAL_TABLET | Freq: Every day | ORAL | Status: DC
  Administered 2022-01-03 – 2022-01-04 (×2): 1 via ORAL
  Filled 2022-01-03 (×2): qty 1

## 2022-01-03 MED ORDER — AMLODIPINE BESYLATE 5 MG PO TABS
5.0000 mg | ORAL_TABLET | Freq: Every day | ORAL | Status: DC
  Administered 2022-01-03: 5 mg via ORAL
  Filled 2022-01-03: qty 1

## 2022-01-03 NOTE — Progress Notes (Signed)
Initial Nutrition Assessment  DOCUMENTATION CODES:   Non-severe (moderate) malnutrition in context of chronic illness  INTERVENTION:   -Ensure Plus High Protein po BID, each supplement provides 350 kcal and 20 grams of protein.  -Multivitamin with minerals daily  -Placed order for lunch tray  NUTRITION DIAGNOSIS:   Moderate Malnutrition related to chronic illness (polyneuropathy, chronic pain) as evidenced by mild fat depletion, mild muscle depletion, percent weight loss.  GOAL:   Patient will meet greater than or equal to 90% of their needs  MONITOR:   PO intake, Supplement acceptance, Labs, Weight trends, I & O's, Diet advancement  REASON FOR ASSESSMENT:   Malnutrition Screening Tool    ASSESSMENT:   71 y.o. male with medical history significant of idiopathic polyneuropathy, chronic pain, HTN, marijuana abuse. Presenting with intractable N/V. Symptoms were sudden onset 3 days ago.  Patient in room, family at bedside.  Pt not very conversant and neither was his family member. Pt did answer my questions but kept his head turned to the side with eyes closed. Pt states he last ate a meal about a week ago. Reports he ate well following his hernia surgery in February 2022.  Pt states he has not had any clears yet today. Was okay with RD ordering him some chicken broth.  Agreeable to having protein shakes once diet advanced past clears. Now on full liquids.  Pt reports >100 lbs of weight loss over the past year. Per weight records, pt has lost 30 lbs since 12/9 (18% wt loss x 1.5 months, significant for time frame).   Medications: Phenergan  Labs reviewed:  Low K   NUTRITION - FOCUSED PHYSICAL EXAM:  Flowsheet Row Most Recent Value  Orbital Region No depletion  Upper Arm Region Moderate depletion  Thoracic and Lumbar Region Unable to assess  Buccal Region Mild depletion  Temple Region Mild depletion  Clavicle Bone Region Unable to assess  Clavicle and Acromion Bone  Region Unable to assess  Scapular Bone Region Unable to assess  Dorsal Hand Mild depletion  Patellar Region Unable to assess  Anterior Thigh Region Unable to assess  Posterior Calf Region Unable to assess  Edema (RD Assessment) None  Hair Reviewed  Eyes Unable to assess  [kept eyes closed, turned head away]  Mouth Unable to assess  Skin Reviewed  Nails Unable to assess       Diet Order:   Diet Order             Diet full liquid Room service appropriate? Yes; Fluid consistency: Thin  Diet effective now                   EDUCATION NEEDS:   No education needs have been identified at this time  Skin:  Skin Assessment: Reviewed RN Assessment  Last BM:  1/15  Height:   Ht Readings from Last 1 Encounters:  01/01/22 5\' 6"  (1.676 m)    Weight:   Wt Readings from Last 1 Encounters:  01/01/22 61.2 kg    BMI:  Body mass index is 21.79 kg/m.  Estimated Nutritional Needs:   Kcal:  1600-1800  Protein:  75-85g  Fluid:  1.8L/day  Clayton Bibles, MS, RD, LDN Inpatient Clinical Dietitian Contact information available via Amion

## 2022-01-03 NOTE — Progress Notes (Signed)
°  Transition of Care [facility]) Screening Note   Patient Details  Name: Darrell Hoffman. Date of Birth: [birth year]   Transition of Care [facility]) CM/SW Contact:    Dessa Phi, RN Phone Number: 01/03/2022, 12:35 PM    Transition of Care Department [facility]) has reviewed patient and no TOC needs have been identified at this time. We will continue to monitor patient advancement through interdisciplinary progression rounds. If new patient transition needs arise, please place a TOC consult.

## 2022-01-03 NOTE — Progress Notes (Signed)
PROGRESS NOTE    Darrell Hoffman.  WUJ:[id number] DOB: March 28, 1951 DOA: 01/01/2022 PCP: [name], MD    Brief Narrative:  Darrell Mcgrane. is a 71 year old male with past medical history significant for idiopathic polyneuropathy, chronic pain syndrome, essential hypertension, chronic marijuana abuse who presented to [facility] ED on 1/17 with intractable nausea and vomiting.  Patient reports onset 3 days prior, no changes in diet or medication.  Attempted Pepto-Bismol without improvement of symptoms.  Denies abdominal pain.  Continues to endorse daily marijuana abuse.  In the ED, temperature 99.1 F, HR 105, RR 30, BP 139/95, SPO2 97% on room air.  Sodium 140, potassium 3.9, chloride 100, CO2 23, glucose 126, BUN 28, creatinine 1.16.  Lipase less than 10.  AST 17, ALT 12, total bilirubin 1.2.  BNP 538.3.  WBC 12.6, hemoglobin 13.7, platelets 331.  High sensitive troponin 83>70>79.  TSH 3.383.  COVID-19 PCR negative.  Influenza A/B PCR negative.  Urinalysis unrevealing.  Chest x-ray with no acute cardiopulmonary disease process.  CT abdomen/pelvis with contrast with no evidence of bowel obstruction or definite acute abdominal process, nonspecific distal esophageal and proximal gastric wall thickening, left renal cyst, mild prostamegaly, status post interval surgical repair of a large hiatal hernia.  Patient was given IV fluids and antiemetics by EDP with continued inability to tolerate p.o.  Hospital service was consulted for further evaluation management of intractable nausea and vomiting.   Assessment & Plan:   Principal Problem:   Intractable nausea and vomiting Active Problems:   Hyperlipidemia   Morbid obesity (HCC)   GERD   Tetrahydrocannabinol (THC) use disorder, moderate, dependence (HCC)   Neuropathy   Intractable nausea/vomiting likely secondary to cannabinoid hyperemesis syndrome Patient presenting to ED with 3-day history of persistent nausea/vomiting, attempted Pepto-Bismol without  effect.  Continues with daily cannabinoid abuse.  CT abdomen/pelvis with no acute intra-abdominal findings.  --Advance to clear liquid diet --Reglan 10 mg IV q6h PRN nausea/vomiting --Phenergan 25mg  PO q6h PRN  --Continue IVF with NS at 75 mL/h  Hypokalemia Potassium 3.4 this morning, will replete.  Magnesium 2.3. --Repeat BMP in a.m.  Elevated troponin Elevated BNP High sensitive troponin elevated on admission,83>70>79; flat.  EKG with sinus tachycardia, rate 122, no concerning dynamic changes with no ST elevation/depression or T wave inversions.  Denies chest pain. --TTE: Pending --Continue monitor on telemetry  Essential hypertension Patient not on antihypertensives outpatient.  Patient has been noted to have slight upward trend of his blood pressure during hospitalization. --Start amlodipine 5 mg p.o. daily --Continue monitor BP closely  GERD: --Protonix 40 mg IV every 24 hours  Tetrahydrocannabinol (THC) use disorder, moderate, dependence Likely contributing factor to his intractable nausea/vomiting.  Counseled on need for cessation.  DVT prophylaxis: enoxaparin (LOVENOX) injection 40 mg Start: 01/02/22 1200   Code Status: Full Code Family Communication: No family present at bedside this morning  Disposition Plan:  Level of care: Telemetry Status is: Inpatient  Remains inpatient appropriate because: Continues with IV fluid hydration, advancing diet.  Anticipate possible discharge home tomorrow.   Consultants:  None  Procedures:  None  Antimicrobials:  None   Subjective: Patient seen examined bedside, resting comfortably.  States nausea much improved, no further vomiting.  Discussed we will slowly advance diet to clear liquids today.  Also blood pressure remains elevated starting amlodipine.  No other specific questions or concerns at this time.  Denies headache, no dizziness, no chest pain, palpitations, no shortness of breath, no abdominal  pain, no current  nausea/vomiting/diarrhea, no fever/chills/night sweats, no weakness, no fatigue, no paresthesias.  No acute events overnight per nursing staff.  Objective: Vitals:   01/03/22 0114 01/03/22 0517 01/03/22 0715 01/03/22 0928  BP: (!) 170/109 (!) 171/106 (!) 159/98 (!) 159/93  Pulse: (!) 101 100 86 84  Resp: 16 16  18   Temp: 97.9 F (36.6 C) (!) 97.4 F (36.3 C)  98.3 F (36.8 C)  TempSrc: Oral Oral    SpO2: 100% 100%  100%  Weight:      Height:        Intake/Output Summary (Last 24 hours) at 01/03/2022 1029 Last data filed at 01/03/2022 0600 Gross per 24 hour  Intake 120 ml  Output 1250 ml  Net -1130 ml   Filed Weights   01/01/22 1147  Weight: 61.2 kg    Examination:  General exam: Appears calm and comfortable  Respiratory system: Clear to auscultation. Respiratory effort normal.  On room air Cardiovascular system: S1 & S2 heard, RRR. No JVD, murmurs, rubs, gallops or clicks. No pedal edema. Gastrointestinal system: Abdomen is nondistended, soft and nontender. No organomegaly or masses felt. Normal bowel sounds heard. Central nervous system: Alert and oriented. No focal neurological deficits. Extremities: Symmetric 5 x 5 power. Skin: No rashes, lesions or ulcers Psychiatry: Judgement and insight appear normal. Mood & affect appropriate.     Data Reviewed: I have personally reviewed following labs and imaging studies  CBC: Recent Labs  Lab 01/01/22 1153 01/02/22 0450 01/02/22 1001 01/03/22 0341  WBC 12.6* 14.0* 14.0* 10.9*  NEUTROABS  --  10.9*  --   --   HGB 13.7 13.4 13.3 11.9*  HCT 42.1 40.6 41.5 36.4*  MCV 81.3 82.2 85.7 85.2  PLT 331 285 329 230   Basic Metabolic Panel: Recent Labs  Lab 01/01/22 1153 01/02/22 0450 01/02/22 1001 01/03/22 0341  NA 140 139  --  140  K 3.9 3.3*  --  3.4*  CL 100 104  --  109  CO2 23 21*  --  19*  GLUCOSE 126* 100*  --  85  BUN 28* 26*  --  26*  CREATININE 1.16 1.13 1.14 0.98  CALCIUM 10.8* 9.5  --  8.8*  MG 2.2  --   2.3  --   PHOS 3.1  --   --   --    GFR: Estimated Creatinine Clearance: 61.6 mL/min (by C-G formula based on SCr of 0.98 mg/dL). Liver Function Tests: Recent Labs  Lab 01/01/22 1153 01/02/22 0450 01/03/22 0341  AST 17 19 18   ALT 12 10 13   ALKPHOS 44 40 37*  BILITOT 1.2 1.3* 1.2  PROT 8.1 7.3 6.7  ALBUMIN 4.7 4.3 4.0   Recent Labs  Lab 01/01/22 1153  LIPASE <10*   No results for input(s): AMMONIA in the last 168 hours. Coagulation Profile: No results for input(s): INR, PROTIME in the last 168 hours. Cardiac Enzymes: No results for input(s): CKTOTAL, CKMB, CKMBINDEX, TROPONINI in the last 168 hours. BNP (last 3 results) No results for input(s): PROBNP in the last 8760 hours. HbA1C: No results for input(s): HGBA1C in the last 72 hours. CBG: No results for input(s): GLUCAP in the last 168 hours. Lipid Profile: No results for input(s): CHOL, HDL, LDLCALC, TRIG, CHOLHDL, LDLDIRECT in the last 72 hours. Thyroid Function Tests: Recent Labs    01/01/22 1613  TSH 3.383   Anemia Panel: No results for input(s): VITAMINB12, FOLATE, FERRITIN, TIBC, IRON, RETICCTPCT in the last  72 hours. Sepsis Labs: No results for input(s): PROCALCITON, LATICACIDVEN in the last 168 hours.  Recent Results (from the past 240 hour(s))  Resp Panel by RT-PCR (Flu A&B, Covid) Nasopharyngeal Swab     Status: None   Collection Time: 01/01/22  3:28 PM   Specimen: Nasopharyngeal Swab; Nasopharyngeal(NP) swabs in vial transport medium  Result Value Ref Range Status   SARS Coronavirus 2 by RT PCR NEGATIVE NEGATIVE Final    Comment: (NOTE) SARS-CoV-2 target nucleic acids are NOT DETECTED.  The SARS-CoV-2 RNA is generally detectable in upper respiratory specimens during the acute phase of infection. The lowest concentration of SARS-CoV-2 viral copies this assay can detect is 138 copies/mL. A negative result does not preclude SARS-Cov-2 infection and should not be used as the sole basis for treatment  or other patient management decisions. A negative result may occur with  improper specimen collection/handling, submission of specimen other than nasopharyngeal swab, presence of viral mutation(s) within the areas targeted by this assay, and inadequate number of viral copies(<138 copies/mL). A negative result must be combined with clinical observations, patient history, and epidemiological information. The expected result is Negative.  Fact Sheet for Patients:  BloggerCourse.com  Fact Sheet for Healthcare Providers:  SeriousBroker.it  This test is no t yet approved or cleared by the [place] FDA and  has been authorized for detection and/or diagnosis of SARS-CoV-2 by FDA under an Emergency Use Authorization (EUA). This EUA will remain  in effect (meaning this test can be used) for the duration of the COVID-19 declaration under Section 564(b)(1) of the Act, 21 U.S.C.section 360bbb-3(b)(1), unless the authorization is terminated  or revoked sooner.       Influenza A by PCR NEGATIVE NEGATIVE Final   Influenza B by PCR NEGATIVE NEGATIVE Final    Comment: (NOTE) The Xpert Xpress SARS-CoV-2/FLU/RSV plus assay is intended as an aid in the diagnosis of influenza from Nasopharyngeal swab specimens and should not be used as a sole basis for treatment. Nasal washings and aspirates are unacceptable for Xpert Xpress SARS-CoV-2/FLU/RSV testing.  Fact Sheet for Patients: BloggerCourse.com  Fact Sheet for Healthcare Providers: SeriousBroker.it  This test is not yet approved or cleared by the [place] FDA and has been authorized for detection and/or diagnosis of SARS-CoV-2 by FDA under an Emergency Use Authorization (EUA). This EUA will remain in effect (meaning this test can be used) for the duration of the COVID-19 declaration under Section 564(b)(1) of the Act, 21 U.S.C. section  360bbb-3(b)(1), unless the authorization is terminated or revoked.  Performed at [facility], [address] [place], [place] [address]          Radiology Studies: DG Chest 2 View  Result Date: 01/01/2022 CLINICAL DATA:  Shortness of breath EXAM: CHEST - 2 VIEW COMPARISON:  2019 FINDINGS: Hiatal hernia is no longer present. Normal heart size. Lungs are clear. No pleural effusion. No pneumothorax. No acute osseous abnormality. IMPRESSION: No acute process in the chest. Electronically Signed   By: Guadlupe Spanish M.D.   On: 01/01/2022 14:43   CT Abdomen Pelvis W Contrast  Result Date: 01/01/2022 CLINICAL DATA:  Nausea for a few days without vomiting or diarrhea. Clinical concern for bowel obstruction. EXAM: CT ABDOMEN AND PELVIS WITH CONTRAST TECHNIQUE: Multidetector CT imaging of the abdomen and pelvis was performed using the standard protocol following bolus administration of intravenous contrast. RADIATION DOSE REDUCTION: This exam was performed according to the departmental dose-optimization program which includes automated exposure control, adjustment of  the mA and/or kV according to patient size and/or use of iterative reconstruction technique. CONTRAST:  80mL OMNIPAQUE IOHEXOL 300 MG/ML  SOLN COMPARISON:  CTs of the chest, abdomen and pelvis 01/01/2021. Upper GI series 05/11/2021. FINDINGS: Lower chest: Interval surgical repair of previously demonstrated large hiatal hernia. There is mild wall thickening of the distal esophagus. Interval improved aeration of both lung bases with mild residual lower lobe atelectasis or scarring bilaterally. No significant pleural or pericardial effusion. Hepatobiliary: The liver is normal in density without suspicious focal abnormality. No evidence of gallstones, gallbladder wall thickening or biliary dilatation. Pancreas: Unremarkable. No pancreatic ductal dilatation or surrounding inflammatory changes. Spleen: Normal in size without  focal abnormality. Adrenals/Urinary Tract: Both adrenal glands appear normal. There is a stable parapelvic cyst in the interpolar region of the left kidney which measures up to 3.2 cm. No evidence of renal mass, urinary tract calculus or hydronephrosis. The bladder appears unremarkable. Stomach/Bowel: As above, interval repair of the previously demonstrated large hiatal hernia. The stomach is below the diaphragm. There is mild nonspecific distal esophageal and proximal gastric wall thickening. No enteric contrast was administered. No evidence of bowel distension, wall thickening or surrounding inflammation. The appendix appears normal. Vascular/Lymphatic: There are no enlarged abdominal or pelvic lymph nodes. Mild aortic and branch vessel atherosclerosis. No acute vascular findings. Reproductive: Stable mild enlargement of the prostate gland. Other: Stable small supraumbilical hernia containing only fat. No herniated bowel. No ascites or free air. Musculoskeletal: No acute or significant osseous findings. Stable lumbar spondylosis. Scattered small sclerotic osseous lesions are unchanged, likely bone islands. IMPRESSION: 1. No evidence of bowel obstruction or definite acute abdominal process status post interval surgical repair of a large hiatal hernia. 2. Mild nonspecific distal esophageal and proximal gastric wall thickening. 3. Stable incidental findings including a left renal cyst, mild prostatomegaly and a small supraumbilical hernia containing only fat. 4. Aortic Atherosclerosis (ICD10-I70.0) and Emphysema (ICD10-J43.9). Electronically Signed   By: Carey Bullocks M.D.   On: 01/01/2022 15:22        Scheduled Meds:  amLODipine  5 mg Oral Daily   enoxaparin (LOVENOX) injection  40 mg Subcutaneous Q24H   pantoprazole (PROTONIX) IV  40 mg Intravenous Daily   Continuous Infusions:  sodium chloride 75 mL/hr at 01/03/22 0824     LOS: 1 day    Time spent: 42 minutes spent on chart review, discussion  with nursing staff, consultants, updating family and interview/physical exam; more than 50% of that time was spent in counseling and/or coordination of care.    Alvira Philips [place], DO [facility] Available via Epic secure chat 7am-7pm After these hours, please refer to coverage provider listed on [email or web address] 01/03/2022, 10:29 AM

## 2022-01-03 NOTE — Progress Notes (Signed)
° °  Echocardiogram 2D Echocardiogram has been performed.  Darrell Hoffman 01/03/2022, 12:05 PM

## 2022-01-04 LAB — BASIC METABOLIC PANEL
Anion gap: 10 (ref 5–15)
BUN: 21 mg/dL (ref 8–23)
CO2: 20 mmol/L — ABNORMAL LOW (ref 22–32)
Calcium: 8.4 mg/dL — ABNORMAL LOW (ref 8.9–10.3)
Chloride: 109 mmol/L (ref 98–111)
Creatinine, Ser: 0.99 mg/dL (ref 0.61–1.24)
GFR, Estimated: >60 mL/min (ref >60)
Glucose, Bld: 87 mg/dL (ref 70–99)
Potassium: 3.5 mmol/L (ref 3.5–5.1)
Sodium: 139 mmol/L (ref 135–145)

## 2022-01-04 LAB — MAGNESIUM: Magnesium: 2.2 mg/dL (ref 1.7–2.4)

## 2022-01-04 MED ORDER — PANTOPRAZOLE SODIUM 40 MG PO TBEC: 40.0000 mg | DELAYED_RELEASE_TABLET | Freq: Every day | ORAL | 2 refills | Status: DC

## 2022-01-04 MED ORDER — METOCLOPRAMIDE HCL 10 MG PO TABS: 10.0000 mg | ORAL_TABLET | Freq: Three times a day (TID) | ORAL | 0 refills | Status: DC | PRN

## 2022-01-04 MED ORDER — AMLODIPINE BESYLATE 10 MG PO TABS
10.0000 mg | ORAL_TABLET | Freq: Every day | ORAL | Status: DC
  Administered 2022-01-04: 10 mg via ORAL
  Filled 2022-01-04: qty 1

## 2022-01-04 MED ORDER — AMLODIPINE BESYLATE 10 MG PO TABS: 10.0000 mg | ORAL_TABLET | Freq: Every day | ORAL | 2 refills | Status: DC

## 2022-01-04 NOTE — Discharge Summary (Signed)
Physician Discharge Summary  Darrell Hoffman. YKD:[id number] DOB: Aug 10, 1951 DOA: 01/01/2022  PCP: Biagio Borg, MD  Admit date: 01/01/2022 Discharge date: 01/04/2022  Admitted From: Home Disposition: Home  Recommendations for Outpatient Follow-up:  Follow up with PCP in 1-2 weeks Started on amlodipine 10 mg p.o. daily, follow-up blood pressures outpatient Reglan as needed for nausea/vomiting Continue discussion on THC cessation is likely etiology for his presenting symptoms/hospitalization  Home Health: No Equipment/Devices: None  Discharge Condition: Stable CODE STATUS: Full code Diet recommendation: Heart healthy diet  History of present illness:  Darrell Hoffman. is a 71 year old male with past medical history significant for idiopathic polyneuropathy, chronic pain syndrome, essential hypertension, chronic marijuana abuse who presented to [facility] ED on 1/17 with intractable nausea and vomiting.  Patient reports onset 3 days prior, no changes in diet or medication.  Attempted Pepto-Bismol without improvement of symptoms.  Denies abdominal pain.  Continues to endorse daily marijuana abuse.   In the ED, temperature 99.1 F, HR 105, RR 30, BP 139/95, SPO2 97% on room air.  Sodium 140, potassium 3.9, chloride 100, CO2 23, glucose 126, BUN 28, creatinine 1.16.  Lipase less than 10.  AST 17, ALT 12, total bilirubin 1.2.  BNP 538.3.  WBC 12.6, hemoglobin 13.7, platelets 331.  High sensitive troponin 83>70>79.  TSH 3.383.  COVID-19 PCR negative.  Influenza A/B PCR negative.  Urinalysis unrevealing.  Chest x-ray with no acute cardiopulmonary disease process.  CT abdomen/pelvis with contrast with no evidence of bowel obstruction or definite acute abdominal process, nonspecific distal esophageal and proximal gastric wall thickening, left renal cyst, mild prostamegaly, status post interval surgical repair of a large hiatal hernia.  Patient was given IV fluids and antiemetics by EDP with continued inability  to tolerate p.o.  Hospital service was consulted for further evaluation management of intractable nausea and vomiting.  Hospital course:  Intractable nausea/vomiting likely secondary to cannabinoid hyperemesis syndrome Patient presenting to ED with 3-day history of persistent nausea/vomiting, attempted Pepto-Bismol without effect.  Continues with daily cannabinoid abuse.  CT abdomen/pelvis with no acute intra-abdominal findings.  Patient was started on IV fluid hydration and diet slowly advanced with toleration.  Discussed cannabinoid cessation.  Discharged with Reglan as needed for nausea/vomiting.  Outpatient follow-up with PCP.   Hypokalemia: Resolved  Repleted during hospitalization.    Elevated troponin Elevated BNP High sensitive troponin elevated on admission,83>70>79; flat.  EKG with sinus tachycardia, rate 122, no concerning dynamic changes with no ST elevation/depression or T wave inversions.  Denies chest pain.  TTE with LVEF 55percent, no LV regional wall motion normalities, concentric LVH, trivial MR, no aortic stenosis.   Essential hypertension Patient not on antihypertensives outpatient.  Patient has been noted to have slight upward trend of his blood pressure during hospitalization.  Started on amlodipine 10 mg p.o. daily.  Outpatient follow-up PCP for further guidance/monitoring.   GERD: Protonix 40 mg p.o. daily   Tetrahydrocannabinol (THC) use disorder, moderate, dependence Likely contributing factor to his intractable nausea/vomiting.  Counseled on need for cessation.  Discharge Diagnoses:  Active Problems:   Hyperlipidemia   Morbid obesity (HCC)   GERD   Tetrahydrocannabinol (THC) use disorder, moderate, dependence ([place])   Neuropathy    Discharge Instructions  Discharge Instructions     Call MD for:  difficulty breathing, headache or visual disturbances   Complete by: As directed    Call MD for:  extreme fatigue   Complete by: As directed    Call  MD for:   persistant dizziness or light-headedness   Complete by: As directed    Call MD for:  persistant nausea and vomiting   Complete by: As directed    Call MD for:  severe uncontrolled pain   Complete by: As directed    Call MD for:  temperature >100.4   Complete by: As directed    Diet - low sodium heart healthy   Complete by: As directed    Increase activity slowly   Complete by: As directed       Allergies as of 01/04/2022   No Known Allergies      Medication List     STOP taking these medications    acetaminophen-codeine 300-30 MG tablet Commonly known as: TYLENOL #3   albuterol 108 (90 Base) MCG/ACT inhaler Commonly known as: Proventil HFA   Aspirin Low Dose 81 MG EC tablet Generic drug: aspirin   omeprazole 40 MG capsule Commonly known as: PRILOSEC   predniSONE 10 MG (21) Tbpk tablet Commonly known as: STERAPRED UNI-PAK 21 TAB   vitamin B-12 1000 MCG tablet Commonly known as: CYANOCOBALAMIN   Wixela Inhub 500-50 MCG/ACT Aepb Generic drug: fluticasone-salmeterol       TAKE these medications    amLODipine 10 MG tablet Commonly known as: NORVASC Take 1 tablet (10 mg total) by mouth daily. Start taking on: January 05, 2022   Cialis 20 MG tablet Generic drug: tadalafil TAKE 1 TABLET BY MOUTH DAILY AS NEEDED   fluticasone 50 MCG/ACT nasal spray Commonly known as: FLONASE Place 2 sprays into both nostrils daily.   gabapentin 600 MG tablet Commonly known as: Neurontin Take 1 tablet (600 mg total) by mouth 4 (four) times daily.   metoCLOPramide 10 MG tablet Commonly known as: REGLAN Take 1 tablet (10 mg total) by mouth every 8 (eight) hours as needed for nausea or vomiting.   pantoprazole 40 MG tablet Commonly known as: Protonix Take 1 tablet (40 mg total) by mouth daily.        Follow-up Information     Biagio Borg, MD. Schedule an appointment as soon as possible for a visit in 1 week(s).   Specialties: Internal Medicine, Radiology Contact  information: [place] [address] [phone]                No Known Allergies  Consultations: none   Procedures/Studies: DG Chest 2 View  Result Date: 01/01/2022 CLINICAL DATA:  Shortness of breath EXAM: CHEST - 2 VIEW COMPARISON:  2019 FINDINGS: Hiatal hernia is no longer present. Normal heart size. Lungs are clear. No pleural effusion. No pneumothorax. No acute osseous abnormality. IMPRESSION: No acute process in the chest. Electronically Signed   By: Macy Mis M.D.   On: 01/01/2022 14:43   CT Abdomen Pelvis W Contrast  Result Date: 01/01/2022 CLINICAL DATA:  Nausea for a few days without vomiting or diarrhea. Clinical concern for bowel obstruction. EXAM: CT ABDOMEN AND PELVIS WITH CONTRAST TECHNIQUE: Multidetector CT imaging of the abdomen and pelvis was performed using the standard protocol following bolus administration of intravenous contrast. RADIATION DOSE REDUCTION: This exam was performed according to the departmental dose-optimization program which includes automated exposure control, adjustment of the mA and/or kV according to patient size and/or use of iterative reconstruction technique. CONTRAST:  6mL OMNIPAQUE IOHEXOL 300 MG/ML  SOLN COMPARISON:  CTs of the chest, abdomen and pelvis 01/01/2021. Upper GI series 05/11/2021. FINDINGS: Lower chest: Interval surgical repair of previously demonstrated large  hiatal hernia. There is mild wall thickening of the distal esophagus. Interval improved aeration of both lung bases with mild residual lower lobe atelectasis or scarring bilaterally. No significant pleural or pericardial effusion. Hepatobiliary: The liver is normal in density without suspicious focal abnormality. No evidence of gallstones, gallbladder wall thickening or biliary dilatation. Pancreas: Unremarkable. No pancreatic ductal dilatation or surrounding inflammatory changes. Spleen: Normal in size without focal abnormality. Adrenals/Urinary  Tract: Both adrenal glands appear normal. There is a stable parapelvic cyst in the interpolar region of the left kidney which measures up to 3.2 cm. No evidence of renal mass, urinary tract calculus or hydronephrosis. The bladder appears unremarkable. Stomach/Bowel: As above, interval repair of the previously demonstrated large hiatal hernia. The stomach is below the diaphragm. There is mild nonspecific distal esophageal and proximal gastric wall thickening. No enteric contrast was administered. No evidence of bowel distension, wall thickening or surrounding inflammation. The appendix appears normal. Vascular/Lymphatic: There are no enlarged abdominal or pelvic lymph nodes. Mild aortic and branch vessel atherosclerosis. No acute vascular findings. Reproductive: Stable mild enlargement of the prostate gland. Other: Stable small supraumbilical hernia containing only fat. No herniated bowel. No ascites or free air. Musculoskeletal: No acute or significant osseous findings. Stable lumbar spondylosis. Scattered small sclerotic osseous lesions are unchanged, likely bone islands. IMPRESSION: 1. No evidence of bowel obstruction or definite acute abdominal process status post interval surgical repair of a large hiatal hernia. 2. Mild nonspecific distal esophageal and proximal gastric wall thickening. 3. Stable incidental findings including a left renal cyst, mild prostatomegaly and a small supraumbilical hernia containing only fat. 4. Aortic Atherosclerosis (ICD10-I70.0) and Emphysema (ICD10-J43.9). Electronically Signed   By: Richardean Sale M.D.   On: 01/01/2022 15:22   ECHOCARDIOGRAM COMPLETE  Result Date: 01/03/2022    ECHOCARDIOGRAM REPORT   Patient Name:   Darrell Hoffman. Date of Exam: 01/03/2022 Medical Rec #:  [id number]        Height:       66.0 in Accession #:    [id number]       Weight:       135.0 lb Date of Birth:  09-20-1951         BSA:          1.692 m Patient Age:    71 years         BP:           159/98  mmHg Patient Gender: M                HR:           89 bpm. Exam Location:  Inpatient Procedure: 2D Echo, Cardiac Doppler and Color Doppler Indications:    elev. troponin / acute chf  History:        Patient has no prior history of Echocardiogram examinations.                 Risk Factors:Hypertension. HLD.  Sonographer:    Beryle Beams Referring Phys: [id number] [place]  1. Left ventricular ejection fraction, by estimation, is 55 to 60%. The left ventricle has normal function. The left ventricle has no regional wall motion abnormalities. There is mild concentric left ventricular hypertrophy. Left ventricular diastolic parameters are consistent with Grade I diastolic dysfunction (impaired relaxation).  2. Right ventricular systolic function is normal. The right ventricular size is normal.  3. The mitral valve is normal in structure. Trivial mitral valve regurgitation. No evidence of mitral  stenosis.  4. The aortic valve is tricuspid. Aortic valve regurgitation is not visualized. No aortic stenosis is present. Comparison(s): No prior Echocardiogram. FINDINGS  Left Ventricle: Left ventricular ejection fraction, by estimation, is 55 to 60%. The left ventricle has normal function. The left ventricle has no regional wall motion abnormalities. The left ventricular internal cavity size was normal in size. There is  mild concentric left ventricular hypertrophy. Left ventricular diastolic parameters are consistent with Grade I diastolic dysfunction (impaired relaxation). Right Ventricle: IVC NOT SEEN. The right ventricular size is normal. Right ventricular systolic function is normal. Left Atrium: Left atrial size was normal in size. Right Atrium: Right atrial size was normal in size. Pericardium: There is no evidence of pericardial effusion. Mitral Valve: The mitral valve is normal in structure. Mild mitral annular calcification. Trivial mitral valve regurgitation. No evidence of mitral valve stenosis. MV  peak gradient, 119.2 mmHg. The mean mitral valve gradient is 76.0 mmHg. Tricuspid Valve: The tricuspid valve is normal in structure. Tricuspid valve regurgitation is trivial. No evidence of tricuspid stenosis. Aortic Valve: The aortic valve is tricuspid. Aortic valve regurgitation is not visualized. No aortic stenosis is present. Aortic valve mean gradient measures 2.0 mmHg. Aortic valve peak gradient measures 4.0 mmHg. Aortic valve area, by VTI measures 2.14 cm. Pulmonic Valve: The pulmonic valve was not well visualized. Pulmonic valve regurgitation is not visualized. No evidence of pulmonic stenosis. Aorta: The aortic root is normal in size and structure. Venous: The inferior vena cava was not well visualized. IAS/Shunts: The interatrial septum was not well visualized.  LEFT VENTRICLE PLAX 2D LVIDd:         3.80 cm     Diastology LVIDs:         2.10 cm     LV e' medial:    4.57 cm/s LV PW:         1.00 cm     LV E/e' medial:  11.0 LV IVS:        1.20 cm     LV e' lateral:   6.53 cm/s LVOT diam:     1.70 cm     LV E/e' lateral: 7.7 LV SV:         36 LV SV Index:   21 LVOT Area:     2.27 cm  LV Volumes (MOD) LV vol d, MOD A2C: 52.4 ml LV vol d, MOD A4C: 48.6 ml LV vol s, MOD A2C: 23.9 ml LV vol s, MOD A4C: 17.4 ml LV SV MOD A2C:     28.5 ml LV SV MOD A4C:     48.6 ml LV SV MOD BP:      29.9 ml RIGHT VENTRICLE RV S prime:     14.30 cm/s TAPSE (M-mode): 1.9 cm LEFT ATRIUM             Index        RIGHT ATRIUM          Index LA diam:        3.50 cm 2.07 cm/m   RA Area:     8.30 cm LA Vol (A2C):   49.2 ml 29.08 ml/m  RA Volume:   12.50 ml 7.39 ml/m LA Vol (A4C):   44.0 ml 26.00 ml/m LA Biplane Vol: 46.5 ml 27.48 ml/m  AORTIC VALVE AV Area (Vmax):    2.00 cm AV Area (Vmean):   1.79 cm AV Area (VTI):     2.14 cm AV Vmax:  100.00 cm/s AV Vmean:          67.100 cm/s AV VTI:            0.170 m AV Peak Grad:      4.0 mmHg AV Mean Grad:      2.0 mmHg LVOT Vmax:         87.90 cm/s LVOT Vmean:        52.900  cm/s LVOT VTI:          0.160 m LVOT/AV VTI ratio: 0.94  AORTA Ao Root diam: 2.40 cm Ao Asc diam:  2.10 cm MITRAL VALVE               TRICUSPID VALVE MV Area (PHT): 3.65 cm    TV Peak grad:   31.4 mmHg MV Area VTI:   0.19 cm    TV Mean grad:   21.0 mmHg MV Peak grad:  119.2 mmHg  TV Vmax:        2.80 m/s MV Mean grad:  76.0 mmHg   TV Vmean:       216.0 cm/s MV Vmax:       5.46 m/s    TV VTI:         0.89 msec MV Vmean:      406.0 cm/s  TR Peak grad:   31.8 mmHg MV Decel Time: 208 msec    TR Vmax:        282.00 cm/s MV E velocity: 50.10 cm/s MV A velocity: 61.70 cm/s  SHUNTS MV E/A ratio:  0.81        Systemic VTI:  0.16 m                            Systemic Diam: 1.70 cm Kirk Ruths MD Electronically signed by Kirk Ruths MD Signature Date/Time: 01/03/2022/2:32:26 PM    Final      Subjective: Patient seen examined at bedside, resting comfortably.  Tolerating advance diet.  No further nausea/vomiting.  Discharging home.  Discussed cannabinoid cessation.  Outpatient follow-up PCP.  No other questions or concerns at this time.  Denies headache, no dizziness, no chest pain, palpitations, no shortness of breath, no fever/chills/night sweats, no nausea/vomiting/diarrhea, no abdominal pain, no weakness, no fatigue, no paresthesias.  No acute events overnight per staff.  Discharge Exam: Vitals:   01/04/22 0629 01/04/22 0912  BP: (!) 158/107 (!) 151/94  Pulse: 68 80  Resp:  18  Temp:  99.1 F (37.3 C)  SpO2: 100% 98%   Vitals:   01/03/22 2111 01/04/22 0511 01/04/22 0629 01/04/22 0912  BP: (!) 166/95 (!) 167/111 (!) 158/107 (!) 151/94  Pulse: 78 71 68 80  Resp: 12 15  18   Temp: 99.2 F (37.3 C) 99.7 F (37.6 C)  99.1 F (37.3 C)  TempSrc: Oral Oral  Oral  SpO2: 100% 99% 100% 98%  Weight:      Height:        General: Pt is alert, awake, not in acute distress Cardiovascular: RRR, S1/S2 +, no rubs, no gallops Respiratory: CTA bilaterally, no wheezing, no rhonchi, on room air Abdominal:  Soft, NT, ND, bowel sounds + Extremities: no edema, no cyanosis    The results of significant diagnostics from this hospitalization (including imaging, microbiology, ancillary and laboratory) are listed below for reference.     Microbiology: Recent Results (from the past 240 hour(s))  Resp Panel by RT-PCR (Flu A&B, Covid) Nasopharyngeal Swab  Status: None   Collection Time: 01/01/22  3:28 PM   Specimen: Nasopharyngeal Swab; Nasopharyngeal(NP) swabs in vial transport medium  Result Value Ref Range Status   SARS Coronavirus 2 by RT PCR NEGATIVE NEGATIVE Final    Comment: (NOTE) SARS-CoV-2 target nucleic acids are NOT DETECTED.  The SARS-CoV-2 RNA is generally detectable in upper respiratory specimens during the acute phase of infection. The lowest concentration of SARS-CoV-2 viral copies this assay can detect is 138 copies/mL. A negative result does not preclude SARS-Cov-2 infection and should not be used as the sole basis for treatment or other patient management decisions. A negative result may occur with  improper specimen collection/handling, submission of specimen other than nasopharyngeal swab, presence of viral mutation(s) within the areas targeted by this assay, and inadequate number of viral copies(<138 copies/mL). A negative result must be combined with clinical observations, patient history, and epidemiological information. The expected result is Negative.  Fact Sheet for Patients:  EntrepreneurPulse.com.au  Fact Sheet for Healthcare Providers:  IncredibleEmployment.be  This test is no t yet approved or cleared by the [place] FDA and  has been authorized for detection and/or diagnosis of SARS-CoV-2 by FDA under an Emergency Use Authorization (EUA). This EUA will remain  in effect (meaning this test can be used) for the duration of the COVID-19 declaration under Section 564(b)(1) of the Act, 21 U.S.C.section 360bbb-3(b)(1),  unless the authorization is terminated  or revoked sooner.       Influenza A by PCR NEGATIVE NEGATIVE Final   Influenza B by PCR NEGATIVE NEGATIVE Final    Comment: (NOTE) The Xpert Xpress SARS-CoV-2/FLU/RSV plus assay is intended as an aid in the diagnosis of influenza from Nasopharyngeal swab specimens and should not be used as a sole basis for treatment. Nasal washings and aspirates are unacceptable for Xpert Xpress SARS-CoV-2/FLU/RSV testing.  Fact Sheet for Patients: EntrepreneurPulse.com.au  Fact Sheet for Healthcare Providers: IncredibleEmployment.be  This test is not yet approved or cleared by the [place] FDA and has been authorized for detection and/or diagnosis of SARS-CoV-2 by FDA under an Emergency Use Authorization (EUA). This EUA will remain in effect (meaning this test can be used) for the duration of the COVID-19 declaration under Section 564(b)(1) of the Act, 21 U.S.C. section 360bbb-3(b)(1), unless the authorization is terminated or revoked.  Performed at [facility], [address], [place], [place] [address]      Labs: BNP (last 3 results) Recent Labs    01/01/22 1153  BNP 397.6*   Basic Metabolic Panel: Recent Labs  Lab 01/01/22 1153 01/02/22 0450 01/02/22 1001 01/03/22 0341 01/04/22 0546  NA 140 139  --  140 139  K 3.9 3.3*  --  3.4* 3.5  CL 100 104  --  109 109  CO2 23 21*  --  19* 20*  GLUCOSE 126* 100*  --  85 87  BUN 28* 26*  --  26* 21  CREATININE 1.16 1.13 1.14 0.98 0.99  CALCIUM 10.8* 9.5  --  8.8* 8.4*  MG 2.2  --  2.3  --  2.2  PHOS 3.1  --   --   --   --    Liver Function Tests: Recent Labs  Lab 01/01/22 1153 01/02/22 0450 01/03/22 0341  AST 17 19 18   ALT 12 10 13   ALKPHOS 44 40 37*  BILITOT 1.2 1.3* 1.2  PROT 8.1 7.3 6.7  ALBUMIN 4.7 4.3 4.0   Recent Labs  Lab 01/01/22 1153  LIPASE <  10*   No results for input(s): AMMONIA in the last 168  hours. CBC: Recent Labs  Lab 01/01/22 1153 01/02/22 0450 01/02/22 1001 01/03/22 0341  WBC 12.6* 14.0* 14.0* 10.9*  NEUTROABS  --  10.9*  --   --   HGB 13.7 13.4 13.3 11.9*  HCT 42.1 40.6 41.5 36.4*  MCV 81.3 82.2 85.7 85.2  PLT 331 285 329 230   Cardiac Enzymes: No results for input(s): CKTOTAL, CKMB, CKMBINDEX, TROPONINI in the last 168 hours. BNP: Invalid input(s): POCBNP CBG: No results for input(s): GLUCAP in the last 168 hours. D-Dimer No results for input(s): DDIMER in the last 72 hours. Hgb A1c No results for input(s): HGBA1C in the last 72 hours. Lipid Profile No results for input(s): CHOL, HDL, LDLCALC, TRIG, CHOLHDL, LDLDIRECT in the last 72 hours. Thyroid function studies Recent Labs    01/01/22 1613  TSH 3.383   Anemia work up No results for input(s): VITAMINB12, FOLATE, FERRITIN, TIBC, IRON, RETICCTPCT in the last 72 hours. Urinalysis    Component Value Date/Time   COLORURINE YELLOW 01/02/2022 0619   APPEARANCEUR CLEAR 01/02/2022 0619   LABSPEC 1.042 (H) 01/02/2022 0619   PHURINE 5.5 01/02/2022 0619   GLUCOSEU NEGATIVE 01/02/2022 0619   GLUCOSEU NEGATIVE 12/11/2020 1342   HGBUR TRACE (A) 01/02/2022 0619   BILIRUBINUR NEGATIVE 01/02/2022 0619   KETONESUR TRACE (A) 01/02/2022 0619   PROTEINUR 30 (A) 01/02/2022 0619   UROBILINOGEN 0.2 12/11/2020 1342   NITRITE NEGATIVE 01/02/2022 0619   LEUKOCYTESUR NEGATIVE 01/02/2022 0619   Sepsis Labs Invalid input(s): PROCALCITONIN,  WBC,  LACTICIDVEN Microbiology Recent Results (from the past 240 hour(s))  Resp Panel by RT-PCR (Flu A&B, Covid) Nasopharyngeal Swab     Status: None   Collection Time: 01/01/22  3:28 PM   Specimen: Nasopharyngeal Swab; Nasopharyngeal(NP) swabs in vial transport medium  Result Value Ref Range Status   SARS Coronavirus 2 by RT PCR NEGATIVE NEGATIVE Final    Comment: (NOTE) SARS-CoV-2 target nucleic acids are NOT DETECTED.  The SARS-CoV-2 RNA is generally detectable in upper  respiratory specimens during the acute phase of infection. The lowest concentration of SARS-CoV-2 viral copies this assay can detect is 138 copies/mL. A negative result does not preclude SARS-Cov-2 infection and should not be used as the sole basis for treatment or other patient management decisions. A negative result may occur with  improper specimen collection/handling, submission of specimen other than nasopharyngeal swab, presence of viral mutation(s) within the areas targeted by this assay, and inadequate number of viral copies(<138 copies/mL). A negative result must be combined with clinical observations, patient history, and epidemiological information. The expected result is Negative.  Fact Sheet for Patients:  EntrepreneurPulse.com.au  Fact Sheet for Healthcare Providers:  IncredibleEmployment.be  This test is no t yet approved or cleared by the [place] FDA and  has been authorized for detection and/or diagnosis of SARS-CoV-2 by FDA under an Emergency Use Authorization (EUA). This EUA will remain  in effect (meaning this test can be used) for the duration of the COVID-19 declaration under Section 564(b)(1) of the Act, 21 U.S.C.section 360bbb-3(b)(1), unless the authorization is terminated  or revoked sooner.       Influenza A by PCR NEGATIVE NEGATIVE Final   Influenza B by PCR NEGATIVE NEGATIVE Final    Comment: (NOTE) The Xpert Xpress SARS-CoV-2/FLU/RSV plus assay is intended as an aid in the diagnosis of influenza from Nasopharyngeal swab specimens and should not be used as a sole basis for  treatment. Nasal washings and aspirates are unacceptable for Xpert Xpress SARS-CoV-2/FLU/RSV testing.  Fact Sheet for Patients: EntrepreneurPulse.com.au  Fact Sheet for Healthcare Providers: IncredibleEmployment.be  This test is not yet approved or cleared by the [place] FDA and has been  authorized for detection and/or diagnosis of SARS-CoV-2 by FDA under an Emergency Use Authorization (EUA). This EUA will remain in effect (meaning this test can be used) for the duration of the COVID-19 declaration under Section 564(b)(1) of the Act, 21 U.S.C. section 360bbb-3(b)(1), unless the authorization is terminated or revoked.  Performed at [facility], [address], [place], [place] [address]      Time coordinating discharge: Over 30 minutes  SIGNED:   Donnamarie Poag [place] DO  [facility] 01/04/2022, 12:58 PM

## 2022-01-04 NOTE — Progress Notes (Signed)
Discharge instructions given. Patient verbalized understanding and all questions were answered.  ?

## 2022-01-08 ENCOUNTER — Encounter: Payer: Federal, State, Local not specified - PPO | Admitting: Physical Therapy

## 2022-01-10 ENCOUNTER — Encounter: Payer: Federal, State, Local not specified - PPO | Admitting: Rehabilitative and Restorative Service Providers"

## 2022-01-15 ENCOUNTER — Encounter: Payer: Federal, State, Local not specified - PPO | Admitting: Rehabilitative and Restorative Service Providers"

## 2022-01-17 ENCOUNTER — Encounter: Payer: Federal, State, Local not specified - PPO | Admitting: Rehabilitative and Restorative Service Providers"

## 2022-02-04 ENCOUNTER — Encounter: Payer: Self-pay | Admitting: Internal Medicine

## 2022-02-04 ENCOUNTER — Ambulatory Visit: Payer: Federal, State, Local not specified - PPO | Admitting: Internal Medicine

## 2022-02-04 ENCOUNTER — Other Ambulatory Visit: Payer: Self-pay

## 2022-02-04 VITALS — BP 110/70 | HR 86 | Temp 98.4°F | Ht 66.0 in | Wt 140.8 lb

## 2022-02-04 DIAGNOSIS — R634 Abnormal weight loss: Secondary | ICD-10-CM

## 2022-02-04 DIAGNOSIS — R112 Nausea with vomiting, unspecified: Secondary | ICD-10-CM

## 2022-02-04 DIAGNOSIS — E785 Hyperlipidemia, unspecified: Secondary | ICD-10-CM | POA: Diagnosis not present

## 2022-02-04 DIAGNOSIS — E559 Vitamin D deficiency, unspecified: Secondary | ICD-10-CM | POA: Diagnosis not present

## 2022-02-04 DIAGNOSIS — F17200 Nicotine dependence, unspecified, uncomplicated: Secondary | ICD-10-CM

## 2022-02-04 DIAGNOSIS — Z125 Encounter for screening for malignant neoplasm of prostate: Secondary | ICD-10-CM

## 2022-02-04 DIAGNOSIS — Z0001 Encounter for general adult medical examination with abnormal findings: Secondary | ICD-10-CM

## 2022-02-04 DIAGNOSIS — R739 Hyperglycemia, unspecified: Secondary | ICD-10-CM

## 2022-02-04 DIAGNOSIS — E538 Deficiency of other specified B group vitamins: Secondary | ICD-10-CM

## 2022-02-04 LAB — CBC WITH DIFFERENTIAL/PLATELET
Basophils Absolute: 0 10*3/uL (ref 0.0–0.1)
Basophils Relative: 0.1 % (ref 0.0–3.0)
Eosinophils Absolute: 0 10*3/uL (ref 0.0–0.7)
Eosinophils Relative: 0.3 % (ref 0.0–5.0)
HCT: 38.7 % — ABNORMAL LOW (ref 39.0–52.0)
Hemoglobin: 12.5 g/dL — ABNORMAL LOW (ref 13.0–17.0)
Lymphocytes Relative: 36.3 % (ref 12.0–46.0)
Lymphs Abs: 2.9 10*3/uL (ref 0.7–4.0)
MCHC: 32.4 g/dL (ref 30.0–36.0)
MCV: 82.1 fl (ref 78.0–100.0)
Monocytes Absolute: 0.6 10*3/uL (ref 0.1–1.0)
Monocytes Relative: 6.9 % (ref 3.0–12.0)
Neutro Abs: 4.6 10*3/uL (ref 1.4–7.7)
Neutrophils Relative %: 56.4 % (ref 43.0–77.0)
Platelets: 433 10*3/uL — ABNORMAL HIGH (ref 150.0–400.0)
RBC: 4.72 Mil/uL (ref 4.22–5.81)
RDW: 17.2 % — ABNORMAL HIGH (ref 11.5–15.5)
WBC: 8.1 10*3/uL (ref 4.0–10.5)

## 2022-02-04 LAB — BASIC METABOLIC PANEL
BUN: 17 mg/dL (ref 6–23)
CO2: 31 mEq/L (ref 19–32)
Calcium: 9.2 mg/dL (ref 8.4–10.5)
Chloride: 94 mEq/L — ABNORMAL LOW (ref 96–112)
Creatinine, Ser: 1.07 mg/dL (ref 0.40–1.50)
GFR: 70.79 mL/min (ref >60.00)
Glucose, Bld: 98 mg/dL (ref 70–99)
Potassium: 4.1 mEq/L (ref 3.5–5.1)
Sodium: 129 mEq/L — ABNORMAL LOW (ref 135–145)

## 2022-02-04 LAB — HEPATIC FUNCTION PANEL
ALT: 11 U/L (ref 0–53)
AST: 15 U/L (ref 0–37)
Albumin: 3.9 g/dL (ref 3.5–5.2)
Alkaline Phosphatase: 48 U/L (ref 39–117)
Bilirubin, Direct: 0.1 mg/dL (ref 0.0–0.3)
Total Bilirubin: 0.9 mg/dL (ref 0.2–1.2)
Total Protein: 6.4 g/dL (ref 6.0–8.3)

## 2022-02-04 LAB — LIPID PANEL
Cholesterol: 190 mg/dL (ref 0–200)
HDL: 47.5 mg/dL (ref >39.00)
LDL Cholesterol: 111 mg/dL — ABNORMAL HIGH (ref 0–99)
NonHDL: 142.16
Total CHOL/HDL Ratio: 4
Triglycerides: 155 mg/dL — ABNORMAL HIGH (ref 0.0–149.0)
VLDL: 31 mg/dL (ref 0.0–40.0)

## 2022-02-04 LAB — TSH: TSH: 2.97 u[IU]/mL (ref 0.35–5.50)

## 2022-02-04 LAB — PSA: PSA: 0.32 ng/mL (ref 0.10–4.00)

## 2022-02-04 LAB — VITAMIN D 25 HYDROXY (VIT D DEFICIENCY, FRACTURES): VITD: 15.83 ng/mL — ABNORMAL LOW (ref 30.00–100.00)

## 2022-02-04 LAB — VITAMIN B12: Vitamin B-12: 663 pg/mL (ref 211–911)

## 2022-02-04 MED ORDER — ONDANSETRON HCL 4 MG PO TABS: 4.0000 mg | ORAL_TABLET | Freq: Three times a day (TID) | ORAL | 2 refills | Status: DC | PRN

## 2022-02-04 NOTE — Progress Notes (Signed)
Patient ID: Darrell Amedee., male   DOB: 27-Mar-1951, 71 y.o.   MRN: [id number]         Chief Complaint:: wellness exam and Office Visit (Vomiting starting 01/27/22)  ,Elevated BP, low potassium       HPI:  Darrell Genova. is a 71 y.o. male here for wellness exam; declines covid booster, flu shot, shingirx, pneumovax, tdap, o/w up to date                        Also pt state has had No THC in the past month, still with persistent problem n/v post hospn.  Can keep down most fluids however.  Denies worsening reflux, abd pain, dysphagia, bowel change or blood.  Last EGD sept 2021.  Not seen by GI or Gen Surgury since:   Peak wt has been about 260 , last just over 1 yr ago, prior to esophageal hiata hernia surgury   Due for low K recheck.  Did not take amlodipine after recent hospn.  Did not take reglan as well.  Pt denies chest pain, increased sob or doe, wheezing, orthopnea, PND, increased LE swelling, palpitations, dizziness or syncope.   Pt denies polydipsia, polyuria, or new focal neuro s/s.    Pt denies fever, night sweats, loss of appetite, or other constitutional symptomsl but has ost significant wt loss overall since last yr. Denies worsening depressive symptoms, suicidal ideation, or panic Wt Readings from Last 3 Encounters:  02/04/22 140 lb 12.8 oz (63.9 kg)  01/01/22 135 lb (61.2 kg)  12/04/21 145 lb 3.2 oz (65.9 kg)   BP Readings from Last 3 Encounters:  02/04/22 110/70  01/04/22 128/86  12/04/21 109/71   Immunization History  Administered Date(s) Administered   Fluad Quad(high Dose 65+) 11/04/2019   Influenza Whole 10/06/2008, 11/27/2009   Influenza, High Dose Seasonal PF 09/25/2017, 09/29/2018   Influenza,inj,Quad PF,6+ Mos 01/10/2016   Influenza-Unspecified 10/08/2020   Moderna Sars-Covid-2 Vaccination 02/15/2020, 03/14/2020   PFIZER SARS-COV-2 Pediatric Vaccination 5-67yrs 10/08/2020   Pneumococcal Conjugate-13 03/30/2018   Td 03/11/2000   Tdap 04/02/2011   There are no  preventive care reminders to display for this patient.     Past Medical History:  Diagnosis Date   ALCOHOL ABUSE, HX OF 10/03/2007   ALLERGIC RHINITIS 10/03/2007   ANEMIA-B12 DEFICIENCY 12/04/2009   ANEMIA-IRON DEFICIENCY 11/27/2009   ANEMIA-NOS 10/08/2007   ASTHMA 10/03/2007   Asthma    B12 DEFICIENCY 10/06/2008   CKD (chronic kidney disease) stage 3, GFR 30-59 ml/min (HCC) 03/31/2018   COLONIC POLYPS, HX OF 10/03/2007   ERECTILE DYSFUNCTION 10/03/2007   GERD 10/03/2007   History of hiatal hernia    found on CXT in June 2021   HYPERLIPIDEMIA 10/03/2007   HYPERTENSION 10/03/2007   KNEE PAIN, RIGHT 10/08/2007   Morbid obesity ([place]) 10/03/2007   Neuromuscular disorder ([place])    per pt. never diagnosed   Substance abuse ([place])    marijuana daily   Past Surgical History:  Procedure Laterality Date   bilateral hydrocele repair     COLONOSCOPY  06/06/2016   POLYPECTOMY  01-03-2010   s/p left knee arthroscopy  2009   s/p TKR  03/2009   VASECTOMY     XI ROBOTIC ASSISTED HIATAL HERNIA REPAIR N/A 01/04/2021   Procedure: XI ROBOTIC ASSISTED HIATAL HERNIA REPAIR AND FUNDOPLICATION WITH MESH;  Surgeon: Ralene Ok, MD;  Location: [facility];  Service: General;  Laterality: N/A;  reports that he has been smoking. He has never used smokeless tobacco. He reports that he does not currently use alcohol. He reports current drug use. Drug: Marijuana. family history includes Alcohol abuse in his paternal uncle; Breast cancer in his mother; Coronary artery disease in an other family member; Diabetes in his cousin and sister; Hypertension in his sister; Lupus in his sister. No Known Allergies Current Outpatient Medications on File Prior to Visit  Medication Sig Dispense Refill   CIALIS 20 MG tablet TAKE 1 TABLET BY MOUTH DAILY AS NEEDED 5 tablet 11   fluticasone (FLONASE) 50 MCG/ACT nasal spray Place 2 sprays into both nostrils daily. 16 g 5   gabapentin (NEURONTIN) 600 MG tablet Take 1 tablet  (600 mg total) by mouth 4 (four) times daily. 120 tablet 5   pantoprazole (PROTONIX) 40 MG tablet Take 1 tablet (40 mg total) by mouth daily. 30 tablet 2   metoCLOPramide (REGLAN) 10 MG tablet Take 1 tablet (10 mg total) by mouth every 8 (eight) hours as needed for nausea or vomiting. 30 tablet 0   No current facility-administered medications on file prior to visit.        ROS:  All others reviewed and negative.  Objective        PE:  BP 110/70 (BP Location: Left Arm, Patient Position: Sitting, Cuff Size: Large)    Pulse 86    Temp 98.4 F (36.9 C) (Oral)    Ht 5\' 6"  (1.676 m)    Wt 140 lb 12.8 oz (63.9 kg)    SpO2 100%    BMI 22.73 kg/m                 Constitutional: Pt appears in NAD               HENT: Head: NCAT.                Right Ear: External ear normal.                 Left Ear: External ear normal.                Eyes: . Pupils are equal, round, and reactive to light. Conjunctivae and EOM are normal               Nose: without d/c or deformity               Neck: Neck supple. Gross normal ROM               Cardiovascular: Normal rate and regular rhythm.                 Pulmonary/Chest: Effort normal and breath sounds without rales or wheezing.                Abd:  Soft, NT, ND, + BS, no organomegaly               Neurological: Pt is alert. At baseline orientation, motor grossly intact               Skin: Skin is warm. No rashes, no other new lesions, LE edema - none               Psychiatric: Pt behavior is normal without agitation   Micro: none  Cardiac tracings I have personally interpreted today:  none  Pertinent Radiological findings (summarize): none   Lab Results  Component Value Date   WBC 8.1 02/04/2022  HGB 12.5 (L) 02/04/2022   HCT 38.7 (L) 02/04/2022   PLT 433.0 (H) 02/04/2022   GLUCOSE 98 02/04/2022   CHOL 190 02/04/2022   TRIG 155.0 (H) 02/04/2022   HDL 47.50 02/04/2022   LDLDIRECT 71.0 06/07/2020   LDLCALC 111 (H) 02/04/2022   ALT 11 02/04/2022    AST 15 02/04/2022   NA 129 (L) 02/04/2022   K 4.1 02/04/2022   CL 94 (L) 02/04/2022   CREATININE 1.07 02/04/2022   BUN 17 02/04/2022   CO2 31 02/04/2022   TSH 2.97 02/04/2022   PSA 0.32 02/04/2022   INR 2.5 (H) 04/23/2009   HGBA1C 5.7 12/11/2020   Assessment/Plan:  Darrell Mcconaghy. is a 71 y.o. Black or African American [2] male with  has a past medical history of ALCOHOL ABUSE, HX OF (10/03/2007), ALLERGIC RHINITIS (10/03/2007), ANEMIA-B12 DEFICIENCY (12/04/2009), ANEMIA-IRON DEFICIENCY (11/27/2009), ANEMIA-NOS (10/08/2007), ASTHMA (10/03/2007), Asthma, B12 DEFICIENCY (10/06/2008), CKD (chronic kidney disease) stage 3, GFR 30-59 ml/min (HCC) (03/31/2018), COLONIC POLYPS, HX OF (10/03/2007), ERECTILE DYSFUNCTION (10/03/2007), GERD (10/03/2007), History of hiatal hernia, HYPERLIPIDEMIA (10/03/2007), HYPERTENSION (10/03/2007), KNEE PAIN, RIGHT (10/08/2007), Morbid obesity ([place]) (10/03/2007), Neuromuscular disorder ([place]), and Substance abuse ([place]).  Encounter for well adult exam with abnormal findings Age and sex appropriate education and counseling updated with regular exercise and diet Referrals for preventative services - none needed Immunizations addressed - declines covid booster, flu shot, shingrix, tdap Smoking counseling  - counsled to quit, pt not ready Evidence for depression or other mood disorder - none significant Most recent labs reviewed. I have personally reviewed and have noted: 1) the patient's medical and social history 2) The patient's current medications and supplements 3) The patient's height, weight, and BMI have been recorded in the chart   Smoker Pt counseled to quit, pt not ready  Nausea and vomiting Etiology unclear, for zofran prn, refer GI   B12 deficiency Lab Results  Component Value Date   VITAMINB12 663 02/04/2022   Stable, cont oral replacement - b12 1000 mcg qd   Hyperglycemia Lab Results  Component Value Date   HGBA1C 5.7 12/11/2020    Stable, pt to continue current medical treatment  - diet   Hyperlipidemia Lab Results  Component Value Date   LDLCALC 111 (H) 02/04/2022   Uncontrolled, pt to continue current low chol diet, declines statin  Vitamin D deficiency Last vitamin D Lab Results  Component Value Date   VD25OH 15.83 (L) 02/04/2022   Low, to start oral replacement   Weight loss For restart boost bid, f/u GI as referred  Followup: Return in about 4 weeks (around 03/04/2022).  Cathlean Cower, MD 02/10/2022 7:40 PM [facility]

## 2022-02-04 NOTE — Patient Instructions (Signed)
Please take all new medication as prescribed  - the nausea medication  Please continue all other medications as before, and refills have been done if requested.  Please have the pharmacy call with any other refills you may need.  Please continue your efforts at being more active, low cholesterol diet, and weight control.  You are otherwise up to date with prevention measures today.  Please keep your appointments with your specialists as you may have planned  You will be contacted regarding the referral for: Gastroenterology (urgent Dr Fuller Plan)  Please go to the LAB at the blood drawing area for the tests to be done  You will be contacted by phone if any changes need to be made immediately.  Otherwise, you will receive a letter about your results with an explanation, but please check with MyChart first.  Please remember to sign up for MyChart if you have not done so, as this will be important to you in the future with finding out test results, communicating by private email, and scheduling acute appointments online when needed.  Please make an Appointment to return in 1 months, or sooner if needed

## 2022-02-05 LAB — URINALYSIS, ROUTINE W REFLEX MICROSCOPIC
Bilirubin Urine: NEGATIVE
Hgb urine dipstick: NEGATIVE
Ketones, ur: NEGATIVE
Leukocytes,Ua: NEGATIVE
Nitrite: NEGATIVE
RBC / HPF: NONE SEEN (ref 0–?)
Specific Gravity, Urine: 1.02 (ref 1.000–1.030)
Total Protein, Urine: NEGATIVE
Urine Glucose: NEGATIVE
Urobilinogen, UA: 0.2 (ref 0.0–1.0)
pH: 6 (ref 5.0–8.0)

## 2022-02-06 ENCOUNTER — Telehealth: Payer: Self-pay

## 2022-02-06 NOTE — Telephone Encounter (Signed)
Pt is requesting a return to work note with limitation.   Pt has been out of work 01/25/22 due to Deterioration of the body.  Pt CB [phone]

## 2022-02-06 NOTE — Telephone Encounter (Signed)
Left message for patient to call me back as I am unsure of the deterioration of the body means for him.

## 2022-02-07 NOTE — Telephone Encounter (Signed)
Pt is states that he was experiencing severe nausea. He was unable to keep things down. He was admitted to the hos for 3 days for this issue.  Pt CB [phone]

## 2022-02-07 NOTE — Telephone Encounter (Signed)
I am not sure what he means by "with limitations".  Does this mean light duty only for some period of time?  Or something else specifically?

## 2022-02-08 NOTE — Telephone Encounter (Signed)
Pt is requesting a weight limitation of no more than 30lbs. Pt is requesting to work 4-6 hrs max a day. Pt is unsure of how long he will need these limitations as he is trying to regain strength for the weight loss and lost of muscle definition.   Pt has a follow on 3/28.  Pt CB [phone]

## 2022-02-08 NOTE — Telephone Encounter (Signed)
[place] letter done

## 2022-02-08 NOTE — Telephone Encounter (Signed)
Pt states a, Otho Najjar will pick the letter up, informed pt alternate person will need to present their ID

## 2022-02-10 ENCOUNTER — Encounter: Payer: Self-pay | Admitting: Internal Medicine

## 2022-02-10 DIAGNOSIS — F17200 Nicotine dependence, unspecified, uncomplicated: Secondary | ICD-10-CM | POA: Insufficient documentation

## 2022-02-10 NOTE — Assessment & Plan Note (Signed)
For restart boost bid, f/u GI as referred

## 2022-02-10 NOTE — Assessment & Plan Note (Signed)
Lab Results  Component Value Date   [id number] 786 02/04/2022   Stable, cont oral replacement - b12 1000 mcg qd

## 2022-02-10 NOTE — Assessment & Plan Note (Signed)
Lab Results  Component Value Date   LDLCALC 111 (H) 02/04/2022   Uncontrolled, pt to continue current low chol diet, declines statin

## 2022-02-10 NOTE — Assessment & Plan Note (Signed)
Age and sex appropriate education and counseling updated with regular exercise and diet Referrals for preventative services - none needed Immunizations addressed - declines covid booster, flu shot, shingrix, tdap Smoking counseling  - counsled to quit, pt not ready Evidence for depression or other mood disorder - none significant Most recent labs reviewed. I have personally reviewed and have noted: 1) the patient's medical and social history 2) The patient's current medications and supplements 3) The patient's height, weight, and BMI have been recorded in the chart

## 2022-02-10 NOTE — Assessment & Plan Note (Signed)
Etiology unclear, for zofran prn, refer GI

## 2022-02-10 NOTE — Assessment & Plan Note (Signed)
Lab Results  Component Value Date   HGBA1C 5.7 12/11/2020   Stable, pt to continue current medical treatment  - diet

## 2022-02-10 NOTE — Assessment & Plan Note (Signed)
Last vitamin D Lab Results  Component Value Date   VD25OH 15.83 (L) 02/04/2022   Low, to start oral replacement

## 2022-02-10 NOTE — Assessment & Plan Note (Signed)
Pt counseled to quit, pt not ready 

## 2022-02-19 ENCOUNTER — Encounter: Payer: Self-pay | Admitting: Physician Assistant

## 2022-02-19 ENCOUNTER — Ambulatory Visit: Payer: Federal, State, Local not specified - PPO | Admitting: Physician Assistant

## 2022-02-19 VITALS — BP 154/74 | HR 92 | Ht 66.0 in | Wt 156.2 lb

## 2022-02-19 DIAGNOSIS — R112 Nausea with vomiting, unspecified: Secondary | ICD-10-CM

## 2022-02-19 NOTE — Patient Instructions (Addendum)
We have sent the following medications to your pharmacy for you to pick up at your convenience: ?Restart Pantoprazole 40 mg daily before breakfast.  ?Zofran 4 mg every 6 hours as needed. ? ?Decrease Marijuana use.  ? ?Follow up with Dr. Tomi Likens ASAP. ? ?If you are age 70 or older, your body mass index should be between 23-30. Your Body mass index is 25.21 kg/m?Marland Kitchen If this is out of the aforementioned range listed, please consider follow up with your Primary Care Provider. ? ?If you are age 10 or younger, your body mass index should be between 19-25. Your Body mass index is 25.21 kg/m?Marland Kitchen If this is out of the aformentioned range listed, please consider follow up with your Primary Care Provider.  ? ?________________________________________________________ ? ?The [facility] providers would like to encourage you to use [facility] to communicate with providers for non-urgent requests or questions.  Due to long hold times on the telephone, sending your provider a message by [facility] may be a faster and more efficient way to get a response.  Please allow 48 business hours for a response.  Please remember that this is for non-urgent requests.  ?_______________________________________________________ ? ?

## 2022-02-19 NOTE — Progress Notes (Signed)
Subjective:    Patient ID: Darrell Roup., male    DOB: 07-Dec-1951, 71 y.o.   MRN: [id number]  HPI Darrell Hoffman is a 71 year old, African-American male, established with Dr. Russella Dar, who was last seen here in 2021.  He comes in today after recent hospitalization January 2023 with persistent nausea and vomiting, for follow-up on the advice of his PCP/Dr. Jonny Ruiz. Patient had undergone EGD in September 2021 and was found to have a very large hiatal hernia, with intrathoracic stomach, a few benign gastric polyps and otherwise negative exam.  He was referred to surgery and underwent robotic assisted hiatal hernia repair on 01/04/2021. Also with history of adenomatous colon polyps, last colonoscopy July 2020 with removal of 2 polyps each 6 to 7 mm which were tubular adenomas also noted to have a few diverticuli and internal hemorrhoids.  He is indicated for 5-year interval follow-up. Other medical problems include chronic kidney disease stage III, chronic pain syndrome, polyneuropathy, prior history of EtOH abuse, and history of chronic daily marijuana use. When he was hospitalized in January he underwent CT of the abdomen and pelvis with contrast which showed interval repair of the hiatal hernia, mild wall thickening of the distal esophagus, normal gallbladder, normal liver, stable left renal cyst and a small supraumbilical hernia. He had been sick for about 3 days prior to hospitalization with intractable nausea and vomiting.  It was felt this was likely secondary to cannabis hyperemesis syndrome.  He was managed with IV fluids, IV Reglan and Phenergan, and was discharged when he was able to take p.o.'s.  Patient says that he had sustained a large weight loss around the time of his hiatal hernia surgery and prior to that time as he was unable to eat well.  He says his weight has bounced up and down a little bit recently but has been overall stable. Since discharge from the hospital he says that he has consistently  been able to eat, and has not had any nausea or vomiting though he says he is not particularly hungry.  Bowel movements have been okay, no melena or hematochezia.  He denies any heartburn or indigestion, no dysphagia or odynophagia.  Denies any abdominal pain. He is struggling a lot with mobility issues, significant difficulty with his right leg and says that he has absolutely "no feeling".  He is waiting to be seen again by neurology. Patient is concerned that he may have had a stroke and wants further evaluation for that.  He says that he has had significant worsening in his mobility over the past 6 months and wonders if he has something going on besides the polyneuropathy.  On further discussion he does admit to ongoing daily cannabis use, says that he has decreased amount somewhat.  He has been using marijuana daily for many years.  No regular NSAID use.  Review of Systems Pertinent positive and negative review of systems were noted in the above HPI section.  All other review of systems was otherwise negative.   Outpatient Encounter Medications as of 02/19/2022  Medication Sig   CIALIS 20 MG tablet TAKE 1 TABLET BY MOUTH DAILY AS NEEDED   gabapentin (NEURONTIN) 600 MG tablet Take 1 tablet (600 mg total) by mouth 4 (four) times daily.   [DISCONTINUED] fluticasone (FLONASE) 50 MCG/ACT nasal spray Place 2 sprays into both nostrils daily.   [DISCONTINUED] metoCLOPramide (REGLAN) 10 MG tablet Take 1 tablet (10 mg total) by mouth every 8 (eight) hours as needed for nausea  or vomiting.   [DISCONTINUED] ondansetron (ZOFRAN) 4 MG tablet Take 1 tablet (4 mg total) by mouth every 8 (eight) hours as needed for nausea or vomiting.   [DISCONTINUED] pantoprazole (PROTONIX) 40 MG tablet Take 1 tablet (40 mg total) by mouth daily.   No facility-administered encounter medications on file as of 02/19/2022.   No Known Allergies Patient Active Problem List   Diagnosis Date Noted   Smoker 02/10/2022   Nausea and  vomiting 01/01/2022   Neuropathy 05/29/2021   Malnutrition of moderate degree 01/05/2021   Gastric outlet obstruction 01/01/2021   Hiatal hernia with obstruction but no gangrene 01/01/2021   Preoperative cardiovascular examination 11/21/2020   Bilateral leg pain 08/23/2020   Dysphagia 08/09/2020   Tetrahydrocannabinol (THC) use disorder, moderate, dependence (HCC) 08/09/2020   Weight loss 06/07/2020   Vitamin D deficiency 06/07/2020   Cough 02/10/2019   Subacromial bursitis of left shoulder joint 01/19/2019   CKD (chronic kidney disease) stage 3, GFR 30-59 ml/min (HCC) 03/31/2018   Hyperglycemia 09/25/2017   Daytime somnolence 09/25/2017   Finger pain, right 03/27/2017   History of colonic polyps 03/27/2016   Pre-ulcerative corn or callous 03/27/2016   Skin nodule 01/10/2016   Venereal disease, unspecified 03/23/2012   Renal insufficiency 04/02/2011   Right elbow pain 04/02/2011   Encounter for well adult exam with abnormal findings 03/30/2011   ANEMIA-B12 DEFICIENCY 12/04/2009   ANEMIA-IRON DEFICIENCY 11/27/2009   B12 deficiency 10/06/2008   KNEE PAIN, RIGHT 10/08/2007   Hyperlipidemia 10/03/2007   Morbid obesity (HCC) 10/03/2007   ERECTILE DYSFUNCTION 10/03/2007   Essential hypertension 10/03/2007   ALLERGIC RHINITIS 10/03/2007   Asthma 10/03/2007   GERD 10/03/2007   ALCOHOL ABUSE, HX OF 10/03/2007   COLONIC POLYPS, HX OF 10/03/2007   Social History   Socioeconomic History   Marital status: Divorced    Spouse name: Not on file   Number of children: 2   Years of education: Not on file   Highest education level: Not on file  Occupational History   Occupation: Magazine features editor: [employer]  Tobacco Use   Smoking status: Some Days   Smokeless tobacco: Never  Vaping Use   Vaping Use: Never used  Substance and Sexual Activity   Alcohol use: Not Currently    Alcohol/week: 0.0 standard drinks   Drug use: Yes    Types: Marijuana    Comment:  marijuana daily   Sexual activity: Not on file  Other Topics Concern   Not on file  Social History Narrative   Right handed   One story home   No caffeine   Social Determinants of Health   Financial Resource Strain: Not on file  Food Insecurity: Not on file  Transportation Needs: Not on file  Physical Activity: Not on file  Stress: Not on file  Social Connections: Not on file  Intimate Partner Violence: Not on file    Mr. Darrell Hoffman's family history includes Alcohol abuse in his paternal uncle; Breast cancer in his mother; Coronary artery disease in an other family member; Diabetes in his cousin and sister; Hypertension in his sister; Lupus in his sister.      Objective:    Vitals:   02/19/22 0912  BP: (!) 154/74  Pulse: 92    Physical Exam Well-developed older AA male in no acute distress.   Weight, 156 BMI 25.2 accompanied by his son , patient ambulates with a cane, with some difficulty  HEENT; nontraumatic normocephalic, EOMI,  PE R LA, sclera anicteric. Oropharynx; and examined today Neck; supple, no JVD Cardiovascular; regular rate and rhythm with S1-S2, no murmur rub or gallop Pulmonary; Clear bilaterally Abdomen; soft, nontender, nondistended, no palpable mass or hepatosplenomegaly, bowel sounds are active Rectal; not done today Skin; benign exam, no jaundice rash or appreciable lesions Extremities; no clubbing cyanosis or edema skin warm and dry Neuro/Psych; alert and oriented x4, grossly nonfocal mood and affect appropriate , gait abnormalities, history of neuropathy       Assessment & Plan:   #83 71 year old African-American male with recent hospitalization with intractable nausea and vomiting x4 to 5 days.  Symptoms resolved and has not recurred.  He is currently not having any difficulty keeping down p.o.'s though does not have much of an appetite, no complaints of abdominal pain.  During hospitalization negative and felt to have symptoms secondary to  cannabis hyperemesis syndrome.  CT of the abdomen pelvis unrevealing with exception of mild distal esophageal wall thickening, and a small supraumbilical hernia  Patient has resumed daily cannabis use though says he has decreased the amount. No current GI symptoms  #2 status post robotic assisted hiatal hernia repair January 2022 for very large hiatal hernia with intrathoracic stomach #3 history of adenomatous colon polyps-up-to-date with colonoscopy due for 5-year interval follow-up July 2025 #4 chronic kidney disease stage III #5 history of B12 deficiency  Plan; we discussed cannabis hyperemesis syndrome and its correlation to high THC levels.  Patient was advised to significantly decrease cannabis use I think it would be a good idea for him to be on a PPI, PPI was prescribed on discharge from the hospital but he has not been taking it.  Advised starting Protonix 40 mg p.o. every morning AC breakfast, he does have some evidence of distal esophagitis by CT  Refill Zofran 4 mg every 6 hours for as needed use We will reach out to neurology to see if he can be worked in sooner, patient does have follow-up with Dr. Jonny Ruiz later this month and encouraged him to discuss his neurologic symptoms with Dr. Jonny Ruiz. Follow-up with Dr. Russella Dar as needed.    Jonanthony Nahar Oswald Hillock PA-C 02/19/2022   Cc: [name], MD

## 2022-03-12 ENCOUNTER — Encounter: Payer: Self-pay | Admitting: Internal Medicine

## 2022-03-12 ENCOUNTER — Ambulatory Visit: Payer: Federal, State, Local not specified - PPO | Admitting: Internal Medicine

## 2022-03-12 VITALS — BP 152/78 | HR 132 | Temp 98.2°F | Ht 66.0 in | Wt 160.6 lb

## 2022-03-12 DIAGNOSIS — I10 Essential (primary) hypertension: Secondary | ICD-10-CM

## 2022-03-12 DIAGNOSIS — R112 Nausea with vomiting, unspecified: Secondary | ICD-10-CM

## 2022-03-12 DIAGNOSIS — K56600 Partial intestinal obstruction, unspecified as to cause: Secondary | ICD-10-CM

## 2022-03-12 DIAGNOSIS — R1909 Other intra-abdominal and pelvic swelling, mass and lump: Secondary | ICD-10-CM

## 2022-03-12 DIAGNOSIS — K440 Diaphragmatic hernia with obstruction, without gangrene: Secondary | ICD-10-CM

## 2022-03-12 DIAGNOSIS — R1900 Intra-abdominal and pelvic swelling, mass and lump, unspecified site: Secondary | ICD-10-CM | POA: Insufficient documentation

## 2022-03-12 DIAGNOSIS — R634 Abnormal weight loss: Secondary | ICD-10-CM

## 2022-03-12 LAB — CBC WITH DIFFERENTIAL/PLATELET
Basophils Absolute: 0 10*3/uL (ref 0.0–0.1)
Basophils Relative: 0.2 % (ref 0.0–3.0)
Eosinophils Absolute: 0 10*3/uL (ref 0.0–0.7)
Eosinophils Relative: 0 % (ref 0.0–5.0)
HCT: 33.5 % — ABNORMAL LOW (ref 39.0–52.0)
Hemoglobin: 10.7 g/dL — ABNORMAL LOW (ref 13.0–17.0)
Lymphocytes Relative: 16.7 % (ref 12.0–46.0)
Lymphs Abs: 1.8 10*3/uL (ref 0.7–4.0)
MCHC: 31.9 g/dL (ref 30.0–36.0)
MCV: 84.7 fl (ref 78.0–100.0)
Monocytes Absolute: 0.5 10*3/uL (ref 0.1–1.0)
Monocytes Relative: 4.7 % (ref 3.0–12.0)
Neutro Abs: 8.6 10*3/uL — ABNORMAL HIGH (ref 1.4–7.7)
Neutrophils Relative %: 78.4 % — ABNORMAL HIGH (ref 43.0–77.0)
Platelets: 234 10*3/uL (ref 150.0–400.0)
RBC: 3.95 Mil/uL — ABNORMAL LOW (ref 4.22–5.81)
RDW: 17.1 % — ABNORMAL HIGH (ref 11.5–15.5)
WBC: 11 10*3/uL — ABNORMAL HIGH (ref 4.0–10.5)

## 2022-03-12 LAB — HEPATIC FUNCTION PANEL
ALT: 61 U/L — ABNORMAL HIGH (ref 0–53)
AST: 36 U/L (ref 0–37)
Albumin: 4.4 g/dL (ref 3.5–5.2)
Alkaline Phosphatase: 128 U/L — ABNORMAL HIGH (ref 39–117)
Bilirubin, Direct: 0.2 mg/dL (ref 0.0–0.3)
Total Bilirubin: 0.9 mg/dL (ref 0.2–1.2)
Total Protein: 7.5 g/dL (ref 6.0–8.3)

## 2022-03-12 LAB — BASIC METABOLIC PANEL
BUN: 17 mg/dL (ref 6–23)
CO2: 26 mEq/L (ref 19–32)
Calcium: 10 mg/dL (ref 8.4–10.5)
Chloride: 101 mEq/L (ref 96–112)
Creatinine, Ser: 1.21 mg/dL (ref 0.40–1.50)
GFR: 61.04 mL/min (ref >60.00)
Glucose, Bld: 133 mg/dL — ABNORMAL HIGH (ref 70–99)
Potassium: 5.3 mEq/L — ABNORMAL HIGH (ref 3.5–5.1)
Sodium: 138 mEq/L (ref 135–145)

## 2022-03-12 LAB — LIPASE: Lipase: 4 U/L — ABNORMAL LOW (ref 11.0–59.0)

## 2022-03-12 MED ORDER — ONDANSETRON 4 MG PO TBDP: 4.0000 mg | ORAL_TABLET | Freq: Three times a day (TID) | ORAL | Status: AC | PRN

## 2022-03-12 NOTE — Assessment & Plan Note (Signed)
With recent improvement symptoms it seems, for contd GI f/u as planned ?

## 2022-03-12 NOTE — Patient Instructions (Signed)
Please take all new medication as prescribed - the nausea medication ? ?Please continue all other medications as before, and refills have been done if requested. ? ?Please have the pharmacy call with any other refills you may need. ? ?Please continue your efforts at being more active, low cholesterol diet, and weight control. ? ?Please keep your appointments with your specialists as you may have planned ? ?You will be contacted regarding the referral for: CT scan - urgent ? ?Please go to the LAB at the blood drawing area for the tests to be done ? ?You will be contacted by phone if any changes need to be made immediately.  Otherwise, you will receive a letter about your results with an explanation, but please check with MyChart first. ? ?Please remember to sign up for MyChart if you have not done so, as this will be important to you in the future with finding out test results, communicating by private email, and scheduling acute appointments online when needed. ? ? ? ? ? ?

## 2022-03-12 NOTE — Assessment & Plan Note (Signed)
Possible hernia - for CT as above ?

## 2022-03-12 NOTE — Assessment & Plan Note (Signed)
Now with intermittent dry heaves in last 4 days again, this time associated with a subq lump above the umbilicus, suspicous for acute hernia now symptomatic.  For labs as ordered, and CT abd/pelvis urgent, and consider general surgury referral.  ?

## 2022-03-12 NOTE — Assessment & Plan Note (Signed)
Marked improved recent,  to f/u any worsening symptoms or concerns ?

## 2022-03-12 NOTE — Progress Notes (Signed)
Patient ID: Darrell Hannan., male   DOB: August 14, 1951, 71 y.o.   MRN: [id number] ? ? ? ?    Chief Complaint: follow up n/v ? ?     HPI:  Darrell Lozinski. is a 71 y.o. male here with son to f/u, overall doing ok in that after last visit his symptoms abated and gained 20 lbs with better po intake, but unfortuantely now has had recurrent onset dry heaves for 3 days though able to keep some po fluids down and Denies worsening reflux, abd pain, dysphagia, bowel change or blood. Also he notes an unusual mild tender but definite lump under the skin just above the umbilicus, possibly at the hernia site noted on CT jan 2023 containing fat only at the time.  Also not taking Vit D, Had mild low Na with last labs.  Not really trying to follow lower chol diet as he has just been trying to gain wt.  Pt denies chest pain, increased sob or doe, wheezing, orthopnea, PND, increased LE swelling, palpitations, dizziness or syncope.  Pt denies polydipsia, polyuria, or new focal neuro s/s.    Pt denies fever, wt loss, night sweats, or other constitutional symptoms  ?      ?Wt Readings from Last 3 Encounters:  ?03/12/22 160 lb 9.6 oz (72.8 kg)  ?02/19/22 156 lb 3.2 oz (70.9 kg)  ?02/04/22 140 lb 12.8 oz (63.9 kg)  ? ?BP Readings from Last 3 Encounters:  ?03/12/22 (!) 152/78  ?02/19/22 (!) 154/74  ?02/04/22 110/70  ? ?      ?Past Medical History:  ?Diagnosis Date  ? ALCOHOL ABUSE, HX OF 10/03/2007  ? ALLERGIC RHINITIS 10/03/2007  ? ANEMIA-B12 DEFICIENCY 12/04/2009  ? ANEMIA-IRON DEFICIENCY 11/27/2009  ? ANEMIA-NOS 10/08/2007  ? ASTHMA 10/03/2007  ? Asthma   ? B12 DEFICIENCY 10/06/2008  ? CKD (chronic kidney disease) stage 3, GFR 30-59 ml/min (HCC) 03/31/2018  ? COLONIC POLYPS, HX OF 10/03/2007  ? ERECTILE DYSFUNCTION 10/03/2007  ? GERD 10/03/2007  ? History of hiatal hernia   ? found on CXT in June 2021  ? HYPERLIPIDEMIA 10/03/2007  ? HYPERTENSION 10/03/2007  ? KNEE PAIN, RIGHT 10/08/2007  ? Morbid obesity (HCC) 10/03/2007  ? Neuromuscular  disorder (HCC)   ? per pt. never diagnosed  ? Substance abuse (HCC)   ? marijuana daily  ? ?Past Surgical History:  ?Procedure Laterality Date  ? bilateral hydrocele repair    ? COLONOSCOPY  06/06/2016  ? POLYPECTOMY  01-03-2010  ? s/p left knee arthroscopy  2009  ? s/p TKR  03/2009  ? VASECTOMY    ? XI ROBOTIC ASSISTED HIATAL HERNIA REPAIR N/A 01/04/2021  ? Procedure: XI ROBOTIC ASSISTED HIATAL HERNIA REPAIR AND FUNDOPLICATION WITH MESH;  Surgeon: Axel Filler, MD;  Location: [facility];  Service: General;  Laterality: N/A;  ? ? reports that he has been smoking. He has never used smokeless tobacco. He reports that he does not currently use alcohol. He reports current drug use. Drug: Marijuana. ?family history includes Alcohol abuse in his paternal uncle; Breast cancer in his mother; Coronary artery disease in an other family member; Diabetes in his cousin and sister; Hypertension in his sister; Lupus in his sister. ?No Known Allergies ?Current Outpatient Medications on File Prior to Visit  ?Medication Sig Dispense Refill  ? CIALIS 20 MG tablet TAKE 1 TABLET BY MOUTH DAILY AS NEEDED 5 tablet 11  ? gabapentin (NEURONTIN) 600 MG tablet Take 1 tablet (600 mg total)  by mouth 4 (four) times daily. 120 tablet 5  ? ?No current facility-administered medications on file prior to visit.  ? ?     ROS:  All others reviewed and negative. ? ?Objective  ? ?     PE:  BP (!) 152/78 (BP Location: Left Arm, Patient Position: Sitting, Cuff Size: Normal)   Pulse (!) 132   Temp 98.2 ?F (36.8 ?C) (Oral)   Ht 5\' 6"  (1.676 m)   Wt 160 lb 9.6 oz (72.8 kg)   SpO2 98%   BMI 25.92 kg/m?  ? ?              Constitutional: Pt appears in NAD ?              HENT: Head: NCAT.  ?              Right Ear: External ear normal.   ?              Left Ear: External ear normal.  ?              Eyes: . Pupils are equal, round, and reactive to light. Conjunctivae and EOM are normal ?              Nose: without d/c or deformity ?              Neck: Neck  supple. Gross normal ROM ?              Cardiovascular: Normal rate and regular rhythm.   ?              Pulmonary/Chest: Effort normal and breath sounds without rales or wheezing.  ?              Abd:  Soft, NT, ND, + BS, no organomegaly ?              Neurological: Pt is alert. At baseline orientation, motor grossly intact ?              Skin: Skin is warm. No rashes, no other new lesions, LE edema - none ?              Psychiatric: Pt behavior is normal without agitation  ? ?Micro: none ? ?Cardiac tracings I have personally interpreted today:  none ? ?Pertinent Radiological findings (summarize): jan 2023 CT abd/pelvis ?IMPRESSION: ?1. No evidence of bowel obstruction or definite acute abdominal ?process status post interval surgical repair of a large hiatal ?hernia. ?2. Mild nonspecific distal esophageal and proximal gastric wall ?thickening. ?3. Stable incidental findings including a left renal cyst, mild ?prostatomegaly and a small supraumbilical hernia containing only ?fat. ?4. Aortic Atherosclerosis (ICD10-I70.0) and Emphysema (ICD10-J43.9).  ? ?Lab Results  ?Component Value Date  ? WBC 11.0 (H) 03/12/2022  ? HGB 10.7 (L) 03/12/2022  ? HCT 33.5 (L) 03/12/2022  ? PLT 234.0 03/12/2022  ? GLUCOSE 133 (H) 03/12/2022  ? CHOL 190 02/04/2022  ? TRIG 155.0 (H) 02/04/2022  ? HDL 47.50 02/04/2022  ? LDLDIRECT 71.0 06/07/2020  ? LDLCALC 111 (H) 02/04/2022  ? ALT 61 (H) 03/12/2022  ? AST 36 03/12/2022  ? NA 138 03/12/2022  ? K 5.3 (H) 03/12/2022  ? CL 101 03/12/2022  ? CREATININE 1.21 03/12/2022  ? BUN 17 03/12/2022  ? CO2 26 03/12/2022  ? TSH 2.97 02/04/2022  ? PSA 0.32 02/04/2022  ? INR 2.5 (H) 04/23/2009  ? HGBA1C 5.7 12/11/2020  ? ?Assessment/Plan:  ?  Darrell Seaberry. is a 71 y.o. Black or African American [2] male with  has a past medical history of ALCOHOL ABUSE, HX OF (10/03/2007), ALLERGIC RHINITIS (10/03/2007), ANEMIA-B12 DEFICIENCY (12/04/2009), ANEMIA-IRON DEFICIENCY (11/27/2009), ANEMIA-NOS (10/08/2007),  ASTHMA (10/03/2007), Asthma, B12 DEFICIENCY (10/06/2008), CKD (chronic kidney disease) stage 3, GFR 30-59 ml/min (HCC) (03/31/2018), COLONIC POLYPS, HX OF (10/03/2007), ERECTILE DYSFUNCTION (10/03/2007), GERD (10/03/2007), History of hiatal hernia, HYPERLIPIDEMIA (10/03/2007), HYPERTENSION (10/03/2007), KNEE PAIN, RIGHT (10/08/2007), Morbid obesity (HCC) (10/03/2007), Neuromuscular disorder (HCC), and Substance abuse (HCC). ? ?Nausea and vomiting ?Now with intermittent dry heaves in last 4 days again, this time associated with a subq lump above the umbilicus, suspicous for acute hernia now symptomatic.  For labs as ordered, and CT abd/pelvis urgent, and consider general surgury referral.  ? ?Hiatal hernia with obstruction but no gangrene ?With recent improvement symptoms it seems, for contd GI f/u as planned ? ?Abdominal mass ?Possible hernia - for CT as above ? ?Weight loss ?Marked improved recent,  to f/u any worsening symptoms or concerns ? ?Essential hypertension ?BP Readings from Last 3 Encounters:  ?03/12/22 (!) 152/78  ?02/19/22 (!) 154/74  ?02/04/22 110/70  ? ?Mild uncontrolled with wt gain, pt to continue low salt diet, declines other change in tx for now given GI symptoms ? ?Followup: Return if symptoms worsen or fail to improve. ? ?Darrell Barre, MD 03/12/2022 9:53 PM ?[facility] ? Primary Care - [facility] ?Internal Medicine ?

## 2022-03-12 NOTE — Assessment & Plan Note (Signed)
BP Readings from Last 3 Encounters:  ?03/12/22 (!) 152/78  ?02/19/22 (!) 154/74  ?02/04/22 110/70  ? ?Mild uncontrolled with wt gain, pt to continue low salt diet, declines other change in tx for now given GI symptoms ?

## 2022-03-13 ENCOUNTER — Ambulatory Visit
Admission: RE | Admit: 2022-03-13 | Discharge: 2022-03-13 | Disposition: A | Discharge status: Home / Self Care | Payer: Federal, State, Local not specified - PPO | Source: Ambulatory Visit | Attending: Internal Medicine | Admitting: Internal Medicine

## 2022-03-13 ENCOUNTER — Other Ambulatory Visit: Payer: Self-pay | Admitting: Internal Medicine

## 2022-03-13 ENCOUNTER — Telehealth: Payer: Self-pay | Admitting: *Deleted

## 2022-03-13 DIAGNOSIS — K810 Acute cholecystitis: Secondary | ICD-10-CM

## 2022-03-13 DIAGNOSIS — R112 Nausea with vomiting, unspecified: Secondary | ICD-10-CM

## 2022-03-13 DIAGNOSIS — K56600 Partial intestinal obstruction, unspecified as to cause: Secondary | ICD-10-CM

## 2022-03-13 DIAGNOSIS — K439 Ventral hernia without obstruction or gangrene: Secondary | ICD-10-CM | POA: Diagnosis not present

## 2022-03-13 DIAGNOSIS — K828 Other specified diseases of gallbladder: Secondary | ICD-10-CM | POA: Diagnosis not present

## 2022-03-13 DIAGNOSIS — K4090 Unilateral inguinal hernia, without obstruction or gangrene, not specified as recurrent: Secondary | ICD-10-CM | POA: Diagnosis not present

## 2022-03-13 DIAGNOSIS — R1110 Vomiting, unspecified: Secondary | ICD-10-CM | POA: Diagnosis not present

## 2022-03-13 MED ORDER — IOPAMIDOL (ISOVUE-300) INJECTION 61%
100.0000 mL | Freq: Once | INTRAVENOUS | Status: AC | PRN
  Administered 2022-03-13: 100 mL via INTRAVENOUS

## 2022-03-13 NOTE — Telephone Encounter (Signed)
Darrell Hoffman calling from [facility] calling to call report on CT abdomen wanted to make provider know that results are ready  ? ?

## 2022-03-14 LAB — URINALYSIS, ROUTINE W REFLEX MICROSCOPIC
Bilirubin Urine: NEGATIVE
Hgb urine dipstick: NEGATIVE
Leukocytes,Ua: NEGATIVE
Nitrite: NEGATIVE
Specific Gravity, Urine: 1.015 (ref 1.000–1.030)
Urine Glucose: NEGATIVE
Urobilinogen, UA: 0.2 (ref 0.0–1.0)
pH: 7 (ref 5.0–8.0)

## 2022-03-17 ENCOUNTER — Encounter (HOSPITAL_COMMUNITY): Payer: Self-pay | Admitting: Emergency Medicine

## 2022-03-17 ENCOUNTER — Other Ambulatory Visit: Payer: Self-pay

## 2022-03-17 ENCOUNTER — Inpatient Hospital Stay (HOSPITAL_COMMUNITY)
Admission: EM | Admit: 2022-03-17 | Discharge: 2022-03-20 | DRG: 309 | Disposition: A | Discharge status: Home / Self Care | Payer: Federal, State, Local not specified - PPO | Attending: Internal Medicine | Admitting: Internal Medicine

## 2022-03-17 ENCOUNTER — Inpatient Hospital Stay (HOSPITAL_COMMUNITY): Payer: Federal, State, Local not specified - PPO

## 2022-03-17 DIAGNOSIS — N1831 Chronic kidney disease, stage 3a: Secondary | ICD-10-CM | POA: Diagnosis present

## 2022-03-17 DIAGNOSIS — Z79899 Other long term (current) drug therapy: Secondary | ICD-10-CM | POA: Diagnosis not present

## 2022-03-17 DIAGNOSIS — Z96652 Presence of left artificial knee joint: Secondary | ICD-10-CM | POA: Diagnosis not present

## 2022-03-17 DIAGNOSIS — F1220 Cannabis dependence, uncomplicated: Secondary | ICD-10-CM | POA: Diagnosis not present

## 2022-03-17 DIAGNOSIS — R778 Other specified abnormalities of plasma proteins: Secondary | ICD-10-CM | POA: Diagnosis not present

## 2022-03-17 DIAGNOSIS — E785 Hyperlipidemia, unspecified: Secondary | ICD-10-CM | POA: Diagnosis not present

## 2022-03-17 DIAGNOSIS — G609 Hereditary and idiopathic neuropathy, unspecified: Secondary | ICD-10-CM | POA: Diagnosis not present

## 2022-03-17 DIAGNOSIS — Z8249 Family history of ischemic heart disease and other diseases of the circulatory system: Secondary | ICD-10-CM | POA: Diagnosis not present

## 2022-03-17 DIAGNOSIS — I10 Essential (primary) hypertension: Secondary | ICD-10-CM | POA: Diagnosis not present

## 2022-03-17 DIAGNOSIS — I4892 Unspecified atrial flutter: Secondary | ICD-10-CM | POA: Diagnosis not present

## 2022-03-17 DIAGNOSIS — R112 Nausea with vomiting, unspecified: Secondary | ICD-10-CM

## 2022-03-17 DIAGNOSIS — K219 Gastro-esophageal reflux disease without esophagitis: Secondary | ICD-10-CM | POA: Diagnosis not present

## 2022-03-17 DIAGNOSIS — E872 Acidosis, unspecified: Secondary | ICD-10-CM | POA: Diagnosis not present

## 2022-03-17 DIAGNOSIS — I129 Hypertensive chronic kidney disease with stage 1 through stage 4 chronic kidney disease, or unspecified chronic kidney disease: Secondary | ICD-10-CM | POA: Diagnosis not present

## 2022-03-17 DIAGNOSIS — R7989 Other specified abnormal findings of blood chemistry: Secondary | ICD-10-CM

## 2022-03-17 DIAGNOSIS — Z20822 Contact with and (suspected) exposure to covid-19: Secondary | ICD-10-CM | POA: Diagnosis not present

## 2022-03-17 DIAGNOSIS — I4891 Unspecified atrial fibrillation: Secondary | ICD-10-CM | POA: Diagnosis not present

## 2022-03-17 DIAGNOSIS — D649 Anemia, unspecified: Secondary | ICD-10-CM | POA: Diagnosis present

## 2022-03-17 DIAGNOSIS — G894 Chronic pain syndrome: Secondary | ICD-10-CM | POA: Diagnosis not present

## 2022-03-17 DIAGNOSIS — R5381 Other malaise: Secondary | ICD-10-CM | POA: Diagnosis not present

## 2022-03-17 LAB — COMPREHENSIVE METABOLIC PANEL
ALT: 29 U/L (ref 0–44)
AST: 26 U/L (ref 15–41)
Albumin: 4.1 g/dL (ref 3.5–5.0)
Alkaline Phosphatase: 80 U/L (ref 38–126)
Anion gap: 14 (ref 5–15)
BUN: 22 mg/dL (ref 8–23)
CO2: 22 mmol/L (ref 22–32)
Calcium: 9.4 mg/dL (ref 8.9–10.3)
Chloride: 99 mmol/L (ref 98–111)
Creatinine, Ser: 1.2 mg/dL (ref 0.61–1.24)
GFR, Estimated: >60 mL/min (ref >60)
Glucose, Bld: 94 mg/dL (ref 70–99)
Potassium: 3.9 mmol/L (ref 3.5–5.1)
Sodium: 135 mmol/L (ref 135–145)
Total Bilirubin: 1.4 mg/dL — ABNORMAL HIGH (ref 0.3–1.2)
Total Protein: 7.2 g/dL (ref 6.5–8.1)

## 2022-03-17 LAB — CBC
HCT: 40.5 % (ref 39.0–52.0)
Hemoglobin: 13.3 g/dL (ref 13.0–17.0)
MCH: 27.4 pg (ref 26.0–34.0)
MCHC: 32.8 g/dL (ref 30.0–36.0)
MCV: 83.3 fL (ref 80.0–100.0)
Platelets: 204 10*3/uL (ref 150–400)
RBC: 4.86 MIL/uL (ref 4.22–5.81)
RDW: 16.4 % — ABNORMAL HIGH (ref 11.5–15.5)
WBC: 11.9 10*3/uL — ABNORMAL HIGH (ref 4.0–10.5)
nRBC: 0 % (ref 0.0–0.2)

## 2022-03-17 LAB — LIPASE, BLOOD: Lipase: 24 U/L (ref 11–51)

## 2022-03-17 LAB — TROPONIN I (HIGH SENSITIVITY)
Troponin I (High Sensitivity): 48 ng/L — ABNORMAL HIGH (ref <18)
Troponin I (High Sensitivity): 45 ng/L — ABNORMAL HIGH (ref <18)

## 2022-03-17 LAB — MAGNESIUM: Magnesium: 2.1 mg/dL (ref 1.7–2.4)

## 2022-03-17 MED ORDER — SODIUM CHLORIDE 0.9 % IV BOLUS
1000.0000 mL | Freq: Once | INTRAVENOUS | Status: AC
  Administered 2022-03-17: 1000 mL via INTRAVENOUS

## 2022-03-17 MED ORDER — ACETAMINOPHEN 650 MG RE SUPP: 650.0000 mg | Freq: Four times a day (QID) | RECTAL | Status: DC | PRN

## 2022-03-17 MED ORDER — PANTOPRAZOLE SODIUM 40 MG IV SOLR
40.0000 mg | INTRAVENOUS | Status: DC
  Administered 2022-03-18: 40 mg via INTRAVENOUS
  Filled 2022-03-17: qty 10

## 2022-03-17 MED ORDER — APIXABAN 5 MG PO TABS
5.0000 mg | ORAL_TABLET | Freq: Once | ORAL | Status: AC
  Administered 2022-03-17: 5 mg via ORAL
  Filled 2022-03-17: qty 1

## 2022-03-17 MED ORDER — DILTIAZEM LOAD VIA INFUSION
15.0000 mg | Freq: Once | INTRAVENOUS | Status: AC
  Administered 2022-03-17: 15 mg via INTRAVENOUS
  Filled 2022-03-17: qty 15

## 2022-03-17 MED ORDER — ACETAMINOPHEN 325 MG PO TABS: 650.0000 mg | ORAL_TABLET | Freq: Four times a day (QID) | ORAL | Status: DC | PRN

## 2022-03-17 MED ORDER — ADENOSINE 6 MG/2ML IV SOLN
6.0000 mg | Freq: Once | INTRAVENOUS | Status: AC
  Administered 2022-03-17: 6 mg via INTRAVENOUS
  Filled 2022-03-17: qty 2

## 2022-03-17 MED ORDER — SODIUM CHLORIDE 0.9 % IV SOLN
INTRAVENOUS | Status: AC
Start: 2022-03-17 — End: 2022-03-18
  Administered 2022-03-18: 125 mL/h via INTRAVENOUS

## 2022-03-17 MED ORDER — DILTIAZEM HCL-DEXTROSE 125-5 MG/125ML-% IV SOLN (PREMIX)
5.0000 mg/h | INTRAVENOUS | Status: DC
  Administered 2022-03-17: 5 mg/h via INTRAVENOUS
  Administered 2022-03-18 (×2): 15 mg/h via INTRAVENOUS
  Filled 2022-03-17 (×2): qty 125

## 2022-03-17 MED ORDER — ONDANSETRON 4 MG PO TBDP
4.0000 mg | ORAL_TABLET | Freq: Once | ORAL | Status: AC | PRN
Start: 2022-03-17 — End: 2022-03-17
  Administered 2022-03-17: 4 mg via ORAL
  Filled 2022-03-17: qty 1

## 2022-03-17 MED ORDER — ONDANSETRON HCL 4 MG/2ML IJ SOLN: 4.0000 mg | Freq: Four times a day (QID) | INTRAMUSCULAR | Status: DC | PRN

## 2022-03-17 MED ORDER — METOPROLOL TARTRATE 5 MG/5ML IV SOLN
5.0000 mg | Freq: Once | INTRAVENOUS | Status: AC
  Administered 2022-03-17: 5 mg via INTRAVENOUS
  Filled 2022-03-17: qty 5

## 2022-03-17 NOTE — Assessment & Plan Note (Addendum)
This is an ongoing problem in the setting of chronic marijuana use.  CT done in January 2023 showing evidence of distal esophagitis and was started on PPI by gastroenterology.  Recently seen by PCP for the same problem and CT done 3/28 showing gallbladder wall thickening and pericholecystic fluid; no gallstones or biliary ductal dilatation.  Findings concerning for acute cholecystitis, although nonspecific in the setting of small volume ascites throughout the abdomen and pelvis.  Labs done today showing borderline stable leukocytosis and no significant elevation of liver enzymes.  Lipase normal.  He is not febrile.  Not endorsing abdominal pain and exam benign.  Urinalysis unrevealing.  Right upper quadrant ultrasound with no gallbladder wall thickening, no pericholecystic fluid, no visualized stones, no sonographic Murphy sign. ?--Tolerating diet ?--Antiemetic as needed ?--IV Protonix 40 mg daily ?--Counseled on THC cessation ?

## 2022-03-17 NOTE — ED Triage Notes (Signed)
Pt c/o nausea/vomiting x3-4 days, keeping little PO down. Denies abd pain, SHOB, diarrhea, CP, sick contacts ? ?Hx CKD3, hiatal hernia, alcohol use/abuse. ?

## 2022-03-17 NOTE — Assessment & Plan Note (Addendum)
Not on any antihypertensives at home.  Blood pressure currently stable. ?--On Cardizem 9 mg p.o. every 6 hours as above ?

## 2022-03-17 NOTE — Assessment & Plan Note (Addendum)
Upon ED presentation, patient was noted to be in a flutter with RVR with rates in the 140s.  Patient was given adenosine, metoprolol, IV Cardizem bolus and started on a continuous infusion.  CHA2DS2-VASc score = 2.  New onset.  UDS positive for THC.  TSH 1.752.  TTE with low normal LVEF 50-55%, no regional wall motion abnormalities, grade 1 diastolic dysfunction, mild MR, moderate TR, no aortic stenosis, IVC normal in size. ?--Cardiology following, appreciate assistance ?--Cardizem increased to '90mg'$  PO q6h today by cardiology ?--Eliquis 5 mg p.o. twice daily ?--Continue to monitor on telemetry ?

## 2022-03-17 NOTE — ED Provider Notes (Signed)
?[facility] ?Provider Note ? ? ?CSN: [id number] ?Arrival date & time: 03/17/22  1751 ? ?  ? ?History ? ?Chief Complaint  ?Patient presents with  ? Nausea  ? ? ?Darrell Hoffman. is a 71 y.o. male. ? ?Patient presents to ER chief complaint of nausea and vomiting.  Symptoms ongoing for the past 3 days.  Describes nonbloody vomiting.  Denies any chest pain at this time.  No fever no cough no diarrhea. ? ? ?  ? ?Home Medications ?Prior to Admission medications   ?Medication Sig Start Date End Date Taking? Authorizing Provider  ?CIALIS 20 MG tablet TAKE 1 TABLET BY MOUTH DAILY AS NEEDED 04/23/12   Biagio Borg, MD  ?gabapentin (NEURONTIN) 600 MG tablet Take 1 tablet (600 mg total) by mouth 4 (four) times daily. 08/15/21   Pieter Partridge, DO  ?ondansetron (ZOFRAN-ODT) 4 MG disintegrating tablet Take 1 tablet (4 mg total) by mouth every 8 (eight) hours as needed for nausea or vomiting. 03/12/22   Biagio Borg, MD  ?   ? ?Allergies    ?Patient has no known allergies.   ? ?Review of Systems   ?Review of Systems  ?Constitutional:  Negative for fever.  ?HENT:  Negative for ear pain and sore throat.   ?Eyes:  Negative for pain.  ?Respiratory:  Negative for cough.   ?Cardiovascular:  Negative for chest pain.  ?Gastrointestinal:  Negative for abdominal pain.  ?Genitourinary:  Negative for flank pain.  ?Musculoskeletal:  Negative for back pain.  ?Skin:  Negative for color change and rash.  ?Neurological:  Negative for syncope.  ?All other systems reviewed and are negative. ? ?Physical Exam ?Updated Vital Signs ?BP 122/82   Pulse 68   Temp 98.5 ?F (36.9 ?C) (Oral)   Resp 12   Ht '5\' 6"'$  (1.676 m)   Wt 72.8 kg   SpO2 100%   BMI 25.90 kg/m?  ?Physical Exam ?Constitutional:   ?   Appearance: He is well-developed.  ?HENT:  ?   Head: Normocephalic.  ?   Nose: Nose normal.  ?Eyes:  ?   Extraocular Movements: Extraocular movements intact.  ?Cardiovascular:  ?   Rate and Rhythm: Regular rhythm.  Tachycardia present.  ?Pulmonary:  ?   Effort: Pulmonary effort is normal.  ?Skin: ?   Coloration: Skin is not jaundiced.  ?Neurological:  ?   Mental Status: He is alert. Mental status is at baseline.  ? ? ?ED Results / Procedures / Treatments   ?Labs ?(all labs ordered are listed, but only abnormal results are displayed) ?Labs Reviewed  ?COMPREHENSIVE METABOLIC PANEL - Abnormal; Notable for the following components:  ?    Result Value  ? Total Bilirubin 1.4 (*)   ? All other components within normal limits  ?CBC - Abnormal; Notable for the following components:  ? WBC 11.9 (*)   ? RDW 16.4 (*)   ? All other components within normal limits  ?TROPONIN I (HIGH SENSITIVITY) - Abnormal; Notable for the following components:  ? Troponin I (High Sensitivity) 48 (*)   ? All other components within normal limits  ?LIPASE, BLOOD  ?MAGNESIUM  ?URINALYSIS, ROUTINE W REFLEX MICROSCOPIC  ?TROPONIN I (HIGH SENSITIVITY)  ? ? ?EKG ? ? ?Radiology ?No results found. ? ?Procedures ?[facility] KitchenCritical Care ?Performed by: Luna Fuse, MD ?Authorized by: Luna Fuse, MD  ? ?Critical care provider statement:  ?  Critical care time (minutes):  40 ?  Critical  care time was exclusive of:  Separately billable procedures and treating other patients and teaching time ?  Critical care was necessary to treat or prevent imminent or life-threatening deterioration of the following conditions:  Cardiac failure  ? ? ?Medications Ordered in ED ?Medications  ?diltiazem (CARDIZEM) 1 mg/mL load via infusion 15 mg (15 mg Intravenous Bolus from Bag 03/17/22 1950)  ?  And  ?diltiazem (CARDIZEM) 125 mg in dextrose 5% 125 mL (1 mg/mL) infusion (15 mg/hr Intravenous Rate/Dose Change 03/17/22 2051)  ?ondansetron (ZOFRAN-ODT) disintegrating tablet 4 mg (4 mg Oral Given 03/17/22 1803)  ?sodium chloride 0.9 % bolus 1,000 mL (1,000 mLs Intravenous New Bag/Given 03/17/22 1934)  ?adenosine (ADENOCARD) 6 MG/2ML injection 6 mg (6 mg Intravenous Given 03/17/22 1942)  ?apixaban  (ELIQUIS) tablet 5 mg (5 mg Oral Given 03/17/22 2031)  ?metoprolol tartrate (LOPRESSOR) injection 5 mg (5 mg Intravenous Given 03/17/22 2115)  ? ? ?ED Course/ Medical Decision Making/ A&P ?  ?                        ?Medical Decision Making ?Amount and/or Complexity of Data Reviewed ?Labs: ordered. ? ?Risk ?Prescription drug management. ? ? ?Chart review shows primary care office visit last week for vomiting. ? ?Cardiac monitor shows irregular tachycardic rhythm appears to be either sinus or SVT. ? ?Labs sent.  White count normal chemistry normal troponin similar to prior levels.  Magnesium 2.1. ? ?Patient given adenosine 6 mg IV, he did have a break in his rhythm but underlying flutter waves are visible.  At this point he was given diltiazem 15 mg IV and the diltiazem drip.  Diltiazem was maxed out without significant improvement of heart rate, still ranging from about 130s to 140 bpm. ? ?At this point he was given 5 mg of metoprolol with significant improvement of heart rate, blood pressure remained stable.  Patient given Eliquis 5 mg orally. ? ?Admitted to the hospitalist team. ? ? ? ? ? ? ? ?Final Clinical Impression(s) / ED Diagnoses ?Final diagnoses:  ?Atrial flutter with rapid ventricular response ([place])  ? ? ?Rx / DC Orders ?ED Discharge Orders   ? ? None  ? ?  ? ? ?  ?Luna Fuse, MD ?03/17/22 2158 ? ?

## 2022-03-17 NOTE — H&P (Signed)
?History and Physical  ? ? ?Darrell Hoffman. DPO:[id number] DOB: February 11, 1951 DOA: 03/17/2022 ? ?PCP: Biagio Borg, MD ? ?Patient coming from: Home ? ?Chief Complaint: Nausea and vomiting ? ?HPI: Darrell Hoffman. is a 71 y.o. male with medical history significant of idiopathic polyneuropathy, chronic pain syndrome, hypertension, hyperlipidemia, GERD, chronic marijuana use, history of hiatal hernia repair surgery in January 2022.  Admitted in January 2023 for intractable nausea and vomiting felt to be secondary to cannabinoid hyperemesis syndrome.  He was seen by GI on 02/19/2022 and his nausea and vomiting were again felt to be due to cannabis hyperemesis syndrome.  He was started on Protonix 40 mg daily as he had evidence of distal esophagitis on prior CT.  Seen by PCP on 03/12/2022 for nausea and vomiting and exam concerning for supraumbilical hernia, he was referred to general surgery.  CT done the following day showing gallbladder wall thickening and pericholecystic fluid; no gallstones or biliary ductal dilatation.  Findings concerning for acute cholecystitis, although nonspecific in the setting of small volume ascites throughout the abdomen and pelvis.  ? ?Patient presents to the ED today complaining of nausea and vomiting.  Found to be in new onset atrial flutter with rate in the 140s.  Labs showing WBC 11.9, slightly elevated on previous labs as well.  T. bili 1.4, remainder of LFTs normal.  Lipase normal.  UA pending.  High-sensitivity troponin 48, improved compared to prior labs.  Magnesium within normal range. Patient was given adenosine, metoprolol, Cardizem bolus and started on continuous infusion.  Also started on Eliquis and was given 1 L IV fluid bolus. ? ?History per patient: He continues to smoke marijuana.  Vomiting for the past 4 days and not able to keep any food down.  Denies fevers, abdominal pain, or diarrhea.  He is feeling lightheaded since his symptoms started.  Denies chest pain, shortness of  breath, or palpitations.  States he just had a CT done a few days ago by his PCP and was referred to a surgeon but does not have an appointment yet. ? ?Review of Systems:  ?Review of Systems  ?All other systems reviewed and are negative. ? ?Past Medical History:  ?Diagnosis Date  ? ALCOHOL ABUSE, HX OF 10/03/2007  ? ALLERGIC RHINITIS 10/03/2007  ? ANEMIA-B12 DEFICIENCY 12/04/2009  ? ANEMIA-IRON DEFICIENCY 11/27/2009  ? ANEMIA-NOS 10/08/2007  ? ASTHMA 10/03/2007  ? Asthma   ? B12 DEFICIENCY 10/06/2008  ? CKD (chronic kidney disease) stage 3, GFR 30-59 ml/min (HCC) 03/31/2018  ? COLONIC POLYPS, HX OF 10/03/2007  ? ERECTILE DYSFUNCTION 10/03/2007  ? GERD 10/03/2007  ? History of hiatal hernia   ? found on CXT in June 2021  ? HYPERLIPIDEMIA 10/03/2007  ? HYPERTENSION 10/03/2007  ? KNEE PAIN, RIGHT 10/08/2007  ? Morbid obesity ([place]) 10/03/2007  ? Neuromuscular disorder ([place])   ? per pt. never diagnosed  ? Substance abuse ([place])   ? marijuana daily  ? ? ?Past Surgical History:  ?Procedure Laterality Date  ? bilateral hydrocele repair    ? COLONOSCOPY  06/06/2016  ? POLYPECTOMY  01-03-2010  ? s/p left knee arthroscopy  2009  ? s/p TKR  03/2009  ? VASECTOMY    ? XI ROBOTIC ASSISTED HIATAL HERNIA REPAIR N/A 01/04/2021  ? Procedure: XI ROBOTIC ASSISTED HIATAL HERNIA REPAIR AND FUNDOPLICATION WITH MESH;  Surgeon: Ralene Ok, MD;  Location: [facility];  Service: General;  Laterality: N/A;  ? ? ? reports that he has been  smoking. He has never used smokeless tobacco. He reports that he does not currently use alcohol. He reports current drug use. Drug: Marijuana. ? ?No Known Allergies ? ?Family History  ?Problem Relation Age of Onset  ? Breast cancer Mother   ? Lupus Sister   ? Hypertension Sister   ? Diabetes Sister   ? Alcohol abuse Paternal Uncle   ? Coronary artery disease Other   ?     male 1st degree relative - MI at 70 yo  ? Diabetes Cousin   ?     2 cousins  ? Colon cancer Neg Hx   ? Esophageal cancer Neg Hx   ? Rectal  cancer Neg Hx   ? Stomach cancer Neg Hx   ? Colon polyps Neg Hx   ? ? ?Prior to Admission medications   ?Medication Sig Start Date End Date Taking? Authorizing Provider  ?CIALIS 20 MG tablet TAKE 1 TABLET BY MOUTH DAILY AS NEEDED 04/23/12   Biagio Borg, MD  ?gabapentin (NEURONTIN) 600 MG tablet Take 1 tablet (600 mg total) by mouth 4 (four) times daily. 08/15/21   Pieter Partridge, DO  ?ondansetron (ZOFRAN-ODT) 4 MG disintegrating tablet Take 1 tablet (4 mg total) by mouth every 8 (eight) hours as needed for nausea or vomiting. 03/12/22   Biagio Borg, MD  ? ? ?Physical Exam: ?Vitals:  ? 03/17/22 2145 03/17/22 2200 03/17/22 2215 03/17/22 2230  ?BP: 122/82 124/86 127/80 139/87  ?Pulse: 68 61 68 70  ?Resp: '12 17 20 10  '$ ?Temp:      ?TempSrc:      ?SpO2: 100% 100% 100% 100%  ?Weight:      ?Height:      ? ? ?Physical Exam ?Vitals reviewed.  ?Constitutional:   ?   General: He is not in acute distress. ?HENT:  ?   Head: Normocephalic and atraumatic.  ?Cardiovascular:  ?   Rate and Rhythm: Tachycardia present. Rhythm irregular.  ?   Pulses: Normal pulses.  ?Pulmonary:  ?   Effort: Pulmonary effort is normal. No respiratory distress.  ?   Breath sounds: Normal breath sounds. No wheezing or rales.  ?Abdominal:  ?   General: Bowel sounds are normal. There is no distension.  ?   Palpations: Abdomen is soft.  ?   Tenderness: There is no abdominal tenderness. There is no guarding or rebound.  ?Musculoskeletal:     ?   General: No swelling or tenderness.  ?   Cervical back: Normal range of motion and neck supple.  ?Skin: ?   General: Skin is warm and dry.  ?Neurological:  ?   General: No focal deficit present.  ?   Mental Status: He is alert and oriented to person, place, and time.  ?  ? ?Labs on Admission: I have personally reviewed following labs and imaging studies ? ?CBC: ?Recent Labs  ?Lab 03/12/22 ?1158 03/17/22 ?1809  ?WBC 11.0* 11.9*  ?NEUTROABS 8.6*  --   ?HGB 10.7* 13.3  ?HCT 33.5* 40.5  ?MCV 84.7 83.3  ?PLT 234.0 204   ? ?Basic Metabolic Panel: ?Recent Labs  ?Lab 03/12/22 ?1158 03/17/22 ?1809 03/17/22 ?1828  ?NA 138 135  --   ?K 5.3* 3.9  --   ?CL 101 99  --   ?CO2 26 22  --   ?GLUCOSE 133* 94  --   ?BUN 17 22  --   ?CREATININE 1.21 1.20  --   ?CALCIUM 10.0 9.4  --   ?  MG  --   --  2.1  ? ?GFR: ?Estimated Creatinine Clearance: 52.4 mL/min (by C-G formula based on SCr of 1.2 mg/dL). ?Liver Function Tests: ?Recent Labs  ?Lab 03/12/22 ?1158 03/17/22 ?1809  ?AST 36 26  ?ALT 61* 29  ?ALKPHOS 128* 80  ?BILITOT 0.9 1.4*  ?PROT 7.5 7.2  ?ALBUMIN 4.4 4.1  ? ?Recent Labs  ?Lab 03/12/22 ?1158 03/17/22 ?1809  ?LIPASE 4.0* 24  ? ?No results for input(s): AMMONIA in the last 168 hours. ?Coagulation Profile: ?No results for input(s): INR, PROTIME in the last 168 hours. ?Cardiac Enzymes: ?No results for input(s): CKTOTAL, CKMB, CKMBINDEX, TROPONINI in the last 168 hours. ?BNP (last 3 results) ?No results for input(s): PROBNP in the last 8760 hours. ?HbA1C: ?No results for input(s): HGBA1C in the last 72 hours. ?CBG: ?No results for input(s): GLUCAP in the last 168 hours. ?Lipid Profile: ?No results for input(s): CHOL, HDL, LDLCALC, TRIG, CHOLHDL, LDLDIRECT in the last 72 hours. ?Thyroid Function Tests: ?No results for input(s): TSH, T4TOTAL, FREET4, T3FREE, THYROIDAB in the last 72 hours. ?Anemia Panel: ?No results for input(s): VITAMINB12, FOLATE, FERRITIN, TIBC, IRON, RETICCTPCT in the last 72 hours. ?Urine analysis: ?   ?Component Value Date/Time  ? COLORURINE YELLOW 03/12/2022 1158  ? APPEARANCEUR CLEAR 03/12/2022 1158  ? LABSPEC 1.015 03/12/2022 1158  ? PHURINE 7.0 03/12/2022 1158  ? GLUCOSEU NEGATIVE 03/12/2022 1158  ? HGBUR NEGATIVE 03/12/2022 1158  ? BILIRUBINUR NEGATIVE 03/12/2022 1158  ? KETONESUR TRACE (A) 03/12/2022 1158  ? PROTEINUR 30 (A) 01/02/2022 8657  ? UROBILINOGEN 0.2 03/12/2022 1158  ? NITRITE NEGATIVE 03/12/2022 1158  ? LEUKOCYTESUR NEGATIVE 03/12/2022 1158  ? ? ?Radiological Exams on Admission: I have personally reviewed  images ?No results found. ? ?EKG: Independently reviewed.  Atrial flutter, LAFB, RBBB.  Atrial flutter new since prior tracing. ? ?Assessment and Plan: ?* Atrial flutter with rapid ventricular response ([place]) ?New on

## 2022-03-17 NOTE — Assessment & Plan Note (Addendum)
Likely due to demand ischemia from new onset atrial flutter.  Troponin mildly elevated but improved compared to prior labs. ?--Continue to monitor on telemetry ?

## 2022-03-18 ENCOUNTER — Other Ambulatory Visit: Payer: Self-pay | Admitting: Neurology

## 2022-03-18 ENCOUNTER — Inpatient Hospital Stay (HOSPITAL_COMMUNITY): Payer: Federal, State, Local not specified - PPO

## 2022-03-18 DIAGNOSIS — I4892 Unspecified atrial flutter: Secondary | ICD-10-CM | POA: Diagnosis not present

## 2022-03-18 LAB — LIPID PANEL
Cholesterol: 161 mg/dL (ref 0–200)
HDL: 45 mg/dL (ref >40)
LDL Cholesterol: 93 mg/dL (ref 0–99)
Total CHOL/HDL Ratio: 3.6 RATIO
Triglycerides: 115 mg/dL (ref <150)
VLDL: 23 mg/dL (ref 0–40)

## 2022-03-18 LAB — ECHOCARDIOGRAM COMPLETE
Calc EF: 47.2 %
Height: 66 in
Radius: 0.3 cm
S' Lateral: 3.4 cm
Single Plane A2C EF: 46.9 %
Single Plane A4C EF: 46.4 %
Weight: 2567.92 oz

## 2022-03-18 LAB — URINALYSIS, ROUTINE W REFLEX MICROSCOPIC
Bacteria, UA: NONE SEEN
Bilirubin Urine: NEGATIVE
Glucose, UA: NEGATIVE mg/dL
Hgb urine dipstick: NEGATIVE
Ketones, ur: 20 mg/dL — AB
Leukocytes,Ua: NEGATIVE
Nitrite: NEGATIVE
Protein, ur: 30 mg/dL — AB
Specific Gravity, Urine: 1.017 (ref 1.005–1.030)
pH: 6 (ref 5.0–8.0)

## 2022-03-18 LAB — RESP PANEL BY RT-PCR (FLU A&B, COVID) ARPGX2
Influenza A by PCR: NEGATIVE
Influenza B by PCR: NEGATIVE
SARS Coronavirus 2 by RT PCR: NEGATIVE

## 2022-03-18 LAB — TSH: TSH: 1.752 u[IU]/mL (ref 0.350–4.500)

## 2022-03-18 LAB — RAPID URINE DRUG SCREEN, HOSP PERFORMED
Amphetamines: NOT DETECTED
Barbiturates: NOT DETECTED
Benzodiazepines: NOT DETECTED
Cocaine: NOT DETECTED
Opiates: NOT DETECTED
Tetrahydrocannabinol: POSITIVE — AB

## 2022-03-18 MED ORDER — APIXABAN 5 MG PO TABS
5.0000 mg | ORAL_TABLET | Freq: Two times a day (BID) | ORAL | Status: DC
Start: 2022-03-18 — End: 2022-03-20
  Administered 2022-03-18 – 2022-03-20 (×5): 5 mg via ORAL
  Filled 2022-03-18 (×5): qty 1

## 2022-03-18 MED ORDER — MELATONIN 3 MG PO TABS
3.0000 mg | ORAL_TABLET | Freq: Once | ORAL | Status: AC
Start: 2022-03-18 — End: 2022-03-18
  Administered 2022-03-18: 3 mg via ORAL
  Filled 2022-03-18: qty 1

## 2022-03-18 MED ORDER — DILTIAZEM HCL 60 MG PO TABS
60.0000 mg | ORAL_TABLET | Freq: Four times a day (QID) | ORAL | Status: DC
  Administered 2022-03-18 – 2022-03-19 (×3): 60 mg via ORAL
  Filled 2022-03-18 (×3): qty 1

## 2022-03-18 NOTE — Telephone Encounter (Signed)
Enough given until appt with Dr.Jaffe 

## 2022-03-18 NOTE — Consult Note (Addendum)
?Cardiology Consultation:  ? ?Patient ID: Darrell Hoffman. ?MRN: [id number]; DOB: 10-18-1951 ? ?Admit date: 03/17/2022 ?Date of Consult: 03/18/2022 ? ?PCP:  Biagio Borg, MD ?  ?[facility] Providers ?Cardiologist:  Dr. Gasper Sells, [facility] }   ? ? ?Patient Profile:  ? ?Darrell Hoffman. is a 71 y.o. male with a hx of HTN, HLD, CKD III, chronic marijuana abuse,  idiopathic polyneuropathy, GERD, chronic pain syndrome, s/p hernia repair 1/22,  who is being seen 03/18/2022 for the evaluation of atrial flutter with RVR at the request of Dr [place] ? ?History of Present Illness:  ? ?[name] with above PMH presented to ER yesterday with c/o severe nausea and vomiting. He has hx of similar complaints and was felt due to cannabis hyperemesis syndrome by GI. He was seen by PCP 03/12/22 for same complaint, had CT completed 03/13/22 gallbladder wall thickening and pericholecystic fluid. No ?gallstones or biliary ductal dilatation. Findings are concerning for acute cholecystitis, although nonspecific in the setting of ascites. Abdomen ultrasound from ED on 03/17/22 showed no acute findings. He states he had severe N/V since last Sunday, has stopped marijuana use 1-2 weeks ago.  ? ?During ED evaluation, he was found to be tachycardiac with HR up to 140s. He denied any chest pain, heart palpitation/flutter sensation, dizziness, syncope, SOB, orthopnea, weight gain. He monitors BP at home and it usually runs around 120/70s and is not needing any antihypertensive. He is wondering if he had stroke in the past because his gait is unsteady since his hernia surgery. EKG revealed new onset of atrial flutter with RVR. He was given adenosine, metoprolol, Cardizem bolus and started on Cardizem gtt subsequently at ED. He was also started on Eliquis for anticoagulation.  He denied any hx of major ICH or GI bleed. Work up otherwise revealed unremarkable CMP. CBC with leukocytosis 11.9k. Hs Trop 48 >45. UA + ketones and protein, UDS + THC.  Cardiology is consulted for further input on Atrial flutter.   ? ?He follows Dr Gasper Sells outpatient, last seen on 11/21/2020 for pre-operative clearance for hernia repair, had RCRI 0 and DASI of 15 with 4.64 METs, was felt medically appropriate for surgery without further cardiac testing. He was told to monitor BP outpatient and is not currently on any anti-hypertensive. He has no previous hx of a fib or a flutter. He was hospitalized 12/2021 for Intractable nausea and vomiting, an Echo was done on 01/03/22 due to elevated trop 70-80s and BNP 538 and showed EF 55-60%, no RWMA, mild concentric LVH, grade I DD, RV normal, trivial MR. Echo was repeated today which showed EF 50-55%, LV low normal function, no RWMA, grade I DD, RV normal, normal PASP, LA moderately dilated, mild MR, moderate TR. ? ? ? ?Past Medical History:  ?Diagnosis Date  ? ALCOHOL ABUSE, HX OF 10/03/2007  ? ALLERGIC RHINITIS 10/03/2007  ? ANEMIA-B12 DEFICIENCY 12/04/2009  ? ANEMIA-IRON DEFICIENCY 11/27/2009  ? ANEMIA-NOS 10/08/2007  ? ASTHMA 10/03/2007  ? Asthma   ? B12 DEFICIENCY 10/06/2008  ? CKD (chronic kidney disease) stage 3, GFR 30-59 ml/min (HCC) 03/31/2018  ? COLONIC POLYPS, HX OF 10/03/2007  ? ERECTILE DYSFUNCTION 10/03/2007  ? GERD 10/03/2007  ? History of hiatal hernia   ? found on CXT in June 2021  ? HYPERLIPIDEMIA 10/03/2007  ? HYPERTENSION 10/03/2007  ? KNEE PAIN, RIGHT 10/08/2007  ? Morbid obesity ([place]) 10/03/2007  ? Neuromuscular disorder ([place])   ? per pt. never diagnosed  ? Substance abuse (  [place])   ? marijuana daily  ? ? ?Past Surgical History:  ?Procedure Laterality Date  ? bilateral hydrocele repair    ? COLONOSCOPY  06/06/2016  ? POLYPECTOMY  01-03-2010  ? s/p left knee arthroscopy  2009  ? s/p TKR  03/2009  ? VASECTOMY    ? XI ROBOTIC ASSISTED HIATAL HERNIA REPAIR N/A 01/04/2021  ? Procedure: XI ROBOTIC ASSISTED HIATAL HERNIA REPAIR AND FUNDOPLICATION WITH MESH;  Surgeon: Ralene Ok, MD;  Location: [facility];  Service: General;   Laterality: N/A;  ?  ? ?Home Medications:  ?Prior to Admission medications   ?Medication Sig Start Date End Date Taking? Authorizing Provider  ?CIALIS 20 MG tablet TAKE 1 TABLET BY MOUTH DAILY AS NEEDED ?Patient taking differently: Take 20 mg by mouth daily as needed for erectile dysfunction. 04/23/12  Yes Biagio Borg, MD  ?ondansetron (ZOFRAN-ODT) 4 MG disintegrating tablet Take 1 tablet (4 mg total) by mouth every 8 (eight) hours as needed for nausea or vomiting. 03/12/22  Yes Biagio Borg, MD  ?gabapentin (NEURONTIN) 600 MG tablet TAKE 1 TABLET BY MOUTH FOUR TIMES DAILY 03/18/22   Pieter Partridge, DO  ? ? ?Inpatient Medications: ?Scheduled Meds: ? apixaban  5 mg Oral BID  ? pantoprazole (PROTONIX) IV  40 mg Intravenous Q24H  ? ?Continuous Infusions: ? diltiazem (CARDIZEM) infusion 15 mg/hr (03/18/22 1206)  ? ?PRN Meds: ?acetaminophen **OR** acetaminophen, ondansetron (ZOFRAN) IV ? ?Allergies:   No Known Allergies ? ?Social History:   ?Social History  ? ?Socioeconomic History  ? Marital status: Divorced  ?  Spouse name: Not on file  ? Number of children: 2  ? Years of education: Not on file  ? Highest education level: Not on file  ?Occupational History  ? Occupation: Event organiser   ?  Employer: [employer]  ?Tobacco Use  ? Smoking status: Some Days  ? Smokeless tobacco: Never  ?Vaping Use  ? Vaping Use: Never used  ?Substance and Sexual Activity  ? Alcohol use: Not Currently  ?  Alcohol/week: 0.0 standard drinks  ? Drug use: Yes  ?  Types: Marijuana  ?  Comment: marijuana daily  ? Sexual activity: Not on file  ?Other Topics Concern  ? Not on file  ?Social History Narrative  ? Right handed  ? One story home  ? No caffeine  ? ?Social Determinants of Health  ? ?Financial Resource Strain: Not on file  ?Food Insecurity: Not on file  ?Transportation Needs: Not on file  ?Physical Activity: Not on file  ?Stress: Not on file  ?Social Connections: Not on file  ?Intimate Partner Violence: Not on file  ?  ?Family  History:   ? ?Family History  ?Problem Relation Age of Onset  ? Breast cancer Mother   ? Lupus Sister   ? Hypertension Sister   ? Diabetes Sister   ? Alcohol abuse Paternal Uncle   ? Coronary artery disease Other   ?     male 1st degree relative - MI at 70 yo  ? Diabetes Cousin   ?     2 cousins  ? Colon cancer Neg Hx   ? Esophageal cancer Neg Hx   ? Rectal cancer Neg Hx   ? Stomach cancer Neg Hx   ? Colon polyps Neg Hx   ?  ? ?ROS:  ?Constitutional: Denied fever, chills, malaise, night sweats ?Ears/Nose/Mouth/Throat: Denied ear ache, sore throat, coughing, sinus pain ?Cardiovascular: see HPI  ?Respiratory: denied shortness of  breath ?Gastrointestinal: see HPI ?Genital/Urinary: Denied dysuria, hematuria, urinary frequency/urgency ?Musculoskeletal: Denied muscle ache, joint pain, weakness ?Skin: Denied rash, wound ?Neuro: imbalanced gait ?Psych: Denied history of depression/anxiety  ?Endocrine: Denied history of diabetes ? ? ?Physical Exam/Data:  ? ?Vitals:  ? 03/18/22 0545 03/18/22 0600 03/18/22 0830 03/18/22 1205  ?BP: 134/90 128/86 (!) 135/91 (!) 148/88  ?Pulse: 92 79 70 70  ?Resp: '10 14 12 14  '$ ?Temp:      ?TempSrc:      ?SpO2: 100% 100% 100% 100%  ?Weight:      ?Height:      ? ? ?Intake/Output Summary (Last 24 hours) at 03/18/2022 1343 ?Last data filed at 03/18/2022 1159 ?Gross per 24 hour  ?Intake 1125 ml  ?Output --  ?Net 1125 ml  ? ? ?  03/17/2022  ?  8:05 PM 03/12/2022  ? 11:02 AM 02/19/2022  ?  9:12 AM  ?Last 3 Weights  ?Weight (lbs) 160 lb 7.9 oz 160 lb 9.6 oz 156 lb 3.2 oz  ?Weight (kg) 72.8 kg 72.848 kg 70.852 kg  ?   ?Body mass index is 25.9 kg/m?.  ? ?Vitals:  ?Vitals:  ? 03/18/22 0830 03/18/22 1205  ?BP: (!) 135/91 (!) 148/88  ?Pulse: 70 70  ?Resp: 12 14  ?Temp:    ?SpO2: 100% 100%  ? ? ?General Appearance: In no apparent distress, laying in bed ?HEENT: Normocephalic, atraumatic. ?Neck: Supple, trachea midline, no JVDs ?Cardiovascular: Irregularly irregular, no murmur  ?Respiratory: Resting breathing  unlabored, lungs sounds clear to auscultation bilaterally, no use of accessory muscles. On room air.   ?Gastrointestinal: Bowel sounds positive, abdomen soft, non-tender ?Extremities: Able to move all extremities in be

## 2022-03-18 NOTE — Progress Notes (Signed)
?  Echocardiogram ?2D Echocardiogram has been performed. ? ?Darrell Hoffman ?03/18/2022, 10:31 AM ?

## 2022-03-18 NOTE — Hospital Course (Signed)
Darrell Hoffman. is a 71 year old male with past medical history significant for idiopathic polyneuropathy, essential hypertension, hyperlipidemia, GERD, chronic pain syndrome, chronic marijuana abuse, history of hiatal hernia s/p repair January 2022 who presented to [facility] ED on 4/2 complaining of nausea and vomiting.  Patient with multiple admissions for intractable nausea and vomiting felt to be secondary to cannabinoid hyperemesis syndrome.  He was seen by gastroenterology on 02/19/2022 and was started on Protonix 40 mg p.o. daily as he had evidence of distal esophagitis on prior CT.  He was seen again by his PCP on 03/12/2022 for nausea and vomiting and exam concerning for supraumbilical hernia and was referred to general surgery.  CT done the following day showed gallbladder wall thickening and pericholecystic fluid without gallstones or biliary ductal dilation. ? ?In the ED, temperature 98.5 ?F, HR 146, RR 14, BP 149/110, SPO2 100% on room air.  Sodium 135, potassium 3.9, chloride 99, CO2 22, glucose 94, BUN 22, creatinine 1.20.  Lipase 24, AST 26, ALT 29, total bilirubin 1.4.  WBC 11.9, hemoglobin 13.3, platelets 204.  High sensitive troponin 48> 45.  Urinalysis unrevealing.  UDS positive for THC.  EKG remarkable for atrial flutter with RVR with 2-1 AV block, right bundle blanch block, rate 146.  EDP administered adenosine, metoprolol, Cardizem bolus followed by continuous Cardizem infusion.  Patient was also given 1 dose of Eliquis and 1 L IV fluid bolus.  TRH consulted for further evaluation and management of atrial flutter with RVR which is new onset and intractable nausea/vomiting likely secondary to chronic marijuana abuse. ? ?

## 2022-03-18 NOTE — Progress Notes (Signed)
Called cardiology PA about transition patient to PO cardiziem because patient HR still up in 130s-140s with activities. PA okay to give patient PO cardiziem and titrate down cardizien gtt qh after PO med given. Patient HR  still sustain 100-110 at rest. Report given to night RN. ?

## 2022-03-18 NOTE — Progress Notes (Signed)
?PROGRESS NOTE ? ? ? ?Darrell Hoffman.  WVP:[id number] DOB: 08-02-1951 DOA: 03/17/2022 ?PCP: Darrell Borg, MD  ? ? ?Brief Narrative:  ?Darrell Ord. is a 71 year old male with past medical history significant for idiopathic polyneuropathy, essential hypertension, hyperlipidemia, GERD, chronic pain syndrome, chronic marijuana abuse, history of hiatal hernia s/p repair January 2022 who presented to [facility] ED on 4/2 complaining of nausea and vomiting.  Patient with multiple admissions for intractable nausea and vomiting felt to be secondary to cannabinoid hyperemesis syndrome.  He was seen by gastroenterology on 02/19/2022 and was started on Protonix 40 mg p.o. daily as he had evidence of distal esophagitis on prior CT.  He was seen again by his PCP on 03/12/2022 for nausea and vomiting and exam concerning for supraumbilical hernia and was referred to general surgery.  CT done the following day showed gallbladder wall thickening and pericholecystic fluid without gallstones or biliary ductal dilation. ? ?In the ED, temperature 98.5 ?F, HR 146, RR 14, BP 149/110, SPO2 100% on room air.  Sodium 135, potassium 3.9, chloride 99, CO2 22, glucose 94, BUN 22, creatinine 1.20.  Lipase 24, AST 26, ALT 29, total bilirubin 1.4.  WBC 11.9, hemoglobin 13.3, platelets 204.  High sensitive troponin 48> 45.  Urinalysis unrevealing.  UDS positive for THC.  EKG remarkable for atrial flutter with RVR with 2-1 AV block, right bundle blanch block, rate 146.  EDP administered adenosine, metoprolol, Cardizem bolus followed by continuous Cardizem infusion.  Patient was also given 1 dose of Eliquis and 1 L IV fluid bolus.  TRH consulted for further evaluation and management of atrial flutter with RVR which is new onset and intractable nausea/vomiting likely secondary to chronic marijuana abuse. ? ?  ?Assessment and Plan: ?* Atrial flutter with rapid ventricular response (HCC) ?Upon ED presentation, patient was noted to be in a flutter with RVR with  rates in the 140s.  Patient was given adenosine, metoprolol, IV Cardizem bolus and started on a continuous infusion.  CHA2DS2-VASc score = 2.  New onset.  UDS positive for THC. ?--Cardiology consulted ?--TTE: Pending ?--TSH: Pending ?--Lipid panel: Pending ?--Cardizem drip ?--Eliquis 5 mg p.o. twice daily ?--Continue to monitor on telemetry ? ?Intractable nausea and vomiting ?This is an ongoing problem in the setting of chronic marijuana use.  CT done in January 2023 showing evidence of distal esophagitis and was started on PPI by gastroenterology.  Recently seen by PCP for the same problem and CT done 3/28 showing gallbladder wall thickening and pericholecystic fluid; no gallstones or biliary ductal dilatation.  Findings concerning for acute cholecystitis, although nonspecific in the setting of small volume ascites throughout the abdomen and pelvis.  Labs done today showing borderline stable leukocytosis and no significant elevation of liver enzymes.  Lipase normal.  He is not febrile.  Not endorsing abdominal pain and exam benign.  Urinalysis unrevealing.  Right upper quadrant ultrasound with no gallbladder wall thickening, no pericholecystic fluid, no visualized stones, no sonographic Murphy sign. ?--Continue IV fluid hydration ?--Antiemetic as needed ?--IV Protonix 40 mg daily ?--Counseled on THC cessation ? ?Elevated troponin ?Likely due to demand ischemia from new onset atrial flutter.  Troponin mildly elevated but improved compared to prior labs. ?--Continue to monitor on telemetry ? ?Essential hypertension ?Not on any antihypertensives at home.  Blood pressure currently stable. ?--On Cardizem drip for new onset atrial flutter ? ?Tetrahydrocannabinol (THC) use disorder, moderate, dependence ([place]) ?UDS positive for THC.  Longstanding history of THC abuse likely  causing cannabinoid hyperemesis syndrome.  Counseled on need for cessation. ? ? ? ? ? ?DVT prophylaxis:  ?apixaban (ELIQUIS) tablet 5 mg  ?  Code  Status: Full Code ?Family Communication: Updated family present at bedside this morning ? ?Disposition Plan:  ?Level of care: Progressive ?Status is: Inpatient ?Remains inpatient appropriate because: Continues on Cardizem drip, pending TEE and cardiac dilation ?  ? ?Consultants:  ?Cardiology ? ?Procedures:  ?TTE: Pending ?Right upper quadrant ultrasound ? ?Antimicrobials:  ?None ? ? ?Subjective: ?Patient seen and examined at bedside, resting comfortably.  Family present.  No specific complaints this morning.  Remains on IV Cardizem drip.  Currently denies headache, no vision changes, no chest pain, no palpitations, no shortness of breath, no abdominal pain, no nausea currently, no vomiting/diarrhea, no fever/chills/night sweats, no weakness, no fatigue, no paresthesias.  No acute events overnight per nursing staff. ? ?Objective: ?Vitals:  ? 03/18/22 0515 03/18/22 0530 03/18/22 0545 03/18/22 0600  ?BP: 134/85 134/89 134/90 128/86  ?Pulse: 92 91 92 79  ?Resp: '13 10 10 14  '$ ?Temp:      ?TempSrc:      ?SpO2: 99% 100% 100% 100%  ?Weight:      ?Height:      ? ? ?Intake/Output Summary (Last 24 hours) at 03/18/2022 0856 ?Last data filed at 03/18/2022 0020 ?Gross per 24 hour  ?Intake 1000 ml  ?Output --  ?Net 1000 ml  ? ?Filed Weights  ? 03/17/22 2005  ?Weight: 72.8 kg  ? ? ?Examination: ? ?Physical Exam: ?GEN: NAD, alert and oriented x 3, wd/wn ?HEENT: NCAT, PERRL, EOMI, sclera clear, MMM ?PULM: CTAB w/o wheezes/crackles, normal respiratory effort, on room air ?CV: Irregularly irregular rhythm, tachycardic w/o M/G/R ?GI: abd soft, NTND, NABS, no R/G/M ?MSK: no peripheral edema, muscle strength globally intact 5/5 bilateral upper/lower extremities ?NEURO: CN II-XII intact, no focal deficits, sensation to light touch intact ?PSYCH: normal mood/affect ?Integumentary: dry/intact, no rashes or wounds ? ? ? ?Data Reviewed: I have personally reviewed following labs and imaging studies ? ?CBC: ?Recent Labs  ?Lab 03/12/22 ?1158  03/17/22 ?1809  ?WBC 11.0* 11.9*  ?NEUTROABS 8.6*  --   ?HGB 10.7* 13.3  ?HCT 33.5* 40.5  ?MCV 84.7 83.3  ?PLT 234.0 204  ? ?Basic Metabolic Panel: ?Recent Labs  ?Lab 03/12/22 ?1158 03/17/22 ?1809 03/17/22 ?1828  ?NA 138 135  --   ?K 5.3* 3.9  --   ?CL 101 99  --   ?CO2 26 22  --   ?GLUCOSE 133* 94  --   ?BUN 17 22  --   ?CREATININE 1.21 1.20  --   ?CALCIUM 10.0 9.4  --   ?MG  --   --  2.1  ? ?GFR: ?Estimated Creatinine Clearance: 52.4 mL/min (by C-G formula based on SCr of 1.2 mg/dL). ?Liver Function Tests: ?Recent Labs  ?Lab 03/12/22 ?1158 03/17/22 ?1809  ?AST 36 26  ?ALT 61* 29  ?ALKPHOS 128* 80  ?BILITOT 0.9 1.4*  ?PROT 7.5 7.2  ?ALBUMIN 4.4 4.1  ? ?Recent Labs  ?Lab 03/12/22 ?1158 03/17/22 ?1809  ?LIPASE 4.0* 24  ? ?No results for input(s): AMMONIA in the last 168 hours. ?Coagulation Profile: ?No results for input(s): INR, PROTIME in the last 168 hours. ?Cardiac Enzymes: ?No results for input(s): CKTOTAL, CKMB, CKMBINDEX, TROPONINI in the last 168 hours. ?BNP (last 3 results) ?No results for input(s): PROBNP in the last 8760 hours. ?HbA1C: ?No results for input(s): HGBA1C in the last 72 hours. ?CBG: ?No results for  input(s): GLUCAP in the last 168 hours. ?Lipid Profile: ?No results for input(s): CHOL, HDL, LDLCALC, TRIG, CHOLHDL, LDLDIRECT in the last 72 hours. ?Thyroid Function Tests: ?No results for input(s): TSH, T4TOTAL, FREET4, T3FREE, THYROIDAB in the last 72 hours. ?Anemia Panel: ?No results for input(s): VITAMINB12, FOLATE, FERRITIN, TIBC, IRON, RETICCTPCT in the last 72 hours. ?Sepsis Labs: ?No results for input(s): PROCALCITON, LATICACIDVEN in the last 168 hours. ? ?No results found for this or any previous visit (from the past 240 hour(s)).  ? ? ? ? ? ?Radiology Studies: ?US Abdomen Limited RUQ (LIVER/GB) ? ?Result Date: 03/17/2022 ?CLINICAL DATA:  Intractable nausea, vomiting EXAM: ULTRASOUND ABDOMEN LIMITED RIGHT UPPER QUADRANT COMPARISON:  CT 03/13/2022 FINDINGS: Gallbladder: No visible stones or  wall thickening. Negative sonographic Murphy sign. Common bile duct: Diameter: Normal caliber, 5 mm Liver: No focal lesion identified. Within normal limits in parenchymal echogenicity. Portal vein is patent on color D

## 2022-03-18 NOTE — Assessment & Plan Note (Signed)
UDS positive for THC.  Longstanding history of THC abuse likely causing cannabinoid hyperemesis syndrome.  Counseled on need for cessation. ?

## 2022-03-19 ENCOUNTER — Other Ambulatory Visit (HOSPITAL_COMMUNITY): Payer: Self-pay

## 2022-03-19 DIAGNOSIS — I4892 Unspecified atrial flutter: Secondary | ICD-10-CM | POA: Diagnosis not present

## 2022-03-19 DIAGNOSIS — R5381 Other malaise: Secondary | ICD-10-CM

## 2022-03-19 LAB — CBC
HCT: 37.4 % — ABNORMAL LOW (ref 39.0–52.0)
Hemoglobin: 12.4 g/dL — ABNORMAL LOW (ref 13.0–17.0)
MCH: 27.5 pg (ref 26.0–34.0)
MCHC: 33.2 g/dL (ref 30.0–36.0)
MCV: 82.9 fL (ref 80.0–100.0)
Platelets: 205 10*3/uL (ref 150–400)
RBC: 4.51 MIL/uL (ref 4.22–5.81)
RDW: 16.1 % — ABNORMAL HIGH (ref 11.5–15.5)
WBC: 9.1 10*3/uL (ref 4.0–10.5)
nRBC: 0 % (ref 0.0–0.2)

## 2022-03-19 LAB — BASIC METABOLIC PANEL
Anion gap: 13 (ref 5–15)
BUN: 15 mg/dL (ref 8–23)
CO2: 20 mmol/L — ABNORMAL LOW (ref 22–32)
Calcium: 8.5 mg/dL — ABNORMAL LOW (ref 8.9–10.3)
Chloride: 106 mmol/L (ref 98–111)
Creatinine, Ser: 1.02 mg/dL (ref 0.61–1.24)
GFR, Estimated: >60 mL/min (ref >60)
Glucose, Bld: 83 mg/dL (ref 70–99)
Potassium: 3.8 mmol/L (ref 3.5–5.1)
Sodium: 139 mmol/L (ref 135–145)

## 2022-03-19 LAB — MAGNESIUM: Magnesium: 2.1 mg/dL (ref 1.7–2.4)

## 2022-03-19 MED ORDER — DILTIAZEM HCL 60 MG PO TABS
60.0000 mg | ORAL_TABLET | Freq: Once | ORAL | Status: AC
  Administered 2022-03-19: 60 mg via ORAL
  Filled 2022-03-19: qty 1

## 2022-03-19 MED ORDER — DILTIAZEM HCL 60 MG PO TABS: 120.0000 mg | ORAL_TABLET | Freq: Four times a day (QID) | ORAL | Status: DC

## 2022-03-19 MED ORDER — PANTOPRAZOLE SODIUM 40 MG PO TBEC
40.0000 mg | DELAYED_RELEASE_TABLET | Freq: Every day | ORAL | Status: DC
Start: 2022-03-19 — End: 2022-03-20
  Administered 2022-03-19 – 2022-03-20 (×2): 40 mg via ORAL
  Filled 2022-03-19 (×2): qty 1

## 2022-03-19 MED ORDER — DILTIAZEM HCL 60 MG PO TABS
90.0000 mg | ORAL_TABLET | Freq: Four times a day (QID) | ORAL | Status: DC
  Administered 2022-03-19 – 2022-03-20 (×4): 90 mg via ORAL
  Filled 2022-03-19 (×4): qty 1

## 2022-03-19 NOTE — TOC Progression Note (Signed)
Transition of Care (TOC) - Progression Note  ? ? ?Patient Details  ?Name: Darrell Hoffman. ?MRN: [id number] ?Date of Birth: 1951-01-27 ? ?Transition of Care (TOC) CM/SW Contact  ?Zenon Mayo, RN ?Phone Number: ?03/19/2022, 11:25 AM ? ?Clinical Narrative:    ?from home with girlfriend, indep, uses a cane, N/V, afib RVR, TOC will continue to follow for dc needs.  ? ? ?  ?  ? ?Expected Discharge Plan and Services ?  ?  ?  ?  ?  ?                ?  ?  ?  ?  ?  ?  ?  ?  ?  ?  ? ? ?Social Determinants of Health (SDOH) Interventions ?  ? ?Readmission Risk Interventions ? ?  01/03/2022  ? 12:35 PM  ?Readmission Risk Prevention Plan  ?Transportation Screening Complete  ?PCP or Specialist Appt within 3-5 Days Complete  ?[place] or Home Care Consult Complete  ?Social Work Consult for [place] Planning/Counseling Complete  ?Palliative Care Screening Complete  ?Medication Review Press photographer) Complete  ? ? ?

## 2022-03-19 NOTE — Progress Notes (Signed)
?PROGRESS NOTE ? ? ? ?Darrell Hoffman.  GNF:[id number] DOB: 05/20/1951 DOA: 03/17/2022 ?PCP: Biagio Borg, MD  ? ? ?Brief Narrative:  ?Darrell Hoffman. is a 71 year old male with past medical history significant for idiopathic polyneuropathy, essential hypertension, hyperlipidemia, GERD, chronic pain syndrome, chronic marijuana abuse, history of hiatal hernia s/p repair January 2022 who presented to [facility] ED on 4/2 complaining of nausea and vomiting.  Patient with multiple admissions for intractable nausea and vomiting felt to be secondary to cannabinoid hyperemesis syndrome.  He was seen by gastroenterology on 02/19/2022 and was started on Protonix 40 mg p.o. daily as he had evidence of distal esophagitis on prior CT.  He was seen again by his PCP on 03/12/2022 for nausea and vomiting and exam concerning for supraumbilical hernia and was referred to general surgery.  CT done the following day showed gallbladder wall thickening and pericholecystic fluid without gallstones or biliary ductal dilation. ? ?In the ED, temperature 98.5 ?F, HR 146, RR 14, BP 149/110, SPO2 100% on room air.  Sodium 135, potassium 3.9, chloride 99, CO2 22, glucose 94, BUN 22, creatinine 1.20.  Lipase 24, AST 26, ALT 29, total bilirubin 1.4.  WBC 11.9, hemoglobin 13.3, platelets 204.  High sensitive troponin 48> 45.  Urinalysis unrevealing.  UDS positive for THC.  EKG remarkable for atrial flutter with RVR with 2-1 AV block, right bundle blanch block, rate 146.  EDP administered adenosine, metoprolol, Cardizem bolus followed by continuous Cardizem infusion.  Patient was also given 1 dose of Eliquis and 1 L IV fluid bolus.  TRH consulted for further evaluation and management of atrial flutter with RVR which is new onset and intractable nausea/vomiting likely secondary to chronic marijuana abuse. ? ?  ?Assessment and Plan: ?* Atrial flutter with rapid ventricular response (HCC) ?Upon ED presentation, patient was noted to be in a flutter with RVR with  rates in the 140s.  Patient was given adenosine, metoprolol, IV Cardizem bolus and started on a continuous infusion.  CHA2DS2-VASc score = 2.  New onset.  UDS positive for THC.  TSH 1.752.  TTE with low normal LVEF 50-55%, no regional wall motion abnormalities, grade 1 diastolic dysfunction, mild MR, moderate TR, no aortic stenosis, IVC normal in size. ?--Cardiology following, appreciate assistance ?--Cardizem increased to '90mg'$  PO q6h today by cardiology ?--Eliquis 5 mg p.o. twice daily ?--Continue to monitor on telemetry ? ?Intractable nausea and vomiting ?This is an ongoing problem in the setting of chronic marijuana use.  CT done in January 2023 showing evidence of distal esophagitis and was started on PPI by gastroenterology.  Recently seen by PCP for the same problem and CT done 3/28 showing gallbladder wall thickening and pericholecystic fluid; no gallstones or biliary ductal dilatation.  Findings concerning for acute cholecystitis, although nonspecific in the setting of small volume ascites throughout the abdomen and pelvis.  Labs done today showing borderline stable leukocytosis and no significant elevation of liver enzymes.  Lipase normal.  He is not febrile.  Not endorsing abdominal pain and exam benign.  Urinalysis unrevealing.  Right upper quadrant ultrasound with no gallbladder wall thickening, no pericholecystic fluid, no visualized stones, no sonographic Murphy sign. ?--Tolerating diet ?--Antiemetic as needed ?--IV Protonix 40 mg daily ?--Counseled on THC cessation ? ?Elevated troponin ?Likely due to demand ischemia from new onset atrial flutter.  Troponin mildly elevated but improved compared to prior labs. ?--Continue to monitor on telemetry ? ?Essential hypertension ?Not on any antihypertensives at home.  Blood  pressure currently stable. ?--On Cardizem 9 mg p.o. every 6 hours as above ? ?Tetrahydrocannabinol (THC) use disorder, moderate, dependence ([place]) ?UDS positive for THC.  Longstanding history  of THC abuse likely causing cannabinoid hyperemesis syndrome.  Counseled on need for cessation. ? ?Physical deconditioning ?--PT/OT evaluation ? ? ? ? ? ?DVT prophylaxis:  ?apixaban (ELIQUIS) tablet 5 mg  ?  Code Status: Full Code ?Family Communication: No family present at bedside this morning ? ?Disposition Plan:  ?Level of care: Progressive ?Status is: Inpatient ?Remains inpatient appropriate because: Continues with poorly controlled heart rate, cardiology increased oral Cardizem today, awaiting PT/OT evaluation ?  ? ?Consultants:  ?Cardiology ? ?Procedures:  ?TTE:  ?Right upper quadrant ultrasound ? ?Antimicrobials:  ?None ? ? ?Subjective: ?Patient seen and examined at bedside, resting comfortably.  Lying in bed, no family present.  Eating breakfast.  Reporting tolerating diet without any further nausea/vomiting.  Heart rate remains poorly controlled on oral Cardizem, cardiology seen and increase Cardizem this morning.  No specific complaints or concerns this morning.  Remains on IV Cardizem drip.  Currently denies headache, no vision changes, no chest pain, no palpitations, no shortness of breath, no abdominal pain, no nausea currently, no vomiting/diarrhea, no fever/chills/night sweats, no weakness, no fatigue, no paresthesias.  No acute events overnight per nursing staff. ? ?Objective: ?Vitals:  ? 03/18/22 2356 03/19/22 0330 03/19/22 0354 03/19/22 0732  ?BP: 129/84 (!) 126/94  (!) 134/92  ?Pulse:  (!) 112  (!) 133  ?Resp: '14 18  20  '$ ?Temp: 98.2 ?F (36.8 ?C) 97.9 ?F (36.6 ?C)  97.8 ?F (36.6 ?C)  ?TempSrc: Oral Oral  Oral  ?SpO2: 100% 99%  97%  ?Weight:   65.1 kg   ?Height:      ? ? ?Intake/Output Summary (Last 24 hours) at 03/19/2022 0956 ?Last data filed at 03/19/2022 0800 ?Gross per 24 hour  ?Intake 580.83 ml  ?Output 1000 ml  ?Net -419.17 ml  ? ?Filed Weights  ? 03/17/22 2005 03/19/22 0354  ?Weight: 72.8 kg 65.1 kg  ? ? ?Examination: ? ?Physical Exam: ?GEN: NAD, alert and oriented x 3, wd/wn ?HEENT: NCAT,  PERRL, EOMI, sclera clear, MMM ?PULM: CTAB w/o wheezes/crackles, normal respiratory effort, on room air ?CV: Irregularly irregular rhythm, tachycardic w/o M/G/R ?GI: abd soft, NTND, NABS, no R/G/M ?MSK: no peripheral edema, muscle strength globally intact 5/5 bilateral upper/lower extremities ?NEURO: CN II-XII intact, no focal deficits, sensation to light touch intact ?PSYCH: normal mood/affect ?Integumentary: dry/intact, no rashes or wounds ? ? ? ?Data Reviewed: I have personally reviewed following labs and imaging studies ? ?CBC: ?Recent Labs  ?Lab 03/12/22 ?1158 03/17/22 ?1809 03/19/22 ?0429  ?WBC 11.0* 11.9* 9.1  ?NEUTROABS 8.6*  --   --   ?HGB 10.7* 13.3 12.4*  ?HCT 33.5* 40.5 37.4*  ?MCV 84.7 83.3 82.9  ?PLT 234.0 204 205  ? ?Basic Metabolic Panel: ?Recent Labs  ?Lab 03/12/22 ?1158 03/17/22 ?1809 03/17/22 ?1828 03/19/22 ?0429  ?NA 138 135  --  139  ?K 5.3* 3.9  --  3.8  ?CL 101 99  --  106  ?CO2 26 22  --  20*  ?GLUCOSE 133* 94  --  83  ?BUN 17 22  --  15  ?CREATININE 1.21 1.20  --  1.02  ?CALCIUM 10.0 9.4  --  8.5*  ?MG  --   --  2.1 2.1  ? ?GFR: ?Estimated Creatinine Clearance: 61.7 mL/min (by C-G formula based on SCr of 1.02 mg/dL). ?Liver Function Tests: ?  Recent Labs  ?Lab 03/12/22 ?1158 03/17/22 ?1809  ?AST 36 26  ?ALT 61* 29  ?ALKPHOS 128* 80  ?BILITOT 0.9 1.4*  ?PROT 7.5 7.2  ?ALBUMIN 4.4 4.1  ? ?Recent Labs  ?Lab 03/12/22 ?1158 03/17/22 ?1809  ?LIPASE 4.0* 24  ? ?No results for input(s): AMMONIA in the last 168 hours. ?Coagulation Profile: ?No results for input(s): INR, PROTIME in the last 168 hours. ?Cardiac Enzymes: ?No results for input(s): CKTOTAL, CKMB, CKMBINDEX, TROPONINI in the last 168 hours. ?BNP (last 3 results) ?No results for input(s): PROBNP in the last 8760 hours. ?HbA1C: ?No results for input(s): HGBA1C in the last 72 hours. ?CBG: ?No results for input(s): GLUCAP in the last 168 hours. ?Lipid Profile: ?Recent Labs  ?  03/18/22 ?1527  ?CHOL 161  ?HDL 45  ?Dexter 93  ?TRIG 115  ?CHOLHDL  3.6  ? ?Thyroid Function Tests: ?Recent Labs  ?  03/18/22 ?1527  ?TSH 1.752  ? ?Anemia Panel: ?No results for input(s): VITAMINB12, FOLATE, FERRITIN, TIBC, IRON, RETICCTPCT in the last 72 hours. ?Sepsis La

## 2022-03-19 NOTE — Assessment & Plan Note (Signed)
--  PT/OT evaluation 

## 2022-03-19 NOTE — Progress Notes (Signed)
Dr. Stanford Breed notified of pt HR sustaining in 130s at rest.  ?

## 2022-03-19 NOTE — TOC Benefit Eligibility Note (Signed)
Patient Advocate Encounter ? ?Insurance verification completed.   ? ?The patient is currently admitted and upon discharge could be taking Eliquis 5 mg. ? ?The current 30 day co-pay is, $85.02.  ? ?The patient is insured through [employer]  ? ? ? ?Lyndel Safe, CPhT ?Pharmacy Patient Advocate Specialist ?[place] Patient Advocate Team ?Direct Number: [phone]  Fax: [phone] ? ? ? ? ? ?  ?

## 2022-03-19 NOTE — Progress Notes (Signed)
Primary RN discussed plan to transition pt from IV to PO cardizem with Dr. Debara Pickett. Clarification of plan received. Cardizem gtt stopped at 2001. Pt HR 110-116 BP 147/90. ?

## 2022-03-19 NOTE — Discharge Instructions (Signed)

## 2022-03-19 NOTE — Consult Note (Signed)
? ?  [facility] CM Inpatient Consult ? ? ?03/19/2022 ? ?Darrell Hoffman. ?Oct 29, 1951 ?[id number] ? ?[facility] Customer service manager [[employer]]  Accountable Care Organization [ACO] Patient: [name] PPO ? ?Primary Care Provider:  [name], MD,  [facility] ?  is an embedded provider with a Chronic Care Management team and program, and is listed for the transition of care follow up and appointments. ? ?Patient was screened for Embedded practice service needs for chronic care management for post hospital follow up needs. ? ?Discussed in unit progression meeting. Met with the patient at the bedside and explained he could be eligible for Chronic Care Management follow up. ? ?Plan: Continue to follow.  A referral request can be sent to the [facility] Embedded Chronic Care Management for post hospital needs. He states he had no problem with SDOH.  I f he has issues with co-pays with new VTE prescriptions he could have CCM pharmacist to follow up. ? ?Please contact for further questions, ? ?Charlesetta Shanks, RN BSN CCM ?[facility] [employer] Liaison ? [phone] business mobile phone ?Toll free office [phone]  ?Fax number: [phone] ?[email or web address] ?[email or web address] ? ? ? ?

## 2022-03-19 NOTE — Evaluation (Addendum)
Occupational Therapy Evaluation ?Patient Details ?Name: Darrell Hoffman. ?MRN: [id number] ?DOB: [birth year] ?Today's Date: 03/19/2022 ? ? ?History of Present Illness 71 y.o. male presenting to the ED with nausea and vomiting.  Found to be in new onset atrial flutter with rate in the 140s. Recent admission in January 2023 for intractable nausea and vomiting felt to be secondary to cannabinoid hyperemesis syndrome. PMH significant of idiopathic polyneuropathy, chronic pain syndrome, hypertension, hyperlipidemia, GERD, chronic marijuana use, and s/p hiatal hernia repair surgery January 2022.  ? ?Clinical Impression ?  ?PTA, pt was living with his wife and was independent. Pt currently requiring Min guard A for ADLs and functional mobility with Rw. Pt presenting with decreased activity tolerance with elevated HR during basic activity. During grooming at sink, pt with Max HR 144 and denies any dizziness, nausea, or fatigue; taking seated rest break. HR returning to 110s once supine in bed. Pt would benefit from further acute OT to facilitate safe dc. Recommend dc to home once medically stable per physician.   ? ?Recommendations for follow up therapy are one component of a multi-disciplinary discharge planning process, led by the attending physician.  Recommendations may be updated based on patient status, additional functional criteria and insurance authorization.  ? ?Follow Up Recommendations ? No OT follow up (Pending pt progress)  ?  ?Assistance Recommended at Discharge Frequent or constant Supervision/Assistance  ?Patient can return home with the following A little help with walking and/or transfers;A little help with bathing/dressing/bathroom ? ?  ?Functional Status Assessment ? Patient has had a recent decline in their functional status and demonstrates the ability to make significant improvements in function in a reasonable and predictable amount of time.  ?Equipment Recommendations ? None recommended by OT  ?   ?Recommendations for Other Services   ? ? ?  ?Precautions / Restrictions Precautions ?Precautions: Fall;Other (comment) ?Precaution Comments: watch HR ?Restrictions ?Weight Bearing Restrictions: No  ? ?  ? ?Mobility Bed Mobility ?Overal bed mobility: Modified Independent ?  ?  ?  ?  ?  ?  ?General bed mobility comments: increased time ?  ? ?Transfers ?Overall transfer level: Needs assistance ?Equipment used: Rolling walker (2 wheels) ?Transfers: Sit to/from Stand ?Sit to Stand: Min guard ?  ?  ?  ?  ?  ?General transfer comment: cues for hand placement ?  ? ?  ?Balance Overall balance assessment: Modified Independent ?  ?  ?  ?  ?  ?  ?  ?  ?  ?  ?  ?  ?  ?  ?  ?  ?  ?  ?   ? ?ADL either performed or assessed with clinical judgement  ? ?ADL Overall ADL's : Needs assistance/impaired ?Eating/Feeding: Set up;Sitting ?  ?Grooming: Oral care;Wash/dry face;Min guard;Sitting;Standing ?Grooming Details (indicate cue type and reason): Pt washing his face while standing at sink with Min guard A. HR elevating to 140s and not decreasing. Cueing for seated rest break. Pt completing oral care while seated ?Upper Body Bathing: Min guard;Standing ?Upper Body Bathing Details (indicate cue type and reason): washing axillary areas ?Lower Body Bathing: Min guard;Sit to/from stand ?  ?Upper Body Dressing : Supervision/safety;Set up;Sitting ?  ?Lower Body Dressing: Min guard;Sit to/from stand ?Lower Body Dressing Details (indicate cue type and reason): figure four to change socks ?Toilet Transfer: Min guard;Ambulation;Rolling walker (2 wheels) (simulated to chair) ?  ?  ?  ?  ?  ?Functional mobility during ADLs: Min guard;Rolling walker (2 wheels) ?  General ADL Comments: Pt presenting with decreased activity tolerance with HR elevated with simple activity.  ? ? ? ?Vision   ?   ?   ?Perception   ?  ?Praxis   ?  ? ?Pertinent Vitals/Pain Pain Assessment ?Pain Assessment: No/denies pain  ? ? ? ?Hand Dominance Right ?  ?Extremity/Trunk  Assessment Upper Extremity Assessment ?Upper Extremity Assessment: Generalized weakness ?  ?Lower Extremity Assessment ?Lower Extremity Assessment: Defer to PT evaluation ?  ?Cervical / Trunk Assessment ?Cervical / Trunk Assessment: Normal ?  ?Communication Communication ?Communication: No difficulties ?  ?Cognition Arousal/Alertness: Awake/alert ?Behavior During Therapy: [facility] for tasks assessed/performed ?Overall Cognitive Status: Within Functional Limits for tasks assessed ?  ?  ?  ?  ?  ?  ?  ?  ?  ?  ?  ?  ?  ?  ?  ?  ?  ?  ?  ?General Comments  HR elevating to 144 max with grooming standing at sink. Returning to 117 once in supine at bed. ? ?  ?Exercises   ?  ?Shoulder Instructions    ? ? ?Home Living Family/patient expects to be discharged to:: Private residence ?Living Arrangements: Alone ?Available Help at Discharge: Family;Available PRN/intermittently ?Type of Home: House ?Home Access: Stairs to enter ?Entrance Stairs-Number of Steps: 3 ?Entrance Stairs-Rails: Right ?Home Layout: One level ?  ?  ?Bathroom Shower/Tub: Tub/shower unit ?  ?Bathroom Toilet: Standard ?  ?  ?Home Equipment: Agricultural consultant (2 wheels);Cane - single point;Shower seat ?  ?  ?  ? ?  ?Prior Functioning/Environment Prior Level of Function : Independent/Modified Independent;Driving ?  ?  ?  ?  ?  ?  ?Mobility Comments: used cane prior to admission, reprts he did not use all the time. ?ADLs Comments: independent. works as a Therapist, nutritional ?  ? ?  ?  ?OT Problem List: Decreased strength;Decreased range of motion;Decreased activity tolerance;Impaired balance (sitting and/or standing) ?  ?   ?OT Treatment/Interventions: Self-care/ADL training;Therapeutic exercise;Energy conservation;DME and/or AE instruction  ?  ?OT Goals(Current goals can be found in the care plan section) Acute Rehab OT Goals ?Patient Stated Goal: Go home ?OT Goal Formulation: With patient ?Time For Goal Achievement: 04/02/22 ?Potential to Achieve Goals: Good  ?OT  Frequency: Min 2X/week ?  ? ?Co-evaluation   ?  ?  ?  ?  ? ?  ?AM-PAC OT "6 Clicks" Daily Activity     ?Outcome Measure Help from another person eating meals?: None ?Help from another person taking care of personal grooming?: A Little ?Help from another person toileting, which includes using toliet, bedpan, or urinal?: A Little ?Help from another person bathing (including washing, rinsing, drying)?: A Little ?Help from another person to put on and taking off regular upper body clothing?: A Little ?Help from another person to put on and taking off regular lower body clothing?: A Little ?6 Click Score: 19 ?  ?End of Session Equipment Utilized During Treatment: Rolling walker (2 wheels) ?Nurse Communication: Mobility status ? ?Activity Tolerance: Patient tolerated treatment well ?Patient left: in bed;with call bell/phone within reach ? ?OT Visit Diagnosis: Other abnormalities of gait and mobility (R26.89);Unsteadiness on feet (R26.81);Muscle weakness (generalized) (M62.81)  ?              ?Time: 4034-7425 ?OT Time Calculation (min): 23 min ?Charges:  OT General Charges ?$OT Visit: 1 Visit ?OT Evaluation ?$OT Eval Moderate Complexity: 1 Mod ?OT Treatments ?$Self Care/Home Management : 8-22 mins ? ?Aleena Kirkeby MSOT,  OTR/L ?Acute Rehab ?Pager: [phone] ?Office: [phone] ? ?Morning Halberg M Tehani Mersman ?03/19/2022, 4:14 PM ?

## 2022-03-19 NOTE — Evaluation (Signed)
Physical Therapy Evaluation ?Patient Details ?Name: Darrell Hoffman. ?MRN: [id number] ?DOB: 05/01/1951 ?Today's Date: 03/19/2022 ? ?History of Present Illness ? Darrell Hoffman. is a 71 y.o. male with medical history significant of idiopathic polyneuropathy, chronic pain syndrome, hypertension, hyperlipidemia, GERD, chronic marijuana use, history of hiatal hernia repair surgery in January 2022.  Admitted in January 2023 for intractable nausea and vomiting felt to be secondary to cannabinoid hyperemesis syndrome. Patient presents to the ED today complaining of nausea and vomiting.  Found to be in new onset atrial flutter with rate in the 140s. ?  ?Clinical Impression ? Patient received in bed, he is agreeable to PT evaluation. Reports he is weak and unsure what he can do. HR  108 initially and up to 137 with ambulation. He is independent with bed mobility, transfers with min guard and cues. Ambulated 40 feet with RW and min guard/supervision. No lob using RW. He will continue to benefit from skilled PT while here to improve endurance and strength for safe return home.    ?   ? ?Recommendations for follow up therapy are one component of a multi-disciplinary discharge planning process, led by the attending physician.  Recommendations may be updated based on patient status, additional functional criteria and insurance authorization. ? ?Follow Up Recommendations No PT follow up ? ?  ?Assistance Recommended at Discharge PRN  ?Patient can return home with the following ? Help with stairs or ramp for entrance ? ?  ?Equipment Recommendations None recommended by PT  ?Recommendations for Other Services ?    ?  ?Functional Status Assessment Patient has had a recent decline in their functional status and demonstrates the ability to make significant improvements in function in a reasonable and predictable amount of time.  ? ?  ?Precautions / Restrictions Precautions ?Precautions: Fall ?Restrictions ?Weight Bearing Restrictions: No   ? ?  ? ?Mobility ? Bed Mobility ?Overal bed mobility: Independent ?  ?  ?  ?  ?  ?  ?  ?  ? ?Transfers ?Overall transfer level: Needs assistance ?Equipment used: Rolling walker (2 wheels) ?Transfers: Sit to/from Stand ?Sit to Stand: Min guard ?  ?  ?  ?  ?  ?General transfer comment: cues for hand placement ?  ? ?Ambulation/Gait ?Ambulation/Gait assistance: Min guard ?Gait Distance (Feet): 40 Feet ?Assistive device: Rolling walker (2 wheels) ?Gait Pattern/deviations: Step-through pattern ?Gait velocity: decr ?  ?  ?General Gait Details: patient generally steady with ambulation using RW. ? ?Stairs ?  ?  ?  ?  ?  ? ?Wheelchair Mobility ?  ? ?Modified Rankin (Stroke Patients Only) ?  ? ?  ? ?Balance Overall balance assessment: Modified Independent ?  ?  ?  ?  ?  ?  ?  ?  ?  ?  ?  ?  ?  ?  ?  ?  ?  ?  ?   ? ? ? ?Pertinent Vitals/Pain Pain Assessment ?Pain Assessment: No/denies pain  ? ? ?Home Living Family/patient expects to be discharged to:: Private residence ?Living Arrangements: Alone ?Available Help at Discharge: Family;Available PRN/intermittently ?Type of Home: House ?Home Access: Stairs to enter ?Entrance Stairs-Rails: Right ?Entrance Stairs-Number of Steps: 3 ?  ?Home Layout: One level ?Home Equipment: Conservation officer, nature (2 wheels);Cane - single point ?   ?  ?Prior Function Prior Level of Function : Independent/Modified Independent;Driving ?  ?  ?  ?  ?  ?  ?Mobility Comments: used cane prior to admission, reprts he  did not use all the time. ?ADLs Comments: independent ?  ? ? ?Hand Dominance  ?   ? ?  ?Extremity/Trunk Assessment  ? Upper Extremity Assessment ?Upper Extremity Assessment: Generalized weakness ?  ? ?Lower Extremity Assessment ?Lower Extremity Assessment: Generalized weakness ?  ? ?Cervical / Trunk Assessment ?Cervical / Trunk Assessment: Normal  ?Communication  ? Communication: No difficulties  ?Cognition Arousal/Alertness: Awake/alert ?Behavior During Therapy: [facility] for tasks  assessed/performed ?Overall Cognitive Status: Within Functional Limits for tasks assessed ?  ?  ?  ?  ?  ?  ?  ?  ?  ?  ?  ?  ?  ?  ?  ?  ?  ?  ?  ? ?  ?General Comments   ? ?  ?Exercises    ? ?Assessment/Plan  ?  ?PT Assessment Patient needs continued PT services  ?PT Problem List Decreased strength;Decreased mobility;Decreased activity tolerance;Decreased balance ? ?   ?  ?PT Treatment Interventions DME instruction;Therapeutic exercise;Gait training;Stair training;Functional mobility training;Therapeutic activities;Patient/family education   ? ?PT Goals (Current goals can be found in the Care Plan section)  ?Acute Rehab PT Goals ?Patient Stated Goal: to go home ?PT Goal Formulation: With patient ?Time For Goal Achievement: 04/02/22 ?Potential to Achieve Goals: Good ? ?  ?Frequency Min 3X/week ?  ? ? ?Co-evaluation   ?  ?  ?  ?  ? ? ?  ?AM-PAC PT "6 Clicks" Mobility  ?Outcome Measure Help needed turning from your back to your side while in a flat bed without using bedrails?: None ?Help needed moving from lying on your back to sitting on the side of a flat bed without using bedrails?: None ?Help needed moving to and from a bed to a chair (including a wheelchair)?: A Little ?Help needed standing up from a chair using your arms (e.g., wheelchair or bedside chair)?: A Little ?Help needed to walk in hospital room?: A Little ?Help needed climbing 3-5 steps with a railing? : A Little ?6 Click Score: 20 ? ?  ?End of Session   ?Activity Tolerance: Patient tolerated treatment well ?Patient left: in bed;with call bell/phone within reach;with bed alarm set ?Nurse Communication: Mobility status ?PT Visit Diagnosis: Other abnormalities of gait and mobility (R26.89);Muscle weakness (generalized) (M62.81) ?  ? ?Time: 0488-8916 ?PT Time Calculation (min) (ACUTE ONLY): 13 min ? ? ?Charges:   PT Evaluation ?$PT Eval Moderate Complexity: 1 Mod ?  ?  ?   ? ? ?[employer], PT, GCS ?03/19/22,10:48 AM ? ?

## 2022-03-19 NOTE — Progress Notes (Signed)
? ?Progress Note ? ?Patient Name: Darrell Hoffman. ?Date of Encounter: 03/19/2022 ? ?Primary Cardiologist: Gasper Sells  ? ?Subjective  ? ?Still working to wean to PO diltiazem; heart rates as high as 130s, not down to 10ss.  No palpitations (asymptomatic from AFL). ? ?Notes concerns about going home given his visual changes, and numbness.  Limited mobility.  He notes that he lives by himself. ? ?Inpatient Medications  ?  ?Scheduled Meds: ? apixaban  5 mg Oral BID  ? diltiazem  120 mg Oral Q6H  ? pantoprazole  40 mg Oral Daily  ? ?Continuous Infusions: ? ?PRN Meds: ?acetaminophen **OR** acetaminophen, ondansetron (ZOFRAN) IV  ? ?Vital Signs  ?  ?Vitals:  ? 03/18/22 2356 03/19/22 0330 03/19/22 0354 03/19/22 0732  ?BP: 129/84 (!) 126/94  (!) 134/92  ?Pulse:  (!) 112  (!) 133  ?Resp: '14 18  20  '$ ?Temp: 98.2 ?F (36.8 ?C) 97.9 ?F (36.6 ?C)  97.8 ?F (36.6 ?C)  ?TempSrc: Oral Oral  Oral  ?SpO2: 100% 99%  97%  ?Weight:   65.1 kg   ?Height:      ? ? ?Intake/Output Summary (Last 24 hours) at 03/19/2022 0911 ?Last data filed at 03/19/2022 0800 ?Gross per 24 hour  ?Intake 580.83 ml  ?Output 1000 ml  ?Net -419.17 ml  ? ?Filed Weights  ? 03/17/22 2005 03/19/22 0354  ?Weight: 72.8 kg 65.1 kg  ? ? ?Telemetry  ?  ?AFL RVR raates 130s-> 100s - Personally Reviewed ? ?Physical Exam  ? ?Gen: no distress  ?Neck: No JVD,  carotid bruit ?Ears:  Pilar Plate Sign ?Cardiac: No Rubs or Gallops, no Murmur, Regular tachycardia, +2 radial pulses ?Respiratory: Clear to auscultation bilaterally, normal effort, normal  respiratory rate ?GI: Soft, nontender, non-distended  ?MS: No  edema ?Integument: Skin feels warm ?Neuro:  At time of evaluation, alert and oriented to person/place/time/situation  ?Psych: Normal affect, patient feels better ? ? ?Labs  ?  ?Chemistry ?Recent Labs  ?Lab 03/12/22 ?1158 03/17/22 ?1809 03/19/22 ?0429  ?NA 138 135 139  ?K 5.3* 3.9 3.8  ?CL 101 99 106  ?CO2 26 22 20*  ?GLUCOSE 133* 94 83  ?BUN '17 22 15  '$ ?CREATININE 1.21 1.20 1.02   ?CALCIUM 10.0 9.4 8.5*  ?PROT 7.5 7.2  --   ?ALBUMIN 4.4 4.1  --   ?AST 36 26  --   ?ALT 61* 29  --   ?ALKPHOS 128* 80  --   ?BILITOT 0.9 1.4*  --   ?GFRNONAA  --  >60 >60  ?ANIONGAP  --  14 13  ?  ? ?Hematology ?Recent Labs  ?Lab 03/12/22 ?1158 03/17/22 ?1809 03/19/22 ?0429  ?WBC 11.0* 11.9* 9.1  ?RBC 3.95* 4.86 4.51  ?HGB 10.7* 13.3 12.4*  ?HCT 33.5* 40.5 37.4*  ?MCV 84.7 83.3 82.9  ?MCH  --  27.4 27.5  ?MCHC 31.9 32.8 33.2  ?RDW 17.1* 16.4* 16.1*  ?PLT 234.0 204 205  ? ? ?Cardiac EnzymesNo results for input(s): TROPONINI in the last 168 hours. No results for input(s): TROPIPOC in the last 168 hours.  ? ?BNPNo results for input(s): BNP, PROBNP in the last 168 hours.  ? ?DDimer No results for input(s): DDIMER in the last 168 hours.  ? ?Radiology  ?  ?ECHOCARDIOGRAM COMPLETE ? ?Result Date: 03/18/2022 ?   ECHOCARDIOGRAM REPORT   Patient Name:   Darrell Hoffman. Date of Exam: 03/18/2022 Medical Rec #:  [id number]        Height:  66.0 in Accession #:    [id number]       Weight:       160.5 lb Date of Birth:  1951/08/22         BSA:          1.821 m? Patient Age:    71 years         BP:           128/86 mmHg Patient Gender: M                HR:           102 bpm. Exam Location:  Inpatient Procedure: 2D Echo, Color Doppler and Cardiac Doppler Indications:    I48.92* Unspecified atrial flutter  History:        Patient has prior history of Echocardiogram examinations, most                 recent 01/03/2022. Risk Factors:Hypertension, Dyslipidemia and                 ETOH.  Sonographer:    Bernadene Person RDCS Referring Phys: [id number] [place]  1. Pt in atrial flutter during the study.  2. Left ventricular ejection fraction, by estimation, is 50 to 55%. The left ventricle has low normal function. The left ventricle has no regional wall motion abnormalities. Left ventricular diastolic parameters are consistent with Grade I diastolic dysfunction (impaired relaxation).  3. Right ventricular systolic function  is normal. The right ventricular size is normal. There is normal pulmonary artery systolic pressure.  4. Left atrial size was moderately dilated.  5. The mitral valve is normal in structure. Mild mitral valve regurgitation. No evidence of mitral stenosis.  6. Tricuspid valve regurgitation is moderate.  7. The aortic valve is tricuspid. Aortic valve regurgitation is not visualized. No aortic stenosis is present.  8. The inferior vena cava is normal in size with greater than 50% respiratory variability, suggesting right atrial pressure of 3 mmHg. Comparison(s): Changes from prior study are noted. FINDINGS  Left Ventricle: Left ventricular ejection fraction, by estimation, is 50 to 55%. The left ventricle has low normal function. The left ventricle has no regional wall motion abnormalities. The left ventricular internal cavity size was normal in size. There is no left ventricular hypertrophy. Left ventricular diastolic parameters are consistent with Grade I diastolic dysfunction (impaired relaxation). Right Ventricle: The right ventricular size is normal. No increase in right ventricular wall thickness. Right ventricular systolic function is normal. There is normal pulmonary artery systolic pressure. The tricuspid regurgitant velocity is 2.73 m/s, and  with an assumed right atrial pressure of 3 mmHg, the estimated right ventricular systolic pressure is 47.8 mmHg. Left Atrium: Left atrial size was moderately dilated. Right Atrium: Right atrial size was normal in size. Pericardium: There is no evidence of pericardial effusion. Mitral Valve: The mitral valve is normal in structure. Mild mitral valve regurgitation. No evidence of mitral valve stenosis. Tricuspid Valve: The tricuspid valve is normal in structure. Tricuspid valve regurgitation is moderate . No evidence of tricuspid stenosis. Aortic Valve: The aortic valve is tricuspid. Aortic valve regurgitation is not visualized. No aortic stenosis is present. Pulmonic  Valve: The pulmonic valve was not well visualized. Pulmonic valve regurgitation is not visualized. No evidence of pulmonic stenosis. Aorta: The aortic root is normal in size and structure. Venous: The inferior vena cava is normal in size with greater than 50% respiratory variability, suggesting right atrial pressure of 3 mmHg. IAS/Shunts:  No atrial level shunt detected by color flow Doppler. Additional Comments: Pt in atrial flutter during the study.  LEFT VENTRICLE PLAX 2D LVIDd:         4.40 cm LVIDs:         3.40 cm LV PW:         1.10 cm LV IVS:        1.00 cm LVOT diam:     2.00 cm LV SV:         43 LV SV Index:   23 LVOT Area:     3.14 cm?  LV Volumes (MOD) LV vol d, MOD A2C: 71.9 ml LV vol d, MOD A4C: 68.6 ml LV vol s, MOD A2C: 38.2 ml LV vol s, MOD A4C: 36.8 ml LV SV MOD A2C:     33.7 ml LV SV MOD A4C:     68.6 ml LV SV MOD BP:      33.9 ml RIGHT VENTRICLE TAPSE (M-mode): 1.2 cm LEFT ATRIUM             Index        RIGHT ATRIUM           Index LA diam:        5.00 cm 2.75 cm/m?   RA Area:     19.90 cm? LA Vol (A2C):   75.3 ml 41.35 ml/m?  RA Volume:   54.10 ml  29.70 ml/m? LA Vol (A4C):   66.8 ml 36.68 ml/m? LA Biplane Vol: 71.9 ml 39.48 ml/m?  AORTIC VALVE LVOT Vmax:   91.27 cm/s LVOT Vmean:  59.567 cm/s LVOT VTI:    0.136 m  AORTA Ao Root diam: 3.00 cm Ao Asc diam:  3.50 cm MR PISA:        0.57 cm?  TRICUSPID VALVE MR PISA Radius: 0.30 cm   TR Peak grad:   29.8 mmHg                           TR Vmax:        273.00 cm/s                            SHUNTS                           Systemic VTI:  0.14 m                           Systemic Diam: 2.00 cm Kirk Ruths MD Electronically signed by Kirk Ruths MD Signature Date/Time: 03/18/2022/10:50:49 AM    Final   ? ?US Abdomen Limited RUQ (LIVER/GB) ? ?Result Date: 03/17/2022 ?CLINICAL DATA:  Intractable nausea, vomiting EXAM: ULTRASOUND ABDOMEN LIMITED RIGHT UPPER QUADRANT COMPARISON:  CT 03/13/2022 FINDINGS: Gallbladder: No visible stones or wall thickening.  Negative sonographic Murphy sign. Common bile duct: Diameter: Normal caliber, 5 mm Liver: No focal lesion identified. Within normal limits in parenchymal echogenicity. Portal vein is patent on color Doppler ima

## 2022-03-19 NOTE — Plan of Care (Signed)

## 2022-03-20 ENCOUNTER — Other Ambulatory Visit (HOSPITAL_COMMUNITY): Payer: Self-pay

## 2022-03-20 DIAGNOSIS — I4892 Unspecified atrial flutter: Secondary | ICD-10-CM | POA: Diagnosis not present

## 2022-03-20 DIAGNOSIS — I10 Essential (primary) hypertension: Secondary | ICD-10-CM | POA: Diagnosis not present

## 2022-03-20 DIAGNOSIS — R112 Nausea with vomiting, unspecified: Secondary | ICD-10-CM

## 2022-03-20 DIAGNOSIS — R778 Other specified abnormalities of plasma proteins: Secondary | ICD-10-CM | POA: Diagnosis not present

## 2022-03-20 DIAGNOSIS — R5381 Other malaise: Secondary | ICD-10-CM

## 2022-03-20 MED ORDER — POLYETHYLENE GLYCOL 3350 17 GM/SCOOP PO POWD
17.0000 g | Freq: Every day | ORAL | 0 refills | Status: AC
  Filled 2022-03-20: qty 238, 14d supply, fill #0

## 2022-03-20 MED ORDER — POLYETHYLENE GLYCOL 3350 17 G PO PACK
17.0000 g | PACK | Freq: Two times a day (BID) | ORAL | Status: DC
Start: 2022-03-20 — End: 2022-03-20
  Administered 2022-03-20: 17 g via ORAL
  Filled 2022-03-20: qty 1

## 2022-03-20 MED ORDER — APIXABAN 5 MG PO TABS
5.0000 mg | ORAL_TABLET | Freq: Two times a day (BID) | ORAL | 0 refills | Status: AC
Start: 2022-03-20 — End: ?
  Filled 2022-03-20: qty 60, 30d supply, fill #0

## 2022-03-20 MED ORDER — SENNOSIDES-DOCUSATE SODIUM 8.6-50 MG PO TABS
1.0000 | ORAL_TABLET | Freq: Every day | ORAL | 0 refills | Status: AC
  Filled 2022-03-20: qty 30, 30d supply, fill #0

## 2022-03-20 MED ORDER — DILTIAZEM HCL ER BEADS 360 MG PO CP24
360.0000 mg | ORAL_CAPSULE | Freq: Every day | ORAL | 0 refills | Status: AC
Start: 2022-03-20 — End: ?
  Filled 2022-03-20: qty 30, 30d supply, fill #0

## 2022-03-20 MED ORDER — PANTOPRAZOLE SODIUM 40 MG PO TBEC
40.0000 mg | DELAYED_RELEASE_TABLET | Freq: Every day | ORAL | 0 refills | Status: AC
  Filled 2022-03-20: qty 30, 30d supply, fill #0

## 2022-03-20 MED ORDER — BISACODYL 10 MG RE SUPP: 10.0000 mg | Freq: Every day | RECTAL | Status: DC | PRN

## 2022-03-20 MED ORDER — ACETAMINOPHEN 325 MG PO TABS
650.0000 mg | ORAL_TABLET | Freq: Four times a day (QID) | ORAL | 0 refills | Status: AC | PRN
  Filled 2022-03-20: qty 20, 3d supply, fill #0

## 2022-03-20 MED ORDER — DILTIAZEM HCL ER COATED BEADS 180 MG PO CP24
360.0000 mg | ORAL_CAPSULE | Freq: Every day | ORAL | Status: DC
  Filled 2022-03-20: qty 2

## 2022-03-20 MED ORDER — SENNOSIDES-DOCUSATE SODIUM 8.6-50 MG PO TABS
1.0000 | ORAL_TABLET | Freq: Two times a day (BID) | ORAL | Status: DC
  Administered 2022-03-20: 1 via ORAL
  Filled 2022-03-20: qty 1

## 2022-03-20 NOTE — TOC Transition Note (Signed)
Transition of Care (TOC) - CM/SW Discharge Note ? ? ?Patient Details  ?Name: Darrell Hoffman. ?MRN: [id number] ?Date of Birth: [birth year] ? ?Transition of Care (TOC) CM/SW Contact:  ?Zenon Mayo, RN ?Phone Number: ?03/20/2022, 10:49 AM ? ? ?Clinical Narrative:    ?Patient is indep, will have transport home at dc. He has no needs. ? ? ?  ?  ? ? ?Patient Goals and CMS Choice ?  ?  ?  ? ?Discharge Placement ?  ?           ?  ?  ?  ?  ? ?Discharge Plan and Services ?  ?  ?           ?  ?  ?  ?  ?  ?  ?  ?  ?  ?  ? ?Social Determinants of Health (SDOH) Interventions ?  ? ? ?Readmission Risk Interventions ? ?  01/03/2022  ? 12:35 PM  ?Readmission Risk Prevention Plan  ?Transportation Screening Complete  ?PCP or Specialist Appt within 3-5 Days Complete  ?[place] or Home Care Consult Complete  ?Social Work Consult for [place] Planning/Counseling Complete  ?Palliative Care Screening Complete  ?Medication Review Press photographer) Complete  ? ? ? ? ? ?

## 2022-03-20 NOTE — Progress Notes (Signed)
? ?Progress Note ? ?Patient Name: Darrell Hoffman. ?Date of Encounter: 03/20/2022 ? ?Primary Cardiologist: Gasper Sells  ? ?Subjective  ? ?Walked around to room with therapy. ?No CP, SOB, Palpitations ?HR control has improved. ? ?Inpatient Medications  ?  ?Scheduled Meds: ? apixaban  5 mg Oral BID  ? diltiazem  360 mg Oral Daily  ? pantoprazole  40 mg Oral Daily  ? polyethylene glycol  17 g Oral BID  ? senna-docusate  1 tablet Oral BID  ? ?Continuous Infusions: ? ?PRN Meds: ?acetaminophen **OR** acetaminophen, bisacodyl, ondansetron (ZOFRAN) IV  ? ?Vital Signs  ?  ?Vitals:  ? 03/19/22 1800 03/19/22 1930 03/20/22 0430 03/20/22 0741  ?BP: 134/81 110/79 99/73 135/78  ?Pulse: (!) 110 67 92 (!) 53  ?Resp:  '18 18 20  '$ ?Temp:  98.6 ?F (37 ?C) 98.5 ?F (36.9 ?C) 98.3 ?F (36.8 ?C)  ?TempSrc:  Oral Oral Oral  ?SpO2: 100% 100% 98% 97%  ?Weight:   65.5 kg   ?Height:      ? ? ?Intake/Output Summary (Last 24 hours) at 03/20/2022 1034 ?Last data filed at 03/20/2022 0816 ?Gross per 24 hour  ?Intake 240 ml  ?Output 700 ml  ?Net -460 ml  ? ?Filed Weights  ? 03/17/22 2005 03/19/22 0354 03/20/22 0430  ?Weight: 72.8 kg 65.1 kg 65.5 kg  ? ? ?Telemetry  ?  ?AFL Rates 80s - Personally Reviewed ? ?Physical Exam  ? ?Gen: no distress  ?Neck: No JVD ?Cardiac: No Rubs or Gallops, no Murmur, Regular rhythm, +2 radial pulses ?Respiratory: Clear to auscultation bilaterally, normal effort, normal  respiratory rate ?GI: Soft, nontender, non-distended  ?MS: No edema ?Integument: Skin feels warm ?Neuro:  At time of evaluation, alert and oriented to person/place/time/situation  ?Psych: Normal affect, patient feels better ? ? ?Labs  ?  ?Chemistry ?Recent Labs  ?Lab 03/17/22 ?1809 03/19/22 ?0429  ?NA 135 139  ?K 3.9 3.8  ?CL 99 106  ?CO2 22 20*  ?GLUCOSE 94 83  ?BUN 22 15  ?CREATININE 1.20 1.02  ?CALCIUM 9.4 8.5*  ?PROT 7.2  --   ?ALBUMIN 4.1  --   ?AST 26  --   ?ALT 29  --   ?ALKPHOS 80  --   ?BILITOT 1.4*  --   ?GFRNONAA >60 >60  ?ANIONGAP 14 13  ?   ? ?Hematology ?Recent Labs  ?Lab 03/17/22 ?1809 03/19/22 ?0429  ?WBC 11.9* 9.1  ?RBC 4.86 4.51  ?HGB 13.3 12.4*  ?HCT 40.5 37.4*  ?MCV 83.3 82.9  ?MCH 27.4 27.5  ?MCHC 32.8 33.2  ?RDW 16.4* 16.1*  ?PLT 204 205  ? ? ?Cardiac EnzymesNo results for input(s): TROPONINI in the last 168 hours. No results for input(s): TROPIPOC in the last 168 hours.  ? ?BNPNo results for input(s): BNP, PROBNP in the last 168 hours.  ? ?DDimer No results for input(s): DDIMER in the last 168 hours.  ? ?Radiology  ?  ?No results found. ? ? ?Patient Profile  ?   ?71 y.o. male HTN, HLD, CKD Stabe IIIA, Chronic Marijuana use with nausea and vomiting with new AFL ? ?Assessment & Plan  ?  ?AFL ?CHADSVASC 2 on Eliquis ?HTN ?- diltiazem 360 mg XL (transitioned today), Eliquis 5, with A fib clinic f/u for potentially DCCV and future statin start given ASCVD risk 9% ? ?[facility] will sign off.   ?Medication Recommendations:  CCB and DOAC as above ?Other recommendations (labs, testing, etc):  none ?Follow up as an outpatient:  arranging f/u with AF clinic then me ? ? ? ?For questions or updates, please contact [place] ?Please consult [email or web address] for contact info under Cardiology/STEMI. ?  ?   ?Signed, ?Werner Lean, MD  ?03/20/2022, 10:34 AM   ? ?

## 2022-03-20 NOTE — Discharge Summary (Signed)
Physician Discharge Summary  ?Darrell Hoffman. JSE:[id number] DOB: [birth year] DOA: 03/17/2022 ? ?PCP: Biagio Borg, MD ? ?Admit date: 03/17/2022 ?Discharge date: 03/20/2022 ? ?Admitted From: Home ?Disposition:  Home ? ?Recommendations for Outpatient Follow-up:  ?Follow up with PCP in 1-2 weeks ?Follow up with Cardiology in the outpatient setting  ?Please obtain CMP/CBC, Mag, Phos in one week ?Please follow up on the following pending results: ? ?Home Health: No  ?Equipment/Devices: None   ? ?Discharge Condition: Stable  ?CODE STATUS: FULL   ?Diet recommendation: Heart Healthy  ? ?Brief/Interim Summary: ?Darrell Lunden. is a 71 year old male with past medical history significant for idiopathic polyneuropathy, essential hypertension, hyperlipidemia, GERD, chronic pain syndrome, chronic marijuana abuse, history of hiatal hernia s/p repair January 2022 who presented to [facility] ED on 4/2 complaining of nausea and vomiting.  Patient with multiple admissions for intractable nausea and vomiting felt to be secondary to cannabinoid hyperemesis syndrome.  He was seen by gastroenterology on 02/19/2022 and was started on Protonix 40 mg p.o. daily as he had evidence of distal esophagitis on prior CT.  He was seen again by his PCP on 03/12/2022 for nausea and vomiting and exam concerning for supraumbilical hernia and was referred to general surgery.  CT done the following day showed gallbladder wall thickening and pericholecystic fluid without gallstones or biliary ductal dilation. ?  ?In the ED, temperature 98.5 ?F, HR 146, RR 14, BP 149/110, SPO2 100% on room air.  Sodium 135, potassium 3.9, chloride 99, CO2 22, glucose 94, BUN 22, creatinine 1.20.  Lipase 24, AST 26, ALT 29, total bilirubin 1.4.  WBC 11.9, hemoglobin 13.3, platelets 204.  High sensitive troponin 48> 45.  Urinalysis unrevealing.  UDS positive for THC.  EKG remarkable for atrial flutter with RVR with 2-1 AV block, right bundle blanch block, rate 146.  EDP administered  adenosine, metoprolol, Cardizem bolus followed by continuous Cardizem infusion.  Patient was also given 1 dose of Eliquis and 1 L IV fluid bolus.  TRH consulted for further evaluation and management of atrial flutter with RVR which is new onset and intractable nausea/vomiting likely secondary to chronic marijuana abuse. ?  ?**Interim History ?Cardiology was consulted for its a flutter with rapid ventricular response and they are able to control his heart rates and his Cardizem was changed to 60 mg p.o. daily and they recommended anticoagulation with Eliquis.  Patient did not convert to normal sinus rhythm but he was in a flutter with rates in the 80s that were controlled.  They recommended following up in the A-fib clinic for potential DCCV in future statin to start given his ASCVD risk of 9%.  Cardiology recommended calcium channel blocker and DOAC and close follow-up given that he improved.  His nausea vomiting also improved and he is stable for discharge at this time. ? ?Discharge Diagnoses:  ?Principal Problem: ?  Atrial flutter with rapid ventricular response ([place]) ?Active Problems: ?  Intractable nausea and vomiting ?  Elevated troponin ?  Essential hypertension ?  Tetrahydrocannabinol (THC) use disorder, moderate, dependence ([place]) ?  Physical deconditioning ? ?A Flutter with RVR ?-Upon ED presentation, patient was noted to be in a flutter with RVR with rates in the 140s.  Patient was given adenosine, metoprolol, IV Cardizem bolus and started on a continuous infusion.  CHA2DS2-VASc score = 2.  New onset.  UDS positive for THC.  TSH 1.752.  TTE with low normal LVEF 50-55%, no regional wall motion abnormalities, grade 1 diastolic  dysfunction, mild MR, moderate TR, no aortic stenosis, IVC normal in size. ?--Cardiology following, appreciate assistance ?--Cardizem increased to '90mg'$  PO q6h and then changed to 60 mg p.o. daily and he tolerated this well and was rate controlled so they recommended discharging to 60 mg  XL daily ?--Eliquis 5 mg p.o. twice daily ?--Continue to monitor on telemetry while hospitalized ?  ?Intractable nausea and vomiting ?-This is an ongoing problem in the setting of chronic marijuana use.   ?-CT done in January 2023 showing evidence of distal esophagitis and was started on PPI by gastroenterology.   ?-Recently seen by PCP for the same problem and CT done 3/28 showing gallbladder wall thickening and pericholecystic fluid; no gallstones or biliary ductal dilatation.  Findings concerning for acute cholecystitis, although nonspecific in the setting of small volume ascites throughout the abdomen and pelvis.   ?-Labs done today showing borderline stable leukocytosis and no significant elevation of liver enzymes.   ?-Lipase normal.  He is not febrile.  Not endorsing abdominal pain and exam benign.  Urinalysis unrevealing.   ?-Right upper quadrant ultrasound with no gallbladder wall thickening, no pericholecystic fluid, no visualized stones, no sonographic Murphy sign. ?--Tolerating diet ?--Antiemetic as needed ?--IV Protonix 40 mg daily at discharge ?--Counseled on THC cessation ?-Follow-up with PCP in outpatient setting ?  ?Elevated troponin ?-Likely due to demand ischemia from new onset atrial flutter.  Troponin mildly elevated but improved compared to prior labs. ?--Continue to monitor on telemetry ?  ?Essential hypertension ?-Not on any antihypertensives at home.  Blood pressure currently stable. ?-Cardizem was changed to 360 mg XL daily with DOAC ?  ?Tetrahydrocannabinol (THC) use disorder, moderate, dependence ([facility]) ?-UDS positive for THC.  Longstanding history of THC abuse likely causing cannabinoid hyperemesis syndrome.  Counseled on need for cessation. ?  ?Physical deconditioning ?--PT/OT evaluation recommending no follow-up and he stable for discharge ?  ?Normocytic anemia ?-Patient's hemoglobin/hematocrit at the time of discharge was 12.4/37.4 ?-Continue monitor and trend and repeat CBC in  outpatient setting and obtain an anemia panel in outpatient setting ? ?Metabolic Acidosis ?-Patient's CO2 is now 20, anion gap is 13, chloride level is 106 ?-Continue to monitor and trend and repeat CMP within 1 week ? ?Discharge Instructions ? ?Discharge Instructions   ? ? Amb referral to AFIB Clinic   Complete by: As directed ?  ? Call MD for:  difficulty breathing, headache or visual disturbances   Complete by: As directed ?  ? Call MD for:  extreme fatigue   Complete by: As directed ?  ? Call MD for:  hives   Complete by: As directed ?  ? Call MD for:  persistant dizziness or light-headedness   Complete by: As directed ?  ? Call MD for:  persistant nausea and vomiting   Complete by: As directed ?  ? Call MD for:  redness, tenderness, or signs of infection (pain, swelling, redness, odor or green/yellow discharge around incision site)   Complete by: As directed ?  ? Call MD for:  severe uncontrolled pain   Complete by: As directed ?  ? Call MD for:  temperature >100.4   Complete by: As directed ?  ? Diet - low sodium heart healthy   Complete by: As directed ?  ? Discharge instructions   Complete by: As directed ?  ? You were cared for by a hospitalist during your hospital stay. If you have any questions about your discharge medications or the care you received while  you were in the hospital after you are discharged, you can call the unit and ask to speak with the hospitalist on call if the hospitalist that took care of you is not available. Once you are discharged, your primary care physician will handle any further medical issues. Please note that NO REFILLS for any discharge medications will be authorized once you are discharged, as it is imperative that you return to your primary care physician (or establish a relationship with a primary care physician if you do not have one) for your aftercare needs so that they can reassess your need for medications and monitor your lab values. ? ?Follow up with PCP and  Cardiology within 1-2 weeks. Take all medications as prescribed. If symptoms change or worsen please return to the ED for evaluation  ? Increase activity slowly   Complete by: As directed ?  ? ?  ? ?Allergies as of 4/5/

## 2022-03-26 ENCOUNTER — Other Ambulatory Visit (HOSPITAL_COMMUNITY): Payer: Self-pay

## 2022-03-26 ENCOUNTER — Telehealth (HOSPITAL_COMMUNITY): Payer: Self-pay

## 2022-03-26 NOTE — Telephone Encounter (Signed)
TOC OUTPATIENT PHARMACY FOLLOW-UP ?Call #1 attempted for [facility] pharmacy follow-up.  Left VM Will follow up in 2-3 days.  ? ?

## 2022-03-28 ENCOUNTER — Encounter: Payer: Self-pay | Admitting: Internal Medicine

## 2022-03-28 ENCOUNTER — Other Ambulatory Visit (HOSPITAL_COMMUNITY): Payer: Self-pay

## 2022-03-28 ENCOUNTER — Ambulatory Visit: Payer: Federal, State, Local not specified - PPO | Admitting: Internal Medicine

## 2022-03-28 ENCOUNTER — Telehealth (HOSPITAL_COMMUNITY): Payer: Self-pay

## 2022-03-28 VITALS — BP 122/80 | HR 80 | Temp 98.8°F | Ht 66.0 in | Wt 162.0 lb

## 2022-03-28 DIAGNOSIS — R739 Hyperglycemia, unspecified: Secondary | ICD-10-CM

## 2022-03-28 DIAGNOSIS — N1831 Chronic kidney disease, stage 3a: Secondary | ICD-10-CM

## 2022-03-28 DIAGNOSIS — I4892 Unspecified atrial flutter: Secondary | ICD-10-CM | POA: Diagnosis not present

## 2022-03-28 DIAGNOSIS — R112 Nausea with vomiting, unspecified: Secondary | ICD-10-CM | POA: Diagnosis not present

## 2022-03-28 DIAGNOSIS — I10 Essential (primary) hypertension: Secondary | ICD-10-CM | POA: Diagnosis not present

## 2022-03-28 LAB — CBC WITH DIFFERENTIAL/PLATELET
Basophils Absolute: 0 10*3/uL (ref 0.0–0.1)
Basophils Relative: 0.3 % (ref 0.0–3.0)
Eosinophils Absolute: 0.1 10*3/uL (ref 0.0–0.7)
Eosinophils Relative: 1 % (ref 0.0–5.0)
HCT: 32.1 % — ABNORMAL LOW (ref 39.0–52.0)
Hemoglobin: 10.3 g/dL — ABNORMAL LOW (ref 13.0–17.0)
Lymphocytes Relative: 26.6 % (ref 12.0–46.0)
Lymphs Abs: 2.2 10*3/uL (ref 0.7–4.0)
MCHC: 32.1 g/dL (ref 30.0–36.0)
MCV: 84 fl (ref 78.0–100.0)
Monocytes Absolute: 0.5 10*3/uL (ref 0.1–1.0)
Monocytes Relative: 5.9 % (ref 3.0–12.0)
Neutro Abs: 5.4 10*3/uL (ref 1.4–7.7)
Neutrophils Relative %: 66.2 % (ref 43.0–77.0)
Platelets: 338 10*3/uL (ref 150.0–400.0)
RBC: 3.82 Mil/uL — ABNORMAL LOW (ref 4.22–5.81)
RDW: 16.8 % — ABNORMAL HIGH (ref 11.5–15.5)
WBC: 8.2 10*3/uL (ref 4.0–10.5)

## 2022-03-28 LAB — HEPATIC FUNCTION PANEL
ALT: 16 U/L (ref 0–53)
AST: 18 U/L (ref 0–37)
Albumin: 3.9 g/dL (ref 3.5–5.2)
Alkaline Phosphatase: 56 U/L (ref 39–117)
Bilirubin, Direct: 0.1 mg/dL (ref 0.0–0.3)
Total Bilirubin: 0.5 mg/dL (ref 0.2–1.2)
Total Protein: 6.7 g/dL (ref 6.0–8.3)

## 2022-03-28 LAB — BASIC METABOLIC PANEL
BUN: 11 mg/dL (ref 6–23)
CO2: 27 mEq/L (ref 19–32)
Calcium: 9.2 mg/dL (ref 8.4–10.5)
Chloride: 104 mEq/L (ref 96–112)
Creatinine, Ser: 1.02 mg/dL (ref 0.40–1.50)
GFR: 74.9 mL/min (ref >60.00)
Glucose, Bld: 87 mg/dL (ref 70–99)
Potassium: 4.9 mEq/L (ref 3.5–5.1)
Sodium: 138 mEq/L (ref 135–145)

## 2022-03-28 LAB — MAGNESIUM: Magnesium: 1.9 mg/dL (ref 1.5–2.5)

## 2022-03-28 LAB — PHOSPHORUS: Phosphorus: 3.9 mg/dL (ref 2.3–4.6)

## 2022-03-28 NOTE — Telephone Encounter (Signed)
Pharmacy Transitions of Care Follow-up Telephone Call ? ?Date of discharge: 03/20/22  ?Discharge Diagnosis: Afib ? ?How have you been since you were released from the hospital? Patient doing well since discharge, no questions about meds at this time.   ? ?Medication changes made at discharge: ?START taking: ?acetaminophen (TYLENOL)  ?diltiazem [facility])  ?Eliquis (apixaban)  ?pantoprazole (PROTONIX)  ?polyethylene glycol powder (GLYCOLAX/MIRALAX)  ?Senexon-S (senna-docusate)  ? ?Medication changes verified by the patient? Yes ?  ? ?Medication Accessibility: ? ?Home Pharmacy: Not discussed  ? ?Was the patient provided with refills on discharged medications? No  ? ?Have all prescriptions been transferred from [facility] to home pharmacy? N/A  ? ?Is the patient able to afford medications? Has insurance ?  ? ?Medication Review: ? ?APIXABAN (ELIQUIS)  ?Apixaban 5 mg BID initiated on 03/20/22 ?- Discussed importance of taking medication around the same time everyday  ?- Reviewed potential DDIs with patient  ?- Advised patient of medications to avoid (NSAIDs, ASA)  ?- Educated that Tylenol (acetaminophen) will be the preferred analgesic to prevent risk of bleeding  ?- Emphasized importance of monitoring for signs and symptoms of bleeding (abnormal bruising, prolonged bleeding, nose bleeds, bleeding from gums, discolored urine, black tarry stools)  ?- Advised patient to alert all providers of anticoagulation therapy prior to starting a new medication or having a procedure  ? ? ?Follow-up Appointments: ? ?PCP Hospital f/u appt confirmed? Scheduled to see Dr. Jenny Reichmann on 03/28/22  ? ?[facility] f/u appt confirmed? Scheduled to see Dr. Kayleen Memos on 04/11/22 @ 10AM.  ? ?If their condition worsens, is the pt aware to call PCP or go to the Emergency Dept.? Yes ? ?Final Patient Assessment: ?Patient has f/u scheduled and knows to get refills at f/u ? ?

## 2022-03-28 NOTE — Progress Notes (Signed)
Patient ID: Darrell Hora., male   DOB: [birth year], 71 y.o.   MRN: [id number] ? ? ? ?    Chief Complaint: follow up post hospn apr 2 - apr 5 ? ?     HPI:  Darrell Hoffman. is a 71 y.o. male here after recent hospn with persistent n/v thought related to chronic marijuana abuse, started on protonix 40 qd after evidence of distal esophagitis on CT, f/u exam here concerning for supraumbilical hernia, f/u CT with gallbladder wall thickening and pericholecystic fluid without gallstones or biliary ductal dilation.  Apr 2 presented to ED with atrial flutter with RVR thought again releated to n/v due to cannabinoid abuse. Able to be transitioned to oral cardizem and eliquis.  Pt rate controlled and able for d/c, with f/u planned for afibi clinic apr 27.  Also with neuro f/u apr 24, and ortho next wk as well.  Today pt doing well, admits to ongoing cannabis use, walks with cane, no falls.  Pt denies chest pain, increased sob or doe, wheezing, orthopnea, PND, increased LE swelling, palpitations, dizziness or syncope.   Pt denies polydipsia, polyuria, or new focal neuro s/s.   No overt bleeding on the eliquis and states overall good med compliance.   Pt denies fever, wt loss, night sweats, loss of appetite, or other constitutional symptoms   No other new complaints without n/v today or other abd pain.   ?      ?Wt Readings from Last 3 Encounters:  ?03/28/22 162 lb (73.5 kg)  ?03/20/22 144 lb 6.4 oz (65.5 kg)  ?03/12/22 160 lb 9.6 oz (72.8 kg)  ? ?BP Readings from Last 3 Encounters:  ?03/28/22 122/80  ?03/20/22 107/77  ?03/12/22 (!) 152/78  ? ?      ?Past Medical History:  ?Diagnosis Date  ? ALLERGIC RHINITIS 10/03/2007  ? ANEMIA-B12 DEFICIENCY 12/04/2009  ? ANEMIA-IRON DEFICIENCY 11/27/2009  ? ANEMIA-NOS 10/08/2007  ? ASTHMA 10/03/2007  ? Asthma   ? B12 DEFICIENCY 10/06/2008  ? CKD (chronic kidney disease) stage 3, GFR 30-59 ml/min (HCC) 03/31/2018  ? COLONIC POLYPS, HX OF 10/03/2007  ? ERECTILE DYSFUNCTION 10/03/2007  ? GERD  10/03/2007  ? History of hiatal hernia   ? found on CXT in June 2021  ? HYPERLIPIDEMIA 10/03/2007  ? HYPERTENSION 10/03/2007  ? KNEE PAIN, RIGHT 10/08/2007  ? Morbid obesity ([place]) 10/03/2007  ? Neuromuscular disorder ([place])   ? per pt. never diagnosed  ? Substance abuse ([place])   ? marijuana daily  ? ?Past Surgical History:  ?Procedure Laterality Date  ? bilateral hydrocele repair    ? COLONOSCOPY  06/06/2016  ? POLYPECTOMY  01-03-2010  ? s/p left knee arthroscopy  2009  ? s/p TKR  03/2009  ? VASECTOMY    ? XI ROBOTIC ASSISTED HIATAL HERNIA REPAIR N/A 01/04/2021  ? Procedure: XI ROBOTIC ASSISTED HIATAL HERNIA REPAIR AND FUNDOPLICATION WITH MESH;  Surgeon: Ralene Ok, MD;  Location: [facility];  Service: General;  Laterality: N/A;  ? ? reports that he has never smoked. He has never used smokeless tobacco. He reports that he does not currently use alcohol. He reports current drug use. Drug: Marijuana. ?family history includes Alcohol abuse in his paternal uncle; Breast cancer in his mother; Coronary artery disease in an other family member; Diabetes in his cousin and sister; Hypertension in his sister; Lupus in his sister. ?No Known Allergies ?Current Outpatient Medications on File Prior to Visit  ?Medication Sig Dispense Refill  ?  acetaminophen (TYLENOL) 325 MG tablet Take 2 tablets (650 mg total) by mouth every 6 (six) hours as needed for mild pain (or Fever >/= 101). 20 tablet 0  ? apixaban (ELIQUIS) 5 MG TABS tablet Take 1 tablet (5 mg total) by mouth 2 (two) times daily. 60 tablet 0  ? CIALIS 20 MG tablet TAKE 1 TABLET BY MOUTH DAILY AS NEEDED (Patient taking differently: Take 20 mg by mouth daily as needed for erectile dysfunction.) 5 tablet 11  ? diltiazem (TIAZAC) 360 MG 24 hr capsule Take 1 capsule (360 mg total) by mouth daily. 30 capsule 0  ? gabapentin (NEURONTIN) 600 MG tablet TAKE 1 TABLET BY MOUTH FOUR TIMES DAILY 84 tablet 0  ? ondansetron (ZOFRAN-ODT) 4 MG disintegrating tablet Take 1 tablet (4 mg total)  by mouth every 8 (eight) hours as needed for nausea or vomiting. 40 tablet 02  ? pantoprazole (PROTONIX) 40 MG tablet Take 1 tablet (40 mg total) by mouth daily. 30 tablet 0  ? polyethylene glycol powder (GLYCOLAX/MIRALAX) 17 GM/SCOOP powder Take 17 g by mouth daily. 238 g 0  ? senna-docusate (SENOKOT-S) 8.6-50 MG tablet Take 1 tablet by mouth at bedtime. 30 tablet 0  ? ?No current facility-administered medications on file prior to visit.  ? ?     ROS:  All others reviewed and negative. ? ?Objective  ? ?     PE:  BP 122/80 (BP Location: Right Arm, Patient Position: Sitting, Cuff Size: Normal)   Pulse 80   Temp 98.8 ?F (37.1 ?C) (Oral)   Ht '5\' 6"'$  (1.676 m)   Wt 162 lb (73.5 kg)   SpO2 98%   BMI 26.15 kg/m?  ? ?              Constitutional: Pt appears in NAD, non toxic ?              HENT: Head: NCAT.  ?              Right Ear: External ear normal.   ?              Left Ear: External ear normal.  ?              Eyes: . Pupils are equal, round, and reactive to light. Conjunctivae and EOM are normal ?              Nose: without d/c or deformity ?              Neck: Neck supple. Gross normal ROM ?              Cardiovascular: Normal rate and irregular rhythm.   ?              Pulmonary/Chest: Effort normal and breath sounds without rales or wheezing.  ?              Abd:  Soft, NT, ND, + BS, no organomegaly ?              Neurological: Pt is alert. At baseline orientation, motor grossly intact ?              Skin: Skin is warm. No rashes, no other new lesions, LE edema - none ?              Psychiatric: Pt behavior is normal without agitation  ? ?Micro: none ? ?Cardiac tracings I have personally interpreted today:  none ? ?Pertinent Radiological findings (  summarize): none  ? ?Lab Results  ?Component Value Date  ? WBC 8.2 03/28/2022  ? HGB 10.3 (L) 03/28/2022  ? HCT 32.1 (L) 03/28/2022  ? PLT 338.0 03/28/2022  ? GLUCOSE 87 03/28/2022  ? CHOL 161 03/18/2022  ? TRIG 115 03/18/2022  ? HDL 45 03/18/2022  ? LDLDIRECT  71.0 06/07/2020  ? [place] 93 03/18/2022  ? ALT 16 03/28/2022  ? AST 18 03/28/2022  ? NA 138 03/28/2022  ? K 4.9 03/28/2022  ? CL 104 03/28/2022  ? CREATININE 1.02 03/28/2022  ? BUN 11 03/28/2022  ? CO2 27 03/28/2022  ? TSH 1.752 03/18/2022  ? PSA 0.32 02/04/2022  ? INR 2.5 (H) 04/23/2009  ? HGBA1C 5.7 12/11/2020  ? ?Assessment/Plan:  ?Darrell Hoffman. is a 71 y.o. Black or African American [2] male with  has a past medical history of ALLERGIC RHINITIS (10/03/2007), ANEMIA-B12 DEFICIENCY (12/04/2009), ANEMIA-IRON DEFICIENCY (11/27/2009), ANEMIA-NOS (10/08/2007), ASTHMA (10/03/2007), Asthma, B12 DEFICIENCY (10/06/2008), CKD (chronic kidney disease) stage 3, GFR 30-59 ml/min (HCC) (03/31/2018), COLONIC POLYPS, HX OF (10/03/2007), ERECTILE DYSFUNCTION (10/03/2007), GERD (10/03/2007), History of hiatal hernia, HYPERLIPIDEMIA (10/03/2007), HYPERTENSION (10/03/2007), KNEE PAIN, RIGHT (10/08/2007), Morbid obesity ([place]) (10/03/2007), Neuromuscular disorder ([place]), and Substance abuse ([place]). ? ?Atrial flutter ([place]) ?Rate and volume stable, cont cardizem, DOAC and f/u afib clinic as planned ? ?Intractable nausea and vomiting ?Resolved, advised to avoid further cannabis ? ?Hyperglycemia ?Lab Results  ?Component Value Date  ? HGBA1C 5.7 12/11/2020  ? ?Stable, pt to continue current medical treatment  - diet ? ? ?Essential hypertension ?BP Readings from Last 3 Encounters:  ?03/28/22 122/80  ?03/20/22 107/77  ?03/12/22 (!) 152/78  ? ?Stable, pt to continue medical treatment cardizem ? ? ?CKD (chronic kidney disease) stage 3, GFR 30-59 ml/min ?Lab Results  ?Component Value Date  ? CREATININE 1.02 03/28/2022  ? ?Stable overall, cont to avoid nephrotoxins ? ?Followup: Return in about 3 months (around 06/27/2022). ? ?Cathlean Cower, MD 04/01/2022 9:10 PM ?[place] ?[place] ?Internal Medicine ?

## 2022-03-28 NOTE — Patient Instructions (Addendum)
Please continue all other medications as before, and refills have been done if requested. ? ?Please have the pharmacy call with any other refills you may need. ? ?Please keep your appointments with your specialists as you may have planned ? ?Please go to the LAB at the blood drawing area for the tests to be done ? ?You will be contacted by phone if any changes need to be made immediately.  Otherwise, you will receive a letter about your results with an explanation, but please check with MyChart first. ? ?Please remember to sign up for MyChart if you have not done so, as this will be important to you in the future with finding out test results, communicating by private email, and scheduling acute appointments online when needed. ? ?Please make an Appointment to return in 3 months, or sooner if needed ?

## 2022-03-30 DIAGNOSIS — R404 Transient alteration of awareness: Secondary | ICD-10-CM | POA: Diagnosis not present

## 2022-03-30 DIAGNOSIS — R4020 Unspecified coma: Secondary | ICD-10-CM | POA: Diagnosis not present

## 2022-04-01 ENCOUNTER — Encounter: Payer: Self-pay | Admitting: Internal Medicine

## 2022-04-01 NOTE — Assessment & Plan Note (Signed)
Rate and volume stable, cont cardizem, DOAC and f/u afib clinic as planned ?

## 2022-04-01 NOTE — Assessment & Plan Note (Signed)
Lab Results  ?Component Value Date  ? HGBA1C 5.7 12/11/2020  ? ?Stable, pt to continue current medical treatment  - diet ? ?

## 2022-04-01 NOTE — Assessment & Plan Note (Signed)
Lab Results  ?Component Value Date  ? CREATININE 1.02 03/28/2022  ? ?Stable overall, cont to avoid nephrotoxins ?

## 2022-04-01 NOTE — Assessment & Plan Note (Signed)
Resolved, advised to avoid further cannabis ?

## 2022-04-01 NOTE — Assessment & Plan Note (Signed)
BP Readings from Last 3 Encounters:  ?03/28/22 122/80  ?03/20/22 107/77  ?03/12/22 (!) 152/78  ? ?Stable, pt to continue medical treatment cardizem ? ?

## 2022-04-02 ENCOUNTER — Telehealth: Payer: Self-pay | Admitting: Orthopaedic Surgery

## 2022-04-02 ENCOUNTER — Ambulatory Visit: Payer: Federal, State, Local not specified - PPO | Admitting: Orthopaedic Surgery

## 2022-04-02 NOTE — Telephone Encounter (Signed)
Girlfriend called to advise patient passed  ?

## 2022-04-05 NOTE — Progress Notes (Deleted)
NEUROLOGY FOLLOW UP OFFICE NOTE  Darrell Hoffman [id number]  Assessment/Plan:   1  Idiopathic polyneuropathy  2  Lumbosacral radiculopathy.   Subjective:  Darrell Hoffman is a 71 year old right-handed male with HTN, CKD, anemia, and history of alcohol abuse who follows up for neuropathy.   UPDATE: Taking gabapentin '600mg'$  four times daily.    Recently diagnosed with a flutter.  ***   HISTORY: He has history of lower extremity pain since late 2021 to early 2022. Workup for PAD was negative.  History of B12 deficiency on supplements with level from December 561.  Hgb A1c was 5.7 and TSH 2.11.  In August 2021, underwent labs for neuromuscular disorder - AChR antibody negative, CK was 251, aldolase 2.4, and sed rate 22.  Pain significantly worsened after hiatal hernia repair in January 2022.  Initially feet feel cold.  Pain in arches and in between sharp pain sharp in legs - wakes up in night - can't sleep - no numbness and tingling - OTC ointment helps short term and massage - not notice while on feet or walking.     Labs from 02/06/2021 demonstrated negative ANA, B12 313, ACE 18, B1 22, SPEP/IFE negative for M-spike.  NCV-EMG on 03/13/2021 showed active on chronic polyradiculopathy with predominantly axonal features and sural sparing of the lower extremities.  To evaluate for CIDP, underwent lumbar on 04/05/2021 which showed normal protein of 39, glucose 49, cell count 1, negative cytology, ACE 5, negative Lyme and negative culture.  Underwent 5 days of IV Solu-Medrol in June 2022 without improvement.  He also endorsed right leg pain.  Followed up with orthopedics.  MRI of lumbar spine on 08/02/2021 showed multilevel degenerative changes with moderate right neural foraminal stenosis at L2-3, moderate to severe left and moderate right neural foraminal stenosis at L4-5 and moderate bilateral neural foraminal stenosis at L5-S1.  ESI have been ineffective.  As CSF showed normal protein, I suspect he  has a polyneuropathy with underlying lumbosacral radiculopathy related to degenerative spine changes.    PAST MEDICAL HISTORY: Past Medical History:  Diagnosis Date   ALLERGIC RHINITIS 10/03/2007   ANEMIA-B12 DEFICIENCY 12/04/2009   ANEMIA-IRON DEFICIENCY 11/27/2009   ANEMIA-NOS 10/08/2007   ASTHMA 10/03/2007   Asthma    B12 DEFICIENCY 10/06/2008   CKD (chronic kidney disease) stage 3, GFR 30-59 ml/min (HCC) 03/31/2018   COLONIC POLYPS, HX OF 10/03/2007   ERECTILE DYSFUNCTION 10/03/2007   GERD 10/03/2007   History of hiatal hernia    found on CXT in June 2021   HYPERLIPIDEMIA 10/03/2007   HYPERTENSION 10/03/2007   KNEE PAIN, RIGHT 10/08/2007   Morbid obesity ([place]) 10/03/2007   Neuromuscular disorder ([place])    per pt. never diagnosed   Substance abuse ([place])    marijuana daily    MEDICATIONS: Current Outpatient Medications on File Prior to Visit  Medication Sig Dispense Refill   acetaminophen (TYLENOL) 325 MG tablet Take 2 tablets (650 mg total) by mouth every 6 (six) hours as needed for mild pain (or Fever >/= 101). 20 tablet 0   apixaban (ELIQUIS) 5 MG TABS tablet Take 1 tablet (5 mg total) by mouth 2 (two) times daily. 60 tablet 0   CIALIS 20 MG tablet TAKE 1 TABLET BY MOUTH DAILY AS NEEDED (Patient taking differently: Take 20 mg by mouth daily as needed for erectile dysfunction.) 5 tablet 11   diltiazem (TIAZAC) 360 MG 24 hr capsule Take 1 capsule (360 mg total) by  mouth daily. 30 capsule 0   gabapentin (NEURONTIN) 600 MG tablet TAKE 1 TABLET BY MOUTH FOUR TIMES DAILY 84 tablet 0   ondansetron (ZOFRAN-ODT) 4 MG disintegrating tablet Take 1 tablet (4 mg total) by mouth every 8 (eight) hours as needed for nausea or vomiting. 40 tablet 02   pantoprazole (PROTONIX) 40 MG tablet Take 1 tablet (40 mg total) by mouth daily. 30 tablet 0   polyethylene glycol powder (GLYCOLAX/MIRALAX) 17 GM/SCOOP powder Take 17 g by mouth daily. 238 g 0   senna-docusate (SENOKOT-S) 8.6-50 MG tablet  Take 1 tablet by mouth at bedtime. 30 tablet 0   No current facility-administered medications on file prior to visit.    ALLERGIES: No Known Allergies  FAMILY HISTORY: Family History  Problem Relation Age of Onset   Breast cancer Mother    Lupus Sister    Hypertension Sister    Diabetes Sister    Alcohol abuse Paternal Uncle    Coronary artery disease Other        male 1st degree relative - MI at 70 yo   Diabetes Cousin        2 cousins   Colon cancer Neg Hx    Esophageal cancer Neg Hx    Rectal cancer Neg Hx    Stomach cancer Neg Hx    Colon polyps Neg Hx       Objective:  *** General: No acute distress.  Patient appears ***-groomed.   Head:  Normocephalic/atraumatic Eyes:  Fundi examined but not visualized Neck: supple, no paraspinal tenderness, full range of motion Heart:  Regular rate and rhythm Lungs:  Clear to auscultation bilaterally Back: No paraspinal tenderness Neurological Exam: alert and oriented to person, place, and time.  Speech fluent and not dysarthric, language intact.  CN II-XII intact. Bulk and tone normal, muscle strength 5/5 throughout.  Sensation to light touch intact.  Deep tendon reflexes 2+ throughout, toes downgoing.  Finger to nose testing intact.  Gait normal, Romberg negative.   Metta Clines, DO  CC: ***

## 2022-04-08 ENCOUNTER — Ambulatory Visit: Payer: Federal, State, Local not specified - PPO | Admitting: Neurology

## 2022-04-08 ENCOUNTER — Encounter: Payer: Self-pay | Admitting: Neurology

## 2022-04-08 DIAGNOSIS — Z029 Encounter for administrative examinations, unspecified: Secondary | ICD-10-CM

## 2022-04-11 ENCOUNTER — Ambulatory Visit (HOSPITAL_COMMUNITY): Payer: Federal, State, Local not specified - PPO | Admitting: Nurse Practitioner

## 2022-04-15 DIAGNOSIS — 419620001 Death: Secondary | SNOMED CT | POA: Diagnosis not present

## 2022-04-15 DEATH — deceased

## 2022-06-24 ENCOUNTER — Ambulatory Visit: Payer: Federal, State, Local not specified - PPO | Admitting: Neurology

## 2022-06-27 ENCOUNTER — Ambulatory Visit: Payer: Federal, State, Local not specified - PPO | Admitting: Internal Medicine
